# Patient Record
Sex: Female | Born: 1935
Health system: Southern US, Community
[De-identification: ages and names within clinical notes are randomized; demographics above are authoritative.]

## PROBLEM LIST (undated history)

## (undated) DIAGNOSIS — E785 Hyperlipidemia, unspecified: Secondary | ICD-10-CM

## (undated) DIAGNOSIS — I4891 Unspecified atrial fibrillation: Secondary | ICD-10-CM

## (undated) DIAGNOSIS — E039 Hypothyroidism, unspecified: Secondary | ICD-10-CM

## (undated) DIAGNOSIS — F0390 Unspecified dementia without behavioral disturbance: Secondary | ICD-10-CM

## (undated) DIAGNOSIS — F419 Anxiety disorder, unspecified: Secondary | ICD-10-CM

## (undated) DIAGNOSIS — J449 Chronic obstructive pulmonary disease, unspecified: Secondary | ICD-10-CM

## (undated) DIAGNOSIS — I5030 Unspecified diastolic (congestive) heart failure: Secondary | ICD-10-CM

## (undated) DIAGNOSIS — I10 Essential (primary) hypertension: Secondary | ICD-10-CM

## (undated) DIAGNOSIS — F329 Major depressive disorder, single episode, unspecified: Secondary | ICD-10-CM

## (undated) DIAGNOSIS — F32A Depression, unspecified: Secondary | ICD-10-CM

## (undated) DIAGNOSIS — I739 Peripheral vascular disease, unspecified: Secondary | ICD-10-CM

## (undated) DIAGNOSIS — I2510 Atherosclerotic heart disease of native coronary artery without angina pectoris: Secondary | ICD-10-CM

## (undated) HISTORY — DX: Unspecified atrial fibrillation: I48.91

## (undated) HISTORY — DX: Hypothyroidism, unspecified: E03.9

## (undated) HISTORY — DX: Essential (primary) hypertension: I10

## (undated) HISTORY — PX: ABDOMINAL HYSTERECTOMY: SHX81

## (undated) HISTORY — DX: Hyperlipidemia, unspecified: E78.5

## (undated) HISTORY — DX: Peripheral vascular disease, unspecified: I73.9

## (undated) HISTORY — DX: Anxiety disorder, unspecified: F41.9

## (undated) HISTORY — PX: CARDIAC SURGERY: SHX584

## (undated) HISTORY — PX: CARDIOVERSION: SHX1299

---

## 1983-06-07 HISTORY — PX: CORONARY ARTERY BYPASS GRAFT: SHX141

## 1983-06-07 HISTORY — PX: FEMORAL ARTERY STENT: SHX1583

## 2004-08-19 ENCOUNTER — Ambulatory Visit: Payer: Self-pay | Admitting: Vascular Surgery

## 2004-08-31 ENCOUNTER — Ambulatory Visit: Payer: Self-pay | Admitting: Vascular Surgery

## 2004-09-08 ENCOUNTER — Ambulatory Visit: Payer: Self-pay | Admitting: Vascular Surgery

## 2007-07-05 ENCOUNTER — Ambulatory Visit: Payer: Self-pay | Admitting: Gastroenterology

## 2008-04-16 ENCOUNTER — Encounter: Admission: RE | Admit: 2008-04-16 | Discharge: 2008-04-16 | Payer: Self-pay | Admitting: Cardiology

## 2010-06-06 HISTORY — PX: CATARACT EXTRACTION: SUR2

## 2011-06-20 ENCOUNTER — Ambulatory Visit: Payer: Self-pay | Admitting: Gastroenterology

## 2012-11-08 ENCOUNTER — Encounter: Payer: Self-pay | Admitting: Cardiothoracic Surgery

## 2012-11-08 ENCOUNTER — Encounter: Payer: Self-pay | Admitting: Nurse Practitioner

## 2012-11-26 ENCOUNTER — Ambulatory Visit: Payer: Self-pay | Admitting: Vascular Surgery

## 2012-11-26 LAB — CREATININE, SERUM: EGFR (African American): 59 — ABNORMAL LOW

## 2012-12-04 ENCOUNTER — Encounter: Payer: Self-pay | Admitting: Nurse Practitioner

## 2012-12-04 ENCOUNTER — Encounter: Payer: Self-pay | Admitting: Cardiothoracic Surgery

## 2013-09-30 ENCOUNTER — Observation Stay: Payer: Self-pay | Admitting: Internal Medicine

## 2013-09-30 LAB — CBC
HCT: 39.1 % (ref 35.0–47.0)
HGB: 13.3 g/dL (ref 12.0–16.0)
MCH: 31.9 pg (ref 26.0–34.0)
MCHC: 33.9 g/dL (ref 32.0–36.0)
MCV: 94 fL (ref 80–100)
PLATELETS: 230 10*3/uL (ref 150–440)
RBC: 4.16 10*6/uL (ref 3.80–5.20)
RDW: 13.2 % (ref 11.5–14.5)
WBC: 8.6 10*3/uL (ref 3.6–11.0)

## 2013-09-30 LAB — CK-MB: CK-MB: 1.7 ng/mL (ref 0.5–3.6)

## 2013-09-30 LAB — BASIC METABOLIC PANEL
Anion Gap: 8 (ref 7–16)
BUN: 18 mg/dL (ref 7–18)
Calcium, Total: 9.1 mg/dL (ref 8.5–10.1)
Chloride: 95 mmol/L — ABNORMAL LOW (ref 98–107)
Co2: 26 mmol/L (ref 21–32)
Creatinine: 0.9 mg/dL (ref 0.60–1.30)
EGFR (Non-African Amer.): 60 — ABNORMAL LOW
GLUCOSE: 103 mg/dL — AB (ref 65–99)
OSMOLALITY: 261 (ref 275–301)
Potassium: 4.5 mmol/L (ref 3.5–5.1)
Sodium: 129 mmol/L — ABNORMAL LOW (ref 136–145)

## 2013-09-30 LAB — TROPONIN I

## 2013-10-01 LAB — CBC WITH DIFFERENTIAL/PLATELET
Basophil #: 0 10*3/uL (ref 0.0–0.1)
Basophil %: 0.5 %
Eosinophil #: 0.1 10*3/uL (ref 0.0–0.7)
Eosinophil %: 0.9 %
HCT: 35.5 % (ref 35.0–47.0)
HGB: 11.9 g/dL — AB (ref 12.0–16.0)
LYMPHS ABS: 1.3 10*3/uL (ref 1.0–3.6)
Lymphocyte %: 20 %
MCH: 31.3 pg (ref 26.0–34.0)
MCHC: 33.7 g/dL (ref 32.0–36.0)
MCV: 93 fL (ref 80–100)
MONO ABS: 1 x10 3/mm — AB (ref 0.2–0.9)
MONOS PCT: 14.6 %
Neutrophil #: 4.3 10*3/uL (ref 1.4–6.5)
Neutrophil %: 64 %
PLATELETS: 177 10*3/uL (ref 150–440)
RBC: 3.81 10*6/uL (ref 3.80–5.20)
RDW: 12.6 % (ref 11.5–14.5)
WBC: 6.7 10*3/uL (ref 3.6–11.0)

## 2013-10-01 LAB — BASIC METABOLIC PANEL
Anion Gap: 4 — ABNORMAL LOW (ref 7–16)
BUN: 15 mg/dL (ref 7–18)
CALCIUM: 8.8 mg/dL (ref 8.5–10.1)
CHLORIDE: 100 mmol/L (ref 98–107)
CO2: 30 mmol/L (ref 21–32)
Creatinine: 0.8 mg/dL (ref 0.60–1.30)
EGFR (African American): >60
EGFR (Non-African Amer.): >60
Glucose: 111 mg/dL — ABNORMAL HIGH (ref 65–99)
OSMOLALITY: 270 (ref 275–301)
Potassium: 4.4 mmol/L (ref 3.5–5.1)
SODIUM: 134 mmol/L — AB (ref 136–145)

## 2013-10-01 LAB — CK-MB: CK-MB: 1.8 ng/mL (ref 0.5–3.6)

## 2013-10-01 LAB — TROPONIN I

## 2013-10-05 DIAGNOSIS — I10 Essential (primary) hypertension: Secondary | ICD-10-CM

## 2013-10-05 DIAGNOSIS — J449 Chronic obstructive pulmonary disease, unspecified: Secondary | ICD-10-CM

## 2013-10-05 DIAGNOSIS — I739 Peripheral vascular disease, unspecified: Secondary | ICD-10-CM

## 2013-10-05 DIAGNOSIS — I2510 Atherosclerotic heart disease of native coronary artery without angina pectoris: Secondary | ICD-10-CM

## 2013-10-05 HISTORY — DX: Essential (primary) hypertension: I10

## 2013-10-05 HISTORY — DX: Chronic obstructive pulmonary disease, unspecified: J44.9

## 2013-10-05 HISTORY — DX: Peripheral vascular disease, unspecified: I73.9

## 2013-10-05 HISTORY — DX: Atherosclerotic heart disease of native coronary artery without angina pectoris: I25.10

## 2014-07-28 DIAGNOSIS — J42 Unspecified chronic bronchitis: Secondary | ICD-10-CM | POA: Diagnosis not present

## 2014-07-28 DIAGNOSIS — E782 Mixed hyperlipidemia: Secondary | ICD-10-CM | POA: Diagnosis not present

## 2014-07-28 DIAGNOSIS — I25810 Atherosclerosis of coronary artery bypass graft(s) without angina pectoris: Secondary | ICD-10-CM | POA: Diagnosis not present

## 2014-08-11 DIAGNOSIS — E782 Mixed hyperlipidemia: Secondary | ICD-10-CM | POA: Diagnosis not present

## 2014-08-11 DIAGNOSIS — I10 Essential (primary) hypertension: Secondary | ICD-10-CM | POA: Diagnosis not present

## 2014-08-11 DIAGNOSIS — I739 Peripheral vascular disease, unspecified: Secondary | ICD-10-CM | POA: Diagnosis not present

## 2014-08-11 DIAGNOSIS — I25810 Atherosclerosis of coronary artery bypass graft(s) without angina pectoris: Secondary | ICD-10-CM | POA: Diagnosis not present

## 2014-09-26 NOTE — Op Note (Signed)
PATIENT NAME:  Holly Clark, Holly Clark MR#:  [id number] DATE OF BIRTH:  11-29-1929  DATE OF PROCEDURE:  11/26/2012  PREOPERATIVE DIAGNOSES:  1. Peripheral arterial disease with ulceration, right lower extremity.  2. Status post bilateral iliac interventions previously.  3. Hyperlipidemia.  4. Carotid artery stenosis.   POSTOPERATIVE DIAGNOSES:  1. Peripheral arterial disease with ulceration, right lower extremity.  2. Status post bilateral iliac interventions previously.  3. Hyperlipidemia.  4. Carotid artery stenosis.   PROCEDURE: 1. Ultrasound guidance for vascular access, left femoral artery.  2. Catheter placement into right popliteal artery from left femoral approach.  3. Aortogram and selective right lower extremity angiogram.  4. Percutaneous transluminal angioplasty of mid to distal superficial femoral artery down to the popliteal artery just above the knee with 4 mm diameter angioplasty balloon.  5. Self-expanding stent placement x2 for greater than 50% residual stenosis and dissection in the distal superficial femoral artery and above-knee popliteal artery.  6. StarClose closure device, left femoral artery.   SURGEON: Algernon Huxley, MD   ANESTHESIA: Local with moderate conscious sedation.   ESTIMATED BLOOD LOSS: 25 mL.   INDICATION FOR PROCEDURE: This is an 81 year old white female with severe peripheral vascular disease. She has undergone previous intervention. She has a nonhealing ulcer of the right lower extremity and right SFA stenosis. She is brought in for angiogram for further evaluation. Risks and benefits were discussed. Informed consent was obtained.   DESCRIPTION OF THE PROCEDURE: The patient is brought to the vascular interventional radiology suite. Groins were shaved and prepped, and a sterile surgical field was created. The left femoral head was localized with fluoroscopy. Ultrasound was used to access a patent left femoral artery, and a 5 French sheath was placed.  Pigtail catheter was placed in the aorta at the L1-L2 level, and AP aortogram was performed. This showed what appeared to be patent renal arteries. The aorta was mildly ectatic and maybe even slightly aneurysmal in the mid to distal portion. At the common iliac arteries bilaterally, there was stenosis that was in the mild-to-moderate range, approaching 50%, but did not appear flow limiting, and she had triphasic flow on her duplex in the iliacs previously. Her iliac stents that were below this were patent. Hooked the aortic bifurcation with a RIM catheter and advanced with a Terumo Advantage wire to the right femoral head, and selective right lower extremity angiogram was then performed. This showed an occlusion of the distal superficial femoral artery. She reconstituted an above-knee popliteal artery. There was disease both above and below this area for several centimeters. She then had what appeared to be 2-vessel runoff distally. A 6 French Ansel sheath was placed over a Terumo Advantage wire. I was able to cross the lesion without difficulty with the Terumo Advantage wire and a Kumpe catheter and confirm intraluminal flow in the popliteal artery. I then replaced the wire. She had small vessels, and a 4 mm diameter angioplasty balloon was inflated from just above the knee back up into the mid to distal superficial femoral artery, encompassing the area of occlusion and stenosis. Waists were taken which resolved. Completion angiogram following angioplasty showed significant residual stenosis and dissection throughout most of her angioplasty plane. For this reason, self-expanding stents were placed. We had used a 5 mm diameter by 12 cm length stent started just above the knee, and a second 5 mm diameter by 10 cm length stent was taken up to the mid to distal superficial femoral artery. This were postdilated  with a 4 mm balloon with an excellent angiographic completion result and maintained runoff distally. At this  point, I elected to terminate the procedure. The sheath was pulled back to the ipsilateral external iliac arteriogram, was and oblique arteriogram was performed. StarClose closure device was deployed in the usual fashion with excellent hemostatic result. The patient tolerated the procedure well and was taken to the recovery room in stable condition.   ____________________________ Algernon Huxley, MD jsd:OSi D: 11/26/2012 16:00:49 ET T: 11/27/2012 05:46:56 ET JOB#: [id number]  cc: Algernon Huxley, MD, <Dictator> Algernon Huxley MD ELECTRONICALLY SIGNED 11/29/2012 14:44

## 2014-09-27 NOTE — H&P (Signed)
PATIENT NAME:  Holly Clark, Holly Clark MR#:  [id number] DATE OF BIRTH:  07-23-1929  DATE OF ADMISSION:  09/30/2013  REFERRING PHYSICIAN: Dr. Jacqualine Code.   FAMILY PHYSICIAN: Dr. Emily Filbert.   REASON FOR ADMISSION: Chest pain, worrisome for unstable angina.   HISTORY OF PRESENT ILLNESS: The patient is an 81 year old female with history of coronary artery disease, status post CABG, benign hypertension, hyperlipidemia and COPD.  Also has peripheral vascular disease with lower extremity claudication. Presents to the Emergency Room with a 2 to 3 episodes of chest pain described as pressure and fullness associated with shortness of breath. Pain was improved with nitro. Came to the Emergency Room after her third episode. In the Emergency Room, the patient's initial cardiac enzymes were unremarkable and her EKG was nondiagnostic. She is now admitted for further evaluation.   PAST MEDICAL HISTORY: 1.  ASCVD, status post CABG.  2.  Benign hypertension.  3.  Hyperlipidemia.  4.  Chronic obstructive pulmonary disease.  5.  Peripheral vascular disease with lower extremity claudication.  6.  Status post bilateral iliac stents.   MEDICATIONS: 1.  Lexapro 5 mg p.o. daily.  2.  Zocor 20 mg p.o. at bedtime.  3.  Cardizem CD 120 mg p.o. daily.  4.  Levoxyl 100 mcg p.o. daily.  5.  Aspirin 81 mg p.o. daily.  6.  Xanax 0.25 mg p.o. at bedtime p.r.n.  7.  Nitrostat 0.4 mg sublingually p.r.n. chest pain.  8.  Micardis 40 mg p.o. daily.   ALLERGIES: HYCODAN.   SOCIAL HISTORY: The patient is divorced with 3 children. Has a history of tobacco abuse, but no history of alcohol abuse.   FAMILY HISTORY: Positive for cancer, sepsis and Parkinson disease.   REVIEW OF SYSTEMS: As per HPI.   PHYSICAL EXAMINATION: GENERAL: The patient is in no acute distress.  VITAL SIGNS: Currently remarkable for a blood pressure of 195/95 with a heart rate of 77, respiratory rate of 20, temperature of 98.3, sat 95% on room air.  HEENT:  Normocephalic, atraumatic. Pupils equally round and reactive to light and accommodation. Extraocular movements are intact. Sclerae anicteric. Conjunctivae are clear.  Oropharynx is clear.  NECK: Supple without JVD. No adenopathy or thyromegaly is noted.  LUNGS: Clear to auscultation and percussion without wheezes, rales or rhonchi. No dullness. Respiratory effort is normal.  CARDIAC: Regular rate and rhythm with normal S1, S2. No significant rubs, murmurs or gallops. PMI is nondisplaced. Chest wall is nontender.  ABDOMEN: Soft, nontender with normoactive bowel sounds. No organomegaly or masses were appreciated. No hernias or bruits were noted.  EXTREMITIES: Without clubbing, cyanosis, edema. Pulses were 1+ bilaterally.  SKIN: Warm and dry without rash or lesions.  NEUROLOGIC: Cranial nerves II through XII grossly intact. Deep tendon reflexes were symmetric. Motor and sensory exam is nonfocal.  PSYCHIATRIC: Revealed a patient who is alert and oriented to person, place and time. She was cooperative and used good judgment.   LABORATORY DATA: EKG revealed sinus rhythm with a right bundle branch block, but no acute ischemic changes. Chest x-ray revealed COPD, but was otherwise unremarkable. CBC was within normal limits. Her troponin was less than 0.02 with a glucose of 103 and a BUN of 18 and a creatinine of 0.9 with a sodium of 129.   ASSESSMENT: 1.  Chest pain, worrisome for unstable angina.  2.  Hyponatremia.  3.  Benign hypertension.  4.  Chronic obstructive pulmonary disease.  5.  Hyperlipidemia.  6.  Peripheral vascular disease.  PLAN: The patient will be observed on telemetry with Lovenox, aspirin and p.r.n. Zofran and morphine. We will begin oral nitrates at this time. Continue her other cardiac meds. We will follow serial cardiac enzymes and obtain an echocardiogram. We will consult cardiology for unstable angina. We will also consult vascular surgery for her peripheral vascular disease and  lower extremity claudication. Clear liquid diet for now, as the patient may require cardiac catheterization tomorrow. We will begin IV normal saline and follow up her sodium in the morning.   Further treatment and evaluation will depend upon the patient's progress.   Total spent on this patient was 50 minutes.     ____________________________ Leonie Douglas Doy Hutching, MD jds:dmm D: 09/30/2013 19:59:22 ET T: 09/30/2013 20:18:46 ET JOB#: [id number]  cc: Leonie Douglas. Doy Hutching, MD, <Dictator> miller JEFFREY Lennice Sites MD ELECTRONICALLY SIGNED 10/02/2013 8:03

## 2014-09-27 NOTE — Discharge Summary (Signed)
PATIENT NAME:  Holly Clark, Holly Clark MR#:  [id number] DATE OF BIRTH:  1929-08-19  DATE OF ADMISSION:  09/30/2013 DATE OF DISCHARGE:  10/01/2013  DISCHARGE DIAGNOSES:  1.  Atypical chest pain.  2.  Hypertension.  3.  Chronic obstructive pulmonary disease. 4.  Peripheral vascular disease.  5.  Hyponatremia.  DISCHARGE MEDICATIONS: Lexapro 5 mg daily, Zocor 20 mg at bedtime, Cardizem CD 120 mg daily, Synthroid 100 mcg daily, aspirin 81 mg daily, Xanax 0.5 mg at bedtime, Micardis 40 mg daily.   REASON FOR ADMISSION: An 81 year old female presents with chest pain.   HOSPITAL COURSE: The patient was admitted. Enzymes were negative. With atypical chest pain and known coronary artery disease, she underwent stress Myoview testing, which showed no signs of ischemia. She was asymptomatic without chest pain. Her sodium improved to 134 with hydration. She will be discharged on her usual medications to follow up with Dr. Emily Filbert. The etiology of her chest pain is unclear, it certainly could be pulmonary or musculoskeletal. She had no significant hypoxia really or dyspnea before or after hospitalization. Overall prognosis is guarded.  ____________________________ Rusty Aus, MD mfm:aw D: 10/02/2013 07:29:19 ET T: 10/02/2013 07:34:22 ET JOB#: [id number]  cc: Rusty Aus, MD, <Dictator> Alekxander Isola Roselee Culver MD ELECTRONICALLY SIGNED 10/02/2013 7:59

## 2015-05-18 ENCOUNTER — Emergency Department: Payer: Medicare Other

## 2015-05-18 ENCOUNTER — Encounter: Payer: Self-pay | Admitting: Emergency Medicine

## 2015-05-18 ENCOUNTER — Inpatient Hospital Stay
Admission: EM | Admit: 2015-05-18 | Discharge: 2015-05-22 | DRG: 480 | Disposition: A | Discharge status: Skilled Nursing Facility | Payer: Medicare Other | Attending: Internal Medicine | Admitting: Internal Medicine

## 2015-05-18 DIAGNOSIS — J449 Chronic obstructive pulmonary disease, unspecified: Secondary | ICD-10-CM | POA: Diagnosis present

## 2015-05-18 DIAGNOSIS — K5900 Constipation, unspecified: Secondary | ICD-10-CM | POA: Diagnosis present

## 2015-05-18 DIAGNOSIS — N170 Acute kidney failure with tubular necrosis: Secondary | ICD-10-CM | POA: Diagnosis not present

## 2015-05-18 DIAGNOSIS — Z7982 Long term (current) use of aspirin: Secondary | ICD-10-CM | POA: Diagnosis not present

## 2015-05-18 DIAGNOSIS — F17210 Nicotine dependence, cigarettes, uncomplicated: Secondary | ICD-10-CM | POA: Diagnosis present

## 2015-05-18 DIAGNOSIS — F329 Major depressive disorder, single episode, unspecified: Secondary | ICD-10-CM | POA: Diagnosis present

## 2015-05-18 DIAGNOSIS — S72001A Fracture of unspecified part of neck of right femur, initial encounter for closed fracture: Secondary | ICD-10-CM | POA: Diagnosis present

## 2015-05-18 DIAGNOSIS — J440 Chronic obstructive pulmonary disease with acute lower respiratory infection: Secondary | ICD-10-CM | POA: Diagnosis present

## 2015-05-18 DIAGNOSIS — Z79899 Other long term (current) drug therapy: Secondary | ICD-10-CM

## 2015-05-18 DIAGNOSIS — E039 Hypothyroidism, unspecified: Secondary | ICD-10-CM | POA: Diagnosis present

## 2015-05-18 DIAGNOSIS — R0902 Hypoxemia: Secondary | ICD-10-CM | POA: Diagnosis present

## 2015-05-18 DIAGNOSIS — F0390 Unspecified dementia without behavioral disturbance: Secondary | ICD-10-CM | POA: Diagnosis present

## 2015-05-18 DIAGNOSIS — Z23 Encounter for immunization: Secondary | ICD-10-CM

## 2015-05-18 DIAGNOSIS — W19XXXA Unspecified fall, initial encounter: Secondary | ICD-10-CM | POA: Diagnosis present

## 2015-05-18 DIAGNOSIS — Z951 Presence of aortocoronary bypass graft: Secondary | ICD-10-CM

## 2015-05-18 DIAGNOSIS — I959 Hypotension, unspecified: Secondary | ICD-10-CM | POA: Diagnosis not present

## 2015-05-18 DIAGNOSIS — I2510 Atherosclerotic heart disease of native coronary artery without angina pectoris: Secondary | ICD-10-CM | POA: Diagnosis present

## 2015-05-18 DIAGNOSIS — I10 Essential (primary) hypertension: Secondary | ICD-10-CM | POA: Diagnosis present

## 2015-05-18 DIAGNOSIS — Z681 Body mass index (BMI) 19 or less, adult: Secondary | ICD-10-CM

## 2015-05-18 DIAGNOSIS — S0083XA Contusion of other part of head, initial encounter: Secondary | ICD-10-CM | POA: Diagnosis present

## 2015-05-18 DIAGNOSIS — Z419 Encounter for procedure for purposes other than remedying health state, unspecified: Secondary | ICD-10-CM

## 2015-05-18 DIAGNOSIS — E871 Hypo-osmolality and hyponatremia: Secondary | ICD-10-CM | POA: Diagnosis present

## 2015-05-18 DIAGNOSIS — J209 Acute bronchitis, unspecified: Secondary | ICD-10-CM | POA: Diagnosis not present

## 2015-05-18 DIAGNOSIS — E785 Hyperlipidemia, unspecified: Secondary | ICD-10-CM | POA: Diagnosis present

## 2015-05-18 DIAGNOSIS — I739 Peripheral vascular disease, unspecified: Secondary | ICD-10-CM | POA: Diagnosis present

## 2015-05-18 DIAGNOSIS — R0602 Shortness of breath: Secondary | ICD-10-CM

## 2015-05-18 DIAGNOSIS — E860 Dehydration: Secondary | ICD-10-CM | POA: Diagnosis present

## 2015-05-18 HISTORY — DX: Chronic obstructive pulmonary disease, unspecified: J44.9

## 2015-05-18 HISTORY — DX: Major depressive disorder, single episode, unspecified: F32.9

## 2015-05-18 HISTORY — DX: Unspecified dementia, unspecified severity, without behavioral disturbance, psychotic disturbance, mood disturbance, and anxiety: F03.90

## 2015-05-18 HISTORY — DX: Hypothyroidism, unspecified: E03.9

## 2015-05-18 HISTORY — DX: Depression, unspecified: F32.A

## 2015-05-18 HISTORY — DX: Atherosclerotic heart disease of native coronary artery without angina pectoris: I25.10

## 2015-05-18 LAB — URINALYSIS COMPLETE WITH MICROSCOPIC (ARMC ONLY)
BACTERIA UA: NONE SEEN
Bilirubin Urine: NEGATIVE
Glucose, UA: NEGATIVE mg/dL
Leukocytes, UA: NEGATIVE
NITRITE: NEGATIVE
PH: 5 (ref 5.0–8.0)
PROTEIN: NEGATIVE mg/dL
SPECIFIC GRAVITY, URINE: 1.011 (ref 1.005–1.030)
Squamous Epithelial / LPF: NONE SEEN

## 2015-05-18 LAB — CBC WITH DIFFERENTIAL/PLATELET
BASOS ABS: 0 10*3/uL (ref 0–0.1)
BASOS PCT: 0 %
Eosinophils Absolute: 0.1 10*3/uL (ref 0–0.7)
Eosinophils Relative: 1 %
HEMATOCRIT: 38.2 % (ref 35.0–47.0)
Hemoglobin: 12.8 g/dL (ref 12.0–16.0)
LYMPHS PCT: 6 %
Lymphs Abs: 0.5 10*3/uL — ABNORMAL LOW (ref 1.0–3.6)
MCH: 30.8 pg (ref 26.0–34.0)
MCHC: 33.5 g/dL (ref 32.0–36.0)
MCV: 91.8 fL (ref 80.0–100.0)
MONO ABS: 1.4 10*3/uL — AB (ref 0.2–0.9)
Monocytes Relative: 19 %
NEUTROS ABS: 5.1 10*3/uL (ref 1.4–6.5)
NEUTROS PCT: 74 %
Platelets: 167 10*3/uL (ref 150–440)
RBC: 4.16 MIL/uL (ref 3.80–5.20)
RDW: 13.6 % (ref 11.5–14.5)
WBC: 7.1 10*3/uL (ref 3.6–11.0)

## 2015-05-18 LAB — COMPREHENSIVE METABOLIC PANEL
ALBUMIN: 4.1 g/dL (ref 3.5–5.0)
ALK PHOS: 98 U/L (ref 38–126)
ALT: 21 U/L (ref 14–54)
ANION GAP: 11 (ref 5–15)
AST: 28 U/L (ref 15–41)
BUN: 30 mg/dL — ABNORMAL HIGH (ref 6–20)
CALCIUM: 9.2 mg/dL (ref 8.9–10.3)
CO2: 24 mmol/L (ref 22–32)
Chloride: 96 mmol/L — ABNORMAL LOW (ref 101–111)
Creatinine, Ser: 1.5 mg/dL — ABNORMAL HIGH (ref 0.44–1.00)
GFR calc non Af Amer: 31 mL/min — ABNORMAL LOW (ref >60)
GFR, EST AFRICAN AMERICAN: 36 mL/min — AB (ref >60)
GLUCOSE: 122 mg/dL — AB (ref 65–99)
POTASSIUM: 4.7 mmol/L (ref 3.5–5.1)
Sodium: 131 mmol/L — ABNORMAL LOW (ref 135–145)
Total Bilirubin: 0.8 mg/dL (ref 0.3–1.2)
Total Protein: 7.3 g/dL (ref 6.5–8.1)

## 2015-05-18 LAB — SURGICAL PCR SCREEN
MRSA, PCR: NEGATIVE
STAPHYLOCOCCUS AUREUS: NEGATIVE

## 2015-05-18 LAB — PROTIME-INR
INR: 1
Prothrombin Time: 13.4 seconds (ref 11.4–15.0)

## 2015-05-18 MED ORDER — LEVOTHYROXINE SODIUM 50 MCG PO TABS
100.0000 ug | ORAL_TABLET | Freq: Every day | ORAL | Status: DC
  Administered 2015-05-21: 100 ug via ORAL
  Filled 2015-05-18: qty 2

## 2015-05-18 MED ORDER — UMECLIDINIUM-VILANTEROL 62.5-25 MCG/INH IN AEPB: 1.0000 | INHALATION_SPRAY | Freq: Every day | RESPIRATORY_TRACT | Status: DC

## 2015-05-18 MED ORDER — INFLUENZA VAC SPLIT QUAD 0.5 ML IM SUSY
0.5000 mL | PREFILLED_SYRINGE | INTRAMUSCULAR | Status: AC
  Administered 2015-05-20: 0.5 mL via INTRAMUSCULAR
  Filled 2015-05-18: qty 0.5

## 2015-05-18 MED ORDER — MOMETASONE FURO-FORMOTEROL FUM 100-5 MCG/ACT IN AERO
2.0000 | INHALATION_SPRAY | Freq: Two times a day (BID) | RESPIRATORY_TRACT | Status: DC
  Administered 2015-05-18 – 2015-05-22 (×7): 2 via RESPIRATORY_TRACT
  Filled 2015-05-18: qty 8.8

## 2015-05-18 MED ORDER — NITROGLYCERIN 0.4 MG SL SUBL: 0.4000 mg | SUBLINGUAL_TABLET | SUBLINGUAL | Status: DC | PRN

## 2015-05-18 MED ORDER — BUPROPION HCL 75 MG PO TABS
75.0000 mg | ORAL_TABLET | Freq: Every day | ORAL | Status: DC
  Administered 2015-05-20 – 2015-05-22 (×3): 75 mg via ORAL
  Filled 2015-05-18 (×4): qty 1

## 2015-05-18 MED ORDER — DONEPEZIL HCL 5 MG PO TABS
5.0000 mg | ORAL_TABLET | Freq: Every day | ORAL | Status: DC
  Administered 2015-05-18 – 2015-05-21 (×4): 5 mg via ORAL
  Filled 2015-05-18 (×4): qty 1

## 2015-05-18 MED ORDER — MORPHINE SULFATE (PF) 2 MG/ML IV SOLN
2.0000 mg | INTRAVENOUS | Status: DC | PRN
  Administered 2015-05-19: 2 mg via INTRAVENOUS
  Filled 2015-05-18: qty 1

## 2015-05-18 MED ORDER — TIOTROPIUM BROMIDE MONOHYDRATE 18 MCG IN CAPS
18.0000 ug | ORAL_CAPSULE | Freq: Every day | RESPIRATORY_TRACT | Status: DC
  Administered 2015-05-18 – 2015-05-22 (×4): 18 ug via RESPIRATORY_TRACT
  Filled 2015-05-18: qty 5

## 2015-05-18 MED ORDER — ACETAMINOPHEN 325 MG PO TABS
650.0000 mg | ORAL_TABLET | Freq: Four times a day (QID) | ORAL | Status: DC | PRN
  Administered 2015-05-19 – 2015-05-21 (×2): 650 mg via ORAL
  Filled 2015-05-18 (×2): qty 2

## 2015-05-18 MED ORDER — CEFAZOLIN (ANCEF) 1 G IV SOLR
1.0000 g | INTRAVENOUS | Status: DC
  Filled 2015-05-18: qty 1

## 2015-05-18 MED ORDER — HYDROCODONE-ACETAMINOPHEN 5-325 MG PO TABS
1.0000 | ORAL_TABLET | Freq: Four times a day (QID) | ORAL | Status: DC | PRN
  Administered 2015-05-20 – 2015-05-21 (×4): 1 via ORAL
  Administered 2015-05-22 (×2): 2 via ORAL
  Filled 2015-05-18 (×2): qty 1
  Filled 2015-05-18: qty 2
  Filled 2015-05-18 (×2): qty 1
  Filled 2015-05-18: qty 2

## 2015-05-18 MED ORDER — ACETAMINOPHEN 650 MG RE SUPP
650.0000 mg | Freq: Four times a day (QID) | RECTAL | Status: DC | PRN
Start: 2015-05-18 — End: 2015-05-22

## 2015-05-18 MED ORDER — DILTIAZEM HCL ER 120 MG PO CP24
120.0000 mg | ORAL_CAPSULE | Freq: Every day | ORAL | Status: DC
  Administered 2015-05-19 – 2015-05-22 (×3): 120 mg via ORAL
  Filled 2015-05-18 (×8): qty 1

## 2015-05-18 MED ORDER — SIMVASTATIN 20 MG PO TABS
20.0000 mg | ORAL_TABLET | Freq: Every day | ORAL | Status: DC
  Administered 2015-05-18: 20 mg via ORAL
  Filled 2015-05-18: qty 1

## 2015-05-18 MED ORDER — SODIUM CHLORIDE 0.9 % IV SOLN
INTRAVENOUS | Status: DC
Start: 2015-05-18 — End: 2015-05-20
  Administered 2015-05-18: 21:00:00 via INTRAVENOUS

## 2015-05-18 MED ORDER — IRBESARTAN 150 MG PO TABS
150.0000 mg | ORAL_TABLET | Freq: Every day | ORAL | Status: DC
  Administered 2015-05-19 – 2015-05-22 (×3): 150 mg via ORAL
  Filled 2015-05-18 (×4): qty 1

## 2015-05-18 NOTE — Consult Note (Signed)
81 year old white female fell 4 days ago at home landing on her knee. She been having difficulty walking since then but has been getting around her house to a degree. She came in to the emergency room with her family and was found to have an impacted femoral neck fracture and is being treated admitted for this. She has extensive peripheral vascular disease has multiple bypasses in the past. She is able to walk around her home normally till this incident. She does not get outside of her home very much.  Examination shows extensive ecchymosis on her face arms and right knee with small skin tears present as well. Her leg is not shortened or externally rotated. She is able flex extend her toes and sensation intact. I was unable to palpate pulses.  Radiographs show impacted femoral neck fracture with abnormal appearance to the bone probably related to osteoarthritis and osteopenia.  Impression: Right femoral neck fracture impacted and the patient was significant health problems.  Plan: Discussed treatment options with patient and family. I think with her peripheral vascular disease will and laboratory status is it had a preoperative level femoral neck pinning is probably preferable. If it can heal in this position she'll be better off than if she had a more extensive procedure with problems with wound healing as well as more extensive procedure with more blood loss. Also, with the fact that this is 61 days old and has not displaced pinning is likely to be successful despite her poor bone quality. The risks benefits and possible complications were discussed, in particular avascular necrosis also fixation and potential for subsequent surgery to revise to total hip if the surgery fails.

## 2015-05-18 NOTE — ED Notes (Signed)
Pt presents to ER with Fall that happened Thursday due to tripping over a cord and landed on concrete. Pt presents with facial bruising , right hip pain that she is unable to bear weigh on since then, and wounds on lower extremities.

## 2015-05-18 NOTE — ED Provider Notes (Signed)
CSN: [id number]     Arrival date & time 05/18/15  1515 History   First MD Initiated Contact with Patient 05/18/15 1525     Chief Complaint  Patient presents with  . Hip Injury  . Fall     (Consider location/radiation/quality/duration/timing/severity/associated sxs/prior Treatment) The history is provided by the patient.  Holly Clark is a 81 y.o. female hx of CAD s/p CABG, HL, here with fall. Patient was walking the dog and then she tripped over the cord 4 days ago. She landed on the right face as well as right hip. She states that she is unable to hear any weight on it since then. Her family noticed worsening bruising right face and chin. Brought her to PCP, who sent her for evaluation.     History reviewed. No pertinent past medical history. Past Surgical History  Procedure Laterality Date  . Cardiac surgery     History reviewed. No pertinent family history. Social History  Substance Use Topics  . Smoking status: Current Every Day Smoker    Types: Cigarettes  . Smokeless tobacco: None  . Alcohol Use: Yes   OB History    No data available     Review of Systems  Musculoskeletal:       R hip pain  Skin: Positive for color change and wound.  All other systems reviewed and are negative.     Allergies  Review of patient's allergies indicates no known allergies.  Home Medications   Prior to Admission medications   Medication Sig Start Date End Date Taking? Authorizing Provider  aspirin EC 81 MG tablet Take 81 mg by mouth daily.   Yes Historical Provider, MD  buPROPion (WELLBUTRIN) 75 MG tablet Take 75 mg by mouth daily.   Yes Historical Provider, MD  diltiazem (DILACOR XR) 120 MG 24 hr capsule Take 120 mg by mouth daily.   Yes Historical Provider, MD  donepezil (ARICEPT) 5 MG tablet Take 5 mg by mouth at bedtime.   Yes Historical Provider, MD  levothyroxine (SYNTHROID, LEVOTHROID) 100 MCG tablet Take 100 mcg by mouth daily before breakfast.   Yes Historical Provider,  MD  nitroGLYCERIN (NITROSTAT) 0.4 MG SL tablet Place 0.4 mg under the tongue every 5 (five) minutes as needed for chest pain.   Yes Historical Provider, MD  simvastatin (ZOCOR) 20 MG tablet Take 20 mg by mouth at bedtime.   Yes Historical Provider, MD  telmisartan (MICARDIS) 40 MG tablet Take 40 mg by mouth daily.   Yes Historical Provider, MD  Umeclidinium-Vilanterol (ANORO ELLIPTA) 62.5-25 MCG/INH AEPB Inhale 1 puff into the lungs daily.   Yes Historical Provider, MD   There were no vitals taken for this visit. Physical Exam  Constitutional: She is oriented to person, place, and time.  Chronically ill   HENT:  Head: Normocephalic.  Bruising around R eye, R chin   Eyes: Conjunctivae are normal. Pupils are equal, round, and reactive to light.  Neck: Normal range of motion. Neck supple.  Cardiovascular: Normal rate, regular rhythm and normal heart sounds.   Pulmonary/Chest: Effort normal and breath sounds normal. No respiratory distress. She has no wheezes. She has no rales.  Abdominal: Soft. Bowel sounds are normal. She exhibits no distension. There is no tenderness. There is no rebound.  Musculoskeletal:  R hip shortened, internally rotated. 2+ pulses. R knee with skin tears, minimally tender. R hand with abrasion, R 5th finger flexed (chronic), nontender   Neurological: She is alert and oriented to person,  place, and time.  Skin: Skin is warm and dry.  Psychiatric: She has a normal mood and affect. Her behavior is normal. Judgment and thought content normal.  Nursing note and vitals reviewed.   ED Course  Procedures (including critical care time) Labs Review Labs Reviewed  CBC WITH DIFFERENTIAL/PLATELET - Abnormal; Notable for the following:    Lymphs Abs 0.5 (*)    Monocytes Absolute 1.4 (*)    All other components within normal limits  COMPREHENSIVE METABOLIC PANEL - Abnormal; Notable for the following:    Sodium 131 (*)    Chloride 96 (*)    Glucose, Bld 122 (*)    BUN 30  (*)    Creatinine, Ser 1.50 (*)    GFR calc non Af Amer 31 (*)    GFR calc Af Amer 36 (*)    All other components within normal limits  PROTIME-INR  URINALYSIS COMPLETEWITH MICROSCOPIC ([facility] ONLY)  TYPE AND SCREEN    Imaging Review Dg Chest 1 View  05/18/2015  CLINICAL DATA:  Right hip pain since a fall four days ago. Right hip fracture. EXAM: CHEST 1 VIEW COMPARISON:  09/30/2013 FINDINGS: Heart size and pulmonary vascularity are normal and the lungs are clear. Prior CABG. Aortic atherosclerosis. Moderate hiatal hernia. No acute abnormalities. IMPRESSION: No acute abnormalities. Moderate hiatal hernia. Aortic atherosclerosis. Electronically Signed   By: Lorriane Shire M.D.   On: 05/18/2015 16:01   Ct Head Wo Contrast  05/18/2015  CLINICAL DATA:  Status post fall.  Facial bruising. EXAM: CT HEAD WITHOUT CONTRAST CT MAXILLOFACIAL WITHOUT CONTRAST CT CERVICAL SPINE WITHOUT CONTRAST TECHNIQUE: Multidetector CT imaging of the head, cervical spine, and maxillofacial structures were performed using the standard protocol without intravenous contrast. Multiplanar CT image reconstructions of the cervical spine and maxillofacial structures were also generated. COMPARISON:  None. FINDINGS: CT HEAD FINDINGS There is low attenuation within the subcortical and periventricular white matter compatible with chronic microvascular disease. Prominence of the sulci and ventricles identified consistent with brain atrophy. There is no evidence for acute cortical infarct, intracranial hemorrhage or mass. The paranasal sinuses and the mastoid air cells appear clear. The calvarium is intact. CT MAXILLOFACIAL FINDINGS The paranasal sinuses are clear. The nasal bone is intact. The zygomatic arches are intact. Mandible is located and intact. No evidence for orbital fracture. CT CERVICAL SPINE FINDINGS There is straightening of normal cervical lordosis. The vertebral body heights are well preserved. The prevertebral soft tissue  space appears normal. Multi level disc space narrowing and ventral endplate spurring is noted this is most advanced at C4-5 and C5-6. The facet joints appear all well aligned. No fractures or subluxations. Calcified left cervical lymph nodes identified. IMPRESSION: 1. No acute intracranial abnormalities. 2. Small vessel ischemic disease and brain atrophy. 3. No evidence for facial bone fracture. 4. No evidence for cervical spine fracture. 5. Cervical spondylosis. Electronically Signed   By: Kerby Moors M.D.   On: 05/18/2015 16:46   Ct Cervical Spine Wo Contrast  05/18/2015  CLINICAL DATA:  Status post fall.  Facial bruising. EXAM: CT HEAD WITHOUT CONTRAST CT MAXILLOFACIAL WITHOUT CONTRAST CT CERVICAL SPINE WITHOUT CONTRAST TECHNIQUE: Multidetector CT imaging of the head, cervical spine, and maxillofacial structures were performed using the standard protocol without intravenous contrast. Multiplanar CT image reconstructions of the cervical spine and maxillofacial structures were also generated. COMPARISON:  None. FINDINGS: CT HEAD FINDINGS There is low attenuation within the subcortical and periventricular white matter compatible with chronic microvascular disease. Prominence of the  sulci and ventricles identified consistent with brain atrophy. There is no evidence for acute cortical infarct, intracranial hemorrhage or mass. The paranasal sinuses and the mastoid air cells appear clear. The calvarium is intact. CT MAXILLOFACIAL FINDINGS The paranasal sinuses are clear. The nasal bone is intact. The zygomatic arches are intact. Mandible is located and intact. No evidence for orbital fracture. CT CERVICAL SPINE FINDINGS There is straightening of normal cervical lordosis. The vertebral body heights are well preserved. The prevertebral soft tissue space appears normal. Multi level disc space narrowing and ventral endplate spurring is noted this is most advanced at C4-5 and C5-6. The facet joints appear all well  aligned. No fractures or subluxations. Calcified left cervical lymph nodes identified. IMPRESSION: 1. No acute intracranial abnormalities. 2. Small vessel ischemic disease and brain atrophy. 3. No evidence for facial bone fracture. 4. No evidence for cervical spine fracture. 5. Cervical spondylosis. Electronically Signed   By: Kerby Moors M.D.   On: 05/18/2015 16:46   Dg Knee Complete 4 Views Right  05/18/2015  CLINICAL DATA:  Pt presents to ER with Fall that happened Thursday due to tripping over a cord and landed on concrete. Pt presents with facial bruising , right hip and knee pain that she is unable to bear weigh on since then, and wounds on lower extremities. EXAM: RIGHT KNEE - COMPLETE 4+ VIEW COMPARISON:  None. FINDINGS: No fracture or dislocation. There are prominent calcifications along the joint cartilage and menisci. The knee joint is normally spaced and aligned. Bones are diffusely demineralized. No joint effusion. There are vascular calcifications posteriorly as well as a vascular stent along the femoral artery. IMPRESSION: 1. No fracture or dislocation.  No acute finding. Electronically Signed   By: Lajean Manes M.D.   On: 05/18/2015 16:54   Dg Hip Unilat  With Pelvis 2-3 Views Right  05/18/2015  CLINICAL DATA:  Unable to bear weight. Right hip pain. Status post fall. EXAM: DG HIP (WITH OR WITHOUT PELVIS) 2-3V RIGHT COMPARISON:  None. FINDINGS: Impacted subcapital right hip fracture.  No dislocation. Generalized osteopenia.  No left hip fracture or dislocation. Peripheral vascular atherosclerotic disease. IMPRESSION: Impacted subcapital right hip fracture. Electronically Signed   By: Kathreen Devoid   On: 05/18/2015 15:59   Ct Maxillofacial Wo Cm  05/18/2015  CLINICAL DATA:  Status post fall.  Facial bruising. EXAM: CT HEAD WITHOUT CONTRAST CT MAXILLOFACIAL WITHOUT CONTRAST CT CERVICAL SPINE WITHOUT CONTRAST TECHNIQUE: Multidetector CT imaging of the head, cervical spine, and  maxillofacial structures were performed using the standard protocol without intravenous contrast. Multiplanar CT image reconstructions of the cervical spine and maxillofacial structures were also generated. COMPARISON:  None. FINDINGS: CT HEAD FINDINGS There is low attenuation within the subcortical and periventricular white matter compatible with chronic microvascular disease. Prominence of the sulci and ventricles identified consistent with brain atrophy. There is no evidence for acute cortical infarct, intracranial hemorrhage or mass. The paranasal sinuses and the mastoid air cells appear clear. The calvarium is intact. CT MAXILLOFACIAL FINDINGS The paranasal sinuses are clear. The nasal bone is intact. The zygomatic arches are intact. Mandible is located and intact. No evidence for orbital fracture. CT CERVICAL SPINE FINDINGS There is straightening of normal cervical lordosis. The vertebral body heights are well preserved. The prevertebral soft tissue space appears normal. Multi level disc space narrowing and ventral endplate spurring is noted this is most advanced at C4-5 and C5-6. The facet joints appear all well aligned. No fractures or subluxations. Calcified left  cervical lymph nodes identified. IMPRESSION: 1. No acute intracranial abnormalities. 2. Small vessel ischemic disease and brain atrophy. 3. No evidence for facial bone fracture. 4. No evidence for cervical spine fracture. 5. Cervical spondylosis. Electronically Signed   By: Kerby Moors M.D.   On: 05/18/2015 16:46   I have personally reviewed and evaluated these images and lab results as part of my medical decision-making.   EKG Interpretation None       ED ECG REPORT I, Hikaru Delorenzo, the attending physician, personally viewed and interpreted this ECG.   Date: 05/18/2015  EKG Time: 16:47  Rate: 84  Rhythm: normal EKG, normal sinus rhythm  Axis: normal  Intervals:none  ST&T Change: nonspecific    MDM   Final diagnoses:  None    Holly Clark is a 81 y.o. female here with fall. Concerned for hip fracture. Not on blood thinners but also has facial bruising so will get CT head/neck/face. Will get xrays and preop labs.   5:04 PM Xray showed hip fracture. Dr. Rudene Christians to operate on patient. Will get preop labs and admit to medicine.    Wandra Arthurs, MD 05/18/15 [phone]

## 2015-05-18 NOTE — H&P (Signed)
[facility] [place] NAME: Holly Clark    MR#:  [id number]  Mack OF BIRTH:  05-02-1930  DATE OF ADMISSION:  05/18/2015  PRIMARY CARE PHYSICIAN: Emily Filbert  REQUESTING/REFERRING PHYSICIAN: Shirlyn Goltz  CHIEF COMPLAINT:   Chief Complaint  Patient presents with  . Hip Injury  . Fall    HISTORY OF PRESENT ILLNESS:  Holly Clark  is a 81 y.o. female had a fall on Thursday. She has a Doberman pinscher and it was on the line and the dog came running inside and she got tangled by the line and ended up on the ground. Her son was able to pick her off the ground and got her on the couch. She is not been able to walk since the fall. She has severe pain in her right leg when the movements. She had bruising around the right chin and right eye with a fall. No loss of consciousness with the fall. Because of severe pain and unable to walk she came to the ER today.  PAST MEDICAL HISTORY:   Past Medical History  Diagnosis Date  . COPD (chronic obstructive pulmonary disease) ([place])   . Depression   . Hypothyroidism   . Coronary artery disease   . Dementia     PAST SURGICAL HISTORY:   Past Surgical History  Procedure Laterality Date  . Cardiac surgery    . Abdominal hysterectomy    . Coronary artery bypass graft      SOCIAL HISTORY:   Social History  Substance Use Topics  . Smoking status: Current Every Day Smoker    Types: Cigarettes  . Smokeless tobacco: Not on file  . Alcohol Use: Yes    FAMILY HISTORY:   Family History  Problem Relation Age of Onset  . Stroke Mother     DRUG ALLERGIES:  No Known Allergies  REVIEW OF SYSTEMS:  CONSTITUTIONAL: No fever, some fatigue.  EYES: No blurred or double vision.  EARS, NOSE, AND THROAT: No tinnitus or ear pain. No sore throat RESPIRATORY: No cough, shortness of breath, wheezing or hemoptysis.  CARDIOVASCULAR: No chest pain, orthopnea, edema.  GASTROINTESTINAL: No nausea,  vomiting, diarrhea or abdominal pain. No blood in bowel movements. Occasional constipation GENITOURINARY: No dysuria, hematuria.  ENDOCRINE: No polyuria, nocturia,  HEMATOLOGY: No anemia, easy bruising or bleeding SKIN: No rash or lesion. MUSCULOSKELETAL: Pain right hip   NEUROLOGIC: No tingling, numbness, weakness.  PSYCHIATRY: No anxiety or depression.   MEDICATIONS AT HOME:   Prior to Admission medications   Medication Sig Start Date End Date Taking? Authorizing Provider  aspirin EC 81 MG tablet Take 81 mg by mouth daily.   Yes Historical Provider, MD  buPROPion (WELLBUTRIN) 75 MG tablet Take 75 mg by mouth daily.   Yes Historical Provider, MD  diltiazem (DILACOR XR) 120 MG 24 hr capsule Take 120 mg by mouth daily.   Yes Historical Provider, MD  donepezil (ARICEPT) 5 MG tablet Take 5 mg by mouth at bedtime.   Yes Historical Provider, MD  levothyroxine (SYNTHROID, LEVOTHROID) 100 MCG tablet Take 100 mcg by mouth daily before breakfast.   Yes Historical Provider, MD  nitroGLYCERIN (NITROSTAT) 0.4 MG SL tablet Place 0.4 mg under the tongue every 5 (five) minutes as needed for chest pain.   Yes Historical Provider, MD  simvastatin (ZOCOR) 20 MG tablet Take 20 mg by mouth at bedtime.   Yes Historical Provider, MD  telmisartan (MICARDIS) 40 MG tablet Take  40 mg by mouth daily.   Yes Historical Provider, MD  Umeclidinium-Vilanterol (ANORO ELLIPTA) 62.5-25 MCG/INH AEPB Inhale 1 puff into the lungs daily.   Yes Historical Provider, MD      VITAL SIGNS:  There were no vitals taken for this visit.  PHYSICAL EXAMINATION:  GENERAL:  81 y.o.-year-old patient lying in the bed with no acute distress.  EYES: Pupils equal, round, reactive to light and accommodation. No scleral icterus. Extraocular muscles intact.  HEENT: Head atraumatic, normocephalic. Oropharynx and nasopharynx clear.  NECK:  Supple, no jugular venous distention. No thyroid enlargement, no tenderness.  LUNGS: Decreased breath  sounds bilaterally, no wheezing, rales,rhonchi or crepitation. No use of accessory muscles of respiration.  CARDIOVASCULAR: S1, S2 normal. 2/6 systolic murmur, no rubs, or gallops.  ABDOMEN: Soft, nontender, nondistended. Bowel sounds present. No organomegaly or mass.  EXTREMITIES: No pedal edema, cyanosis, or clubbing.  NEUROLOGIC: Cranial nerves II through XII are intact. Muscle strength 5/5 in all extremities. Sensation intact. Gait not checked.  PSYCHIATRIC: The patient is alert and oriented x 3.  SKIN: Bruising around the right eye and right chin. Only very slight bruising around the right hip.   LABORATORY PANEL:   CBC  Recent Labs Lab 05/18/15 1626  WBC 7.1  HGB 12.8  HCT 38.2  PLT 167   ------------------------------------------------------------------------------------------------------------------  Chemistries   Recent Labs Lab 05/18/15 1626  NA 131*  K 4.7  CL 96*  CO2 24  GLUCOSE 122*  BUN 30*  CREATININE 1.50*  CALCIUM 9.2  AST 28  ALT 21  ALKPHOS 98  BILITOT 0.8   ------------------------------------------------------------------------------------------------------------------    RADIOLOGY:  Dg Chest 1 View  05/18/2015  CLINICAL DATA:  Right hip pain since a fall four days ago. Right hip fracture. EXAM: CHEST 1 VIEW COMPARISON:  09/30/2013 FINDINGS: Heart size and pulmonary vascularity are normal and the lungs are clear. Prior CABG. Aortic atherosclerosis. Moderate hiatal hernia. No acute abnormalities. IMPRESSION: No acute abnormalities. Moderate hiatal hernia. Aortic atherosclerosis. Electronically Signed   By: Lorriane Shire M.D.   On: 05/18/2015 16:01   Ct Head Wo Contrast  05/18/2015  CLINICAL DATA:  Status post fall.  Facial bruising. EXAM: CT HEAD WITHOUT CONTRAST CT MAXILLOFACIAL WITHOUT CONTRAST CT CERVICAL SPINE WITHOUT CONTRAST TECHNIQUE: Multidetector CT imaging of the head, cervical spine, and maxillofacial structures were performed using  the standard protocol without intravenous contrast. Multiplanar CT image reconstructions of the cervical spine and maxillofacial structures were also generated. COMPARISON:  None. FINDINGS: CT HEAD FINDINGS There is low attenuation within the subcortical and periventricular white matter compatible with chronic microvascular disease. Prominence of the sulci and ventricles identified consistent with brain atrophy. There is no evidence for acute cortical infarct, intracranial hemorrhage or mass. The paranasal sinuses and the mastoid air cells appear clear. The calvarium is intact. CT MAXILLOFACIAL FINDINGS The paranasal sinuses are clear. The nasal bone is intact. The zygomatic arches are intact. Mandible is located and intact. No evidence for orbital fracture. CT CERVICAL SPINE FINDINGS There is straightening of normal cervical lordosis. The vertebral body heights are well preserved. The prevertebral soft tissue space appears normal. Multi level disc space narrowing and ventral endplate spurring is noted this is most advanced at C4-5 and C5-6. The facet joints appear all well aligned. No fractures or subluxations. Calcified left cervical lymph nodes identified. IMPRESSION: 1. No acute intracranial abnormalities. 2. Small vessel ischemic disease and brain atrophy. 3. No evidence for facial bone fracture. 4. No evidence for cervical  spine fracture. 5. Cervical spondylosis. Electronically Signed   By: Kerby Moors M.D.   On: 05/18/2015 16:46   Ct Cervical Spine Wo Contrast  05/18/2015  CLINICAL DATA:  Status post fall.  Facial bruising. EXAM: CT HEAD WITHOUT CONTRAST CT MAXILLOFACIAL WITHOUT CONTRAST CT CERVICAL SPINE WITHOUT CONTRAST TECHNIQUE: Multidetector CT imaging of the head, cervical spine, and maxillofacial structures were performed using the standard protocol without intravenous contrast. Multiplanar CT image reconstructions of the cervical spine and maxillofacial structures were also generated.  COMPARISON:  None. FINDINGS: CT HEAD FINDINGS There is low attenuation within the subcortical and periventricular white matter compatible with chronic microvascular disease. Prominence of the sulci and ventricles identified consistent with brain atrophy. There is no evidence for acute cortical infarct, intracranial hemorrhage or mass. The paranasal sinuses and the mastoid air cells appear clear. The calvarium is intact. CT MAXILLOFACIAL FINDINGS The paranasal sinuses are clear. The nasal bone is intact. The zygomatic arches are intact. Mandible is located and intact. No evidence for orbital fracture. CT CERVICAL SPINE FINDINGS There is straightening of normal cervical lordosis. The vertebral body heights are well preserved. The prevertebral soft tissue space appears normal. Multi level disc space narrowing and ventral endplate spurring is noted this is most advanced at C4-5 and C5-6. The facet joints appear all well aligned. No fractures or subluxations. Calcified left cervical lymph nodes identified. IMPRESSION: 1. No acute intracranial abnormalities. 2. Small vessel ischemic disease and brain atrophy. 3. No evidence for facial bone fracture. 4. No evidence for cervical spine fracture. 5. Cervical spondylosis. Electronically Signed   By: Kerby Moors M.D.   On: 05/18/2015 16:46   Dg Knee Complete 4 Views Right  05/18/2015  CLINICAL DATA:  Pt presents to ER with Fall that happened Thursday due to tripping over a cord and landed on concrete. Pt presents with facial bruising , right hip and knee pain that she is unable to bear weigh on since then, and wounds on lower extremities. EXAM: RIGHT KNEE - COMPLETE 4+ VIEW COMPARISON:  None. FINDINGS: No fracture or dislocation. There are prominent calcifications along the joint cartilage and menisci. The knee joint is normally spaced and aligned. Bones are diffusely demineralized. No joint effusion. There are vascular calcifications posteriorly as well as a vascular  stent along the femoral artery. IMPRESSION: 1. No fracture or dislocation.  No acute finding. Electronically Signed   By: Lajean Manes M.D.   On: 05/18/2015 16:54   Dg Hip Unilat  With Pelvis 2-3 Views Right  05/18/2015  CLINICAL DATA:  Unable to bear weight. Right hip pain. Status post fall. EXAM: DG HIP (WITH OR WITHOUT PELVIS) 2-3V RIGHT COMPARISON:  None. FINDINGS: Impacted subcapital right hip fracture.  No dislocation. Generalized osteopenia.  No left hip fracture or dislocation. Peripheral vascular atherosclerotic disease. IMPRESSION: Impacted subcapital right hip fracture. Electronically Signed   By: Kathreen Devoid   On: 05/18/2015 15:59   Ct Maxillofacial Wo Cm  05/18/2015  CLINICAL DATA:  Status post fall.  Facial bruising. EXAM: CT HEAD WITHOUT CONTRAST CT MAXILLOFACIAL WITHOUT CONTRAST CT CERVICAL SPINE WITHOUT CONTRAST TECHNIQUE: Multidetector CT imaging of the head, cervical spine, and maxillofacial structures were performed using the standard protocol without intravenous contrast. Multiplanar CT image reconstructions of the cervical spine and maxillofacial structures were also generated. COMPARISON:  None. FINDINGS: CT HEAD FINDINGS There is low attenuation within the subcortical and periventricular white matter compatible with chronic microvascular disease. Prominence of the sulci and ventricles identified consistent with  brain atrophy. There is no evidence for acute cortical infarct, intracranial hemorrhage or mass. The paranasal sinuses and the mastoid air cells appear clear. The calvarium is intact. CT MAXILLOFACIAL FINDINGS The paranasal sinuses are clear. The nasal bone is intact. The zygomatic arches are intact. Mandible is located and intact. No evidence for orbital fracture. CT CERVICAL SPINE FINDINGS There is straightening of normal cervical lordosis. The vertebral body heights are well preserved. The prevertebral soft tissue space appears normal. Multi level disc space narrowing and  ventral endplate spurring is noted this is most advanced at C4-5 and C5-6. The facet joints appear all well aligned. No fractures or subluxations. Calcified left cervical lymph nodes identified. IMPRESSION: 1. No acute intracranial abnormalities. 2. Small vessel ischemic disease and brain atrophy. 3. No evidence for facial bone fracture. 4. No evidence for cervical spine fracture. 5. Cervical spondylosis. Electronically Signed   By: Kerby Moors M.D.   On: 05/18/2015 16:46    EKG:   Sinus rhythm 87 bpm with intraventricular conduction delay.  IMPRESSION AND PLAN:   1. Right hip fracture closed initial encounter. The fracture occurred a few days ago. Patient was seen in consultation by Dr. Rudene Christians will take to the operating room tomorrow. No contraindications to surgery at this time. 2. Dehydration, hyponatremia- give gentle IV fluid hydration and check her creatinine again in the morning. 3. Essential hypertension- continue usual medications. 4. Hypothyroidism unspecified continue levothyroxine 5. Hyperlipidemia unspecified continue statin 6. History of coronary artery disease- no chest pain or shortness of breath with activity. Hold aspirin today. 7. Depression continue Wellbutrin 8. Mild dementia continue Aricept 9. Send urine analysis  All the records are reviewed and case discussed with ED provider. Management plans discussed with the patient, family and they are in agreement.  CODE STATUS: Patient would like to be a full code but would not like to be on ventilator long-term.  TOTAL TIME TAKING CARE OF THIS PATIENT: 50 minutes.    Loletha Grayer M.D on 05/18/2015 at 5:45 PM  Between 7am to 6pm - Pager - [phone]  After 6pm call admission pager [facility]  Office  [phone]  CC: Primary care physician; Emily Filbert

## 2015-05-19 ENCOUNTER — Inpatient Hospital Stay: Payer: Medicare Other

## 2015-05-19 ENCOUNTER — Inpatient Hospital Stay: Payer: Medicare Other | Admitting: Anesthesiology

## 2015-05-19 ENCOUNTER — Encounter: Payer: Self-pay | Admitting: *Deleted

## 2015-05-19 ENCOUNTER — Encounter
Admission: EM | Disposition: A | Discharge status: Skilled Nursing Facility | Payer: Self-pay | Source: Home / Self Care | Attending: Internal Medicine

## 2015-05-19 HISTORY — PX: HIP PINNING,CANNULATED: SHX1758

## 2015-05-19 LAB — BASIC METABOLIC PANEL
Anion gap: 6 (ref 5–15)
BUN: 24 mg/dL — AB (ref 6–20)
CHLORIDE: 104 mmol/L (ref 101–111)
CO2: 24 mmol/L (ref 22–32)
CREATININE: 1.21 mg/dL — AB (ref 0.44–1.00)
Calcium: 8.8 mg/dL — ABNORMAL LOW (ref 8.9–10.3)
GFR calc Af Amer: 47 mL/min — ABNORMAL LOW (ref >60)
GFR calc non Af Amer: 41 mL/min — ABNORMAL LOW (ref >60)
GLUCOSE: 106 mg/dL — AB (ref 65–99)
POTASSIUM: 4.3 mmol/L (ref 3.5–5.1)
SODIUM: 134 mmol/L — AB (ref 135–145)

## 2015-05-19 LAB — CBC
HEMATOCRIT: 36 % (ref 35.0–47.0)
Hemoglobin: 12 g/dL (ref 12.0–16.0)
MCH: 30.4 pg (ref 26.0–34.0)
MCHC: 33.3 g/dL (ref 32.0–36.0)
MCV: 91.2 fL (ref 80.0–100.0)
PLATELETS: 168 10*3/uL (ref 150–440)
RBC: 3.95 MIL/uL (ref 3.80–5.20)
RDW: 13.3 % (ref 11.5–14.5)
WBC: 5.4 10*3/uL (ref 3.6–11.0)

## 2015-05-19 LAB — ABO/RH: ABO/RH(D): O POS

## 2015-05-19 SURGERY — FIXATION, FEMUR, NECK, PERCUTANEOUS, USING SCREW
Anesthesia: Spinal | Laterality: Right | Wound class: Clean

## 2015-05-19 MED ORDER — PROPOFOL 10 MG/ML IV BOLUS
INTRAVENOUS | Status: DC | PRN
  Administered 2015-05-19: 30 mg via INTRAVENOUS
  Administered 2015-05-19: 20 mg via INTRAVENOUS

## 2015-05-19 MED ORDER — ONDANSETRON HCL 4 MG PO TABS: 4.0000 mg | ORAL_TABLET | Freq: Four times a day (QID) | ORAL | Status: DC | PRN

## 2015-05-19 MED ORDER — MENTHOL 3 MG MT LOZG: 1.0000 | LOZENGE | OROMUCOSAL | Status: DC | PRN

## 2015-05-19 MED ORDER — ATORVASTATIN CALCIUM 10 MG PO TABS
10.0000 mg | ORAL_TABLET | Freq: Every day | ORAL | Status: DC
  Administered 2015-05-19 – 2015-05-21 (×3): 10 mg via ORAL
  Filled 2015-05-19 (×3): qty 1

## 2015-05-19 MED ORDER — ALUM & MAG HYDROXIDE-SIMETH 200-200-20 MG/5ML PO SUSP: 30.0000 mL | ORAL | Status: DC | PRN

## 2015-05-19 MED ORDER — METOCLOPRAMIDE HCL 5 MG PO TABS: 5.0000 mg | ORAL_TABLET | Freq: Three times a day (TID) | ORAL | Status: DC | PRN

## 2015-05-19 MED ORDER — MAGNESIUM CITRATE PO SOLN: 1.0000 | Freq: Once | ORAL | Status: DC | PRN

## 2015-05-19 MED ORDER — CEFAZOLIN SODIUM 1-5 GM-% IV SOLN
1.0000 g | Freq: Once | INTRAVENOUS | Status: DC
  Filled 2015-05-19: qty 50

## 2015-05-19 MED ORDER — MAGNESIUM HYDROXIDE 400 MG/5ML PO SUSP
30.0000 mL | Freq: Every day | ORAL | Status: DC | PRN
  Administered 2015-05-20: 30 mL via ORAL
  Filled 2015-05-19: qty 30

## 2015-05-19 MED ORDER — PROPOFOL 500 MG/50ML IV EMUL
INTRAVENOUS | Status: DC | PRN
  Administered 2015-05-19: 60 ug/kg/min via INTRAVENOUS

## 2015-05-19 MED ORDER — ENOXAPARIN SODIUM 30 MG/0.3ML ~~LOC~~ SOLN
30.0000 mg | SUBCUTANEOUS | Status: DC
  Administered 2015-05-20 – 2015-05-22 (×3): 30 mg via SUBCUTANEOUS
  Filled 2015-05-19 (×3): qty 0.3

## 2015-05-19 MED ORDER — FENTANYL CITRATE (PF) 100 MCG/2ML IJ SOLN: 25.0000 ug | INTRAMUSCULAR | Status: DC | PRN

## 2015-05-19 MED ORDER — NEOMYCIN-POLYMYXIN B GU 40-200000 IR SOLN
Status: DC | PRN
  Administered 2015-05-19: 2 mL

## 2015-05-19 MED ORDER — PHENYLEPHRINE HCL 10 MG/ML IJ SOLN
INTRAMUSCULAR | Status: DC | PRN
  Administered 2015-05-19: 100 ug via INTRAVENOUS

## 2015-05-19 MED ORDER — MIDAZOLAM HCL 5 MG/5ML IJ SOLN
INTRAMUSCULAR | Status: DC | PRN
  Administered 2015-05-19 (×2): 1 mg via INTRAVENOUS

## 2015-05-19 MED ORDER — BUPIVACAINE HCL (PF) 0.5 % IJ SOLN
INTRAMUSCULAR | Status: DC | PRN
Start: 2015-05-19 — End: 2015-05-19
  Administered 2015-05-19: 3 mL

## 2015-05-19 MED ORDER — PHENOL 1.4 % MT LIQD: 1.0000 | OROMUCOSAL | Status: DC | PRN

## 2015-05-19 MED ORDER — ONDANSETRON HCL 4 MG/2ML IJ SOLN: 4.0000 mg | Freq: Once | INTRAMUSCULAR | Status: DC | PRN

## 2015-05-19 MED ORDER — DOCUSATE SODIUM 100 MG PO CAPS
100.0000 mg | ORAL_CAPSULE | Freq: Two times a day (BID) | ORAL | Status: DC
  Administered 2015-05-19 – 2015-05-22 (×6): 100 mg via ORAL
  Filled 2015-05-19 (×6): qty 1

## 2015-05-19 MED ORDER — ONDANSETRON HCL 4 MG/2ML IJ SOLN
4.0000 mg | Freq: Four times a day (QID) | INTRAMUSCULAR | Status: DC | PRN
  Administered 2015-05-19 – 2015-05-22 (×2): 4 mg via INTRAVENOUS
  Filled 2015-05-19 (×2): qty 2

## 2015-05-19 MED ORDER — MORPHINE SULFATE (PF) 2 MG/ML IV SOLN
2.0000 mg | INTRAVENOUS | Status: DC | PRN
Start: 2015-05-19 — End: 2015-05-22
  Administered 2015-05-19 (×3): 2 mg via INTRAVENOUS
  Filled 2015-05-19 (×3): qty 1

## 2015-05-19 MED ORDER — METOCLOPRAMIDE HCL 5 MG/ML IJ SOLN: 5.0000 mg | Freq: Three times a day (TID) | INTRAMUSCULAR | Status: DC | PRN

## 2015-05-19 MED ORDER — FUROSEMIDE 10 MG/ML IJ SOLN
40.0000 mg | Freq: Once | INTRAMUSCULAR | Status: AC
  Administered 2015-05-19: 40 mg via INTRAVENOUS
  Filled 2015-05-19: qty 4

## 2015-05-19 MED ORDER — BISACODYL 10 MG RE SUPP
10.0000 mg | Freq: Every day | RECTAL | Status: DC | PRN
  Administered 2015-05-21: 10 mg via RECTAL
  Filled 2015-05-19: qty 1

## 2015-05-19 MED ORDER — SODIUM CHLORIDE 0.9 % IV SOLN
INTRAVENOUS | Status: DC
  Administered 2015-05-19: 16:00:00 via INTRAVENOUS

## 2015-05-19 MED ORDER — CEFAZOLIN SODIUM 1-5 GM-% IV SOLN
1.0000 g | Freq: Four times a day (QID) | INTRAVENOUS | Status: AC
  Administered 2015-05-19 – 2015-05-20 (×3): 1 g via INTRAVENOUS
  Filled 2015-05-19 (×5): qty 50

## 2015-05-19 SURGICAL SUPPLY — 34 items
BIT DRILL CANN LRG QC 5X300 (BIT) ×3 IMPLANT
BNDG COHESIVE 4X5 TAN STRL (GAUZE/BANDAGES/DRESSINGS) ×6 IMPLANT
CANISTER SUCT 1200ML W/VALVE (MISCELLANEOUS) ×3 IMPLANT
CHLORAPREP W/TINT 26ML (MISCELLANEOUS) ×3 IMPLANT
DRESSING ALLEVYN LIFE SACRUM (GAUZE/BANDAGES/DRESSINGS) ×3 IMPLANT
DRSG TEGADERM 6X8 (GAUZE/BANDAGES/DRESSINGS) IMPLANT
GAUZE PETRO XEROFOAM 1X8 (MISCELLANEOUS) IMPLANT
GAUZE SPONGE 4X4 12PLY STRL (GAUZE/BANDAGES/DRESSINGS) ×3 IMPLANT
GLOVE BIOGEL PI IND STRL 9 (GLOVE) ×1 IMPLANT
GLOVE BIOGEL PI INDICATOR 9 (GLOVE) ×2
GLOVE SURG ORTHO 9.0 STRL STRW (GLOVE) ×3 IMPLANT
GOWN SPECIALTY ULTRA XL (MISCELLANEOUS) ×3 IMPLANT
GOWN STRL REUS W/ TWL LRG LVL3 (GOWN DISPOSABLE) ×1 IMPLANT
GOWN STRL REUS W/TWL LRG LVL3 (GOWN DISPOSABLE) ×2
GUIDEWIRE THREADED 2.8 (WIRE) ×9 IMPLANT
KIT RM TURNOVER STRD PROC AR (KITS) ×3 IMPLANT
NEEDLE FILTER BLUNT 18X 1/2SAF (NEEDLE) ×2
NEEDLE FILTER BLUNT 18X1 1/2 (NEEDLE) ×1 IMPLANT
NS IRRIG 500ML POUR BTL (IV SOLUTION) ×3 IMPLANT
PACK HIP COMPR (MISCELLANEOUS) ×3 IMPLANT
PAD GROUND ADULT SPLIT (MISCELLANEOUS) ×3 IMPLANT
SCREW CANN 32 THRD/75 7.3 (Screw) ×3 IMPLANT
SCREW CANN 32 THRD/80 7.3 (Screw) ×3 IMPLANT
SCREW CANN THREADED 7.3X85 (Screw) ×3 IMPLANT
SCREW LOCK (Screw) ×6 IMPLANT
SCREW LOCK FT 75X4.5XSLD ST NS (Screw) ×3 IMPLANT
STAPLER SKIN PROX 35W (STAPLE) ×3 IMPLANT
SUT PROLENE 2 0 FS (SUTURE) IMPLANT
SUT VIC AB 2-0 SH 27 (SUTURE) ×2
SUT VIC AB 2-0 SH 27XBRD (SUTURE) ×1 IMPLANT
SYR 5ML LL (SYRINGE) ×3 IMPLANT
TAPE MICROFOAM 4IN (TAPE) ×3 IMPLANT
TUBING CONNECTING 10 (TUBING) ×4 IMPLANT
TUBING CONNECTING 10' (TUBING) ×2

## 2015-05-19 NOTE — Progress Notes (Signed)
ANTICOAGULATION CONSULT NOTE - Initial Consult  Pharmacy Consult for Lovenox  Indication: VTE prophylaxis  No Known Allergies  Patient Measurements: Height: 5' (152.4 cm) Weight: 100 lb (45.36 kg) IBW/kg (Calculated) : 45.5 Heparin Dosing Weight:   Vital Signs: Temp: 97.8 F (36.6 C) (12/13 1520) Temp Source: Oral (12/13 1520) BP: 154/55 mmHg (12/13 1520) Pulse Rate: 90 (12/13 1520)  Labs:  Recent Labs  05/18/15 1626 05/19/15 0535  HGB 12.8 12.0  HCT 38.2 36.0  PLT 167 168  LABPROT 13.4  --   INR 1.00  --   CREATININE 1.50* 1.21*    Estimated Creatinine Clearance: 25.7 mL/min (by C-G formula based on Cr of 1.21).   Medical History: Past Medical History  Diagnosis Date  . COPD (chronic obstructive pulmonary disease) ([place])   . Depression   . Hypothyroidism   . Coronary artery disease   . Dementia     Medications:  Scheduled:  . atorvastatin  10 mg Oral QHS  . buPROPion  75 mg Oral Daily  .  ceFAZolin (ANCEF) IV  1 g Intravenous Q6H  . diltiazem  120 mg Oral Daily  . docusate sodium  100 mg Oral BID  . donepezil  5 mg Oral QHS  . [START ON 05/20/2015] enoxaparin (LOVENOX) injection  30 mg Subcutaneous Q24H  . Influenza vac split quadrivalent PF  0.5 mL Intramuscular Tomorrow-1000  . irbesartan  150 mg Oral Daily  . levothyroxine  100 mcg Oral QAC breakfast  . mometasone-formoterol  2 puff Inhalation BID  . tiotropium  18 mcg Inhalation Daily    Assessment: CrCl = 25.7 ml/min  Goal of Therapy:  DVT prophylaxis   Plan:  Lovenox 40 mg SQ Q24H originally ordered.  Will adjust dose to lovenox 30 mg SQ Q24H based on CrCl < 30 ml/min.  Holly Clark D 05/19/2015,3:37 PM

## 2015-05-19 NOTE — NC FL2 (Signed)
[place] LEVEL OF CARE SCREENING TOOL     IDENTIFICATION  Patient Name: Holly Clark Birthdate: 05-Jan-1930 Sex: female Admission Date (Current Location): 05/18/2015  [place] and [place] Number:  [facility] )   Facility and Address:  [facility], [address] [place], [place] [address]      Provider Number: [id number]  Attending Physician Name and Address:  Epifanio Lesches, MD  Relative Name and Phone Number:       Current Level of Care: Hospital Recommended Level of Care: [place] Prior Approval Number:    Date Approved/Denied:   PASRR Number:  [id number])  Discharge Plan: SNF    Current Diagnoses: Patient Active Problem List   Diagnosis Date Noted  . Hip fracture, right ([place]) 05/18/2015    Orientation RESPIRATION BLADDER Height & Weight    Self, Time, Situation, Place  Normal Continent 5' (152.4 cm) 104 lbs.  BEHAVIORAL SYMPTOMS/MOOD NEUROLOGICAL BOWEL NUTRITION STATUS   (none )  (none ) Continent Diet (NPO for surgery )  AMBULATORY STATUS COMMUNICATION OF NEEDS Skin   Extensive Assist Verbally Surgical wounds, Skin abrasions (Right Knee Skin Tear)                       Personal Care Assistance Level of Assistance  Bathing, Feeding, Dressing Bathing Assistance: Limited assistance Feeding assistance: Independent Dressing Assistance: Limited assistance     Functional Limitations Info  Sight, Hearing, Speech Sight Info: Adequate Hearing Info: Adequate Speech Info: Adequate    SPECIAL CARE FACTORS FREQUENCY  PT (By licensed PT), OT (By licensed OT)     PT Frequency:  (5) OT Frequency:  (5)            Contractures      Additional Factors Info  Code Status Code Status Info:  (Full Code. )             Current Medications (05/19/2015):  This is the current hospital active medication list Current Facility-Administered Medications  Medication Dose Route Frequency  Provider Last Rate Last Dose  . 0.9 %  sodium chloride infusion   Intravenous Continuous Loletha Grayer, MD 40 mL/hr at 05/18/15 2056    . acetaminophen (TYLENOL) tablet 650 mg  650 mg Oral Q6H PRN Loletha Grayer, MD       Or  . acetaminophen (TYLENOL) suppository 650 mg  650 mg Rectal Q6H PRN Loletha Grayer, MD      . atorvastatin (LIPITOR) tablet 10 mg  10 mg Oral QHS Loletha Grayer, MD      . buPROPion [facility]) tablet 75 mg  75 mg Oral Daily Loletha Grayer, MD   75 mg at 05/19/15 5852  . ceFAZolin (ANCEF) powder 1 g  1 g Other To OR Hessie Knows, MD      . diltiazem (DILACOR XR) 24 hr capsule 120 mg  120 mg Oral Daily Loletha Grayer, MD   120 mg at 05/19/15 0824  . donepezil (ARICEPT) tablet 5 mg  5 mg Oral QHS Loletha Grayer, MD   5 mg at 05/18/15 2058  . HYDROcodone-acetaminophen (NORCO/VICODIN) 5-325 MG per tablet 1-2 tablet  1-2 tablet Oral Q6H PRN Hessie Knows, MD      . Influenza vac split quadrivalent PF (FLUARIX) injection 0.5 mL  0.5 mL Intramuscular Tomorrow-1000 Loletha Grayer, MD      . irbesartan [facility]) tablet 150 mg  150 mg Oral Daily Loletha Grayer, MD   150 mg at 05/19/15 0824  .  levothyroxine (SYNTHROID, LEVOTHROID) tablet 100 mcg  100 mcg Oral QAC breakfast Loletha Grayer, MD   100 mcg at 05/19/15 0748  . mometasone-formoterol (DULERA) 100-5 MCG/ACT inhaler 2 puff  2 puff Inhalation BID Loletha Grayer, MD   2 puff at 05/18/15 2057  . morphine 2 MG/ML injection 2 mg  2 mg Intravenous Q2H PRN Hessie Knows, MD      . nitroGLYCERIN (NITROSTAT) SL tablet 0.4 mg  0.4 mg Sublingual Q5 min PRN Loletha Grayer, MD      . tiotropium [facility]) inhalation capsule 18 mcg  18 mcg Inhalation Daily Loletha Grayer, MD   18 mcg at 05/18/15 2056     Discharge Medications: Please see discharge summary for a list of discharge medications.  Relevant Imaging Results:  Relevant Lab Results:   Additional Information  (SSN: [id number])  Loralyn Freshwater, LCSW

## 2015-05-19 NOTE — Care Management Note (Signed)
Case Management Note  Patient Details  Name: ARLESIA KIEL MRN: [id number] Date of Birth: [birth year]  Subjective/Objective:  Admitted with right hip fracture. Surgery today for pinning. Met with patient and her 2 daughters at bedside. Patient states she lives at home with her son who is with her 24 hours a day. He has epilepsy and is at times forgetful.   Her bedroom is upstairs which are 14 stairs.  She uses no DME and has no equipment at home. [facility]= [facility]. Patient states she lives a block away from [place] and would like to go there if she needs STR. CSW updated. Daughter, Lenoria Farrier contact number is [phone].  Provided daughters with a list of home health agencies and CM contact card.  Process of surgery and PT follow up explained.               Action/Plan: Follow up s/p surgery and PT.   Expected Discharge Date:                  Expected Discharge Plan:  [place]  In-House Referral:     Discharge planning Services  CM Consult  Post Acute Care Choice:    Choice offered to:  Patient, Adult Children  DME Arranged:    DME Agency:     HH Arranged:    [place] Agency:     Status of Service:  In process, will continue to follow  Medicare Important Message Given:    Date Medicare IM Given:    Medicare IM give by:    Date Additional Medicare IM Given:    Additional Medicare Important Message give by:     If discussed at [place] of Stay Meetings, dates discussed:    Additional Comments:  Jolly Mango, RN 05/19/2015, 9:54 AM

## 2015-05-19 NOTE — Op Note (Signed)
05/18/2015 - 05/19/2015  3:23 PM  PATIENT:  Holly Clark  81 y.o. female  PRE-OPERATIVE DIAGNOSIS:  RIGHT HIP FRACTURE  POST-OPERATIVE DIAGNOSIS:  RIGHT HIP FRACTURE  PROCEDURE:  Procedure(s): CANNULATED HIP PINNING (Right)  SURGEON: Laurene Footman, MD  ASSISTANTS: None  ANESTHESIA:   spinal  EBL:  Total I/O In: 5852 [I.V.:1000; Other:475] Out: -   BLOOD ADMINISTERED:none  DRAINS: none   LOCAL MEDICATIONS USED:  NONE  SPECIMEN:  No Specimen  DISPOSITION OF SPECIMEN:  N/A  COUNTS:  YES  TOURNIQUET:  * No tourniquets in log *  IMPLANTS: 7.3 AO cannulated screws  DICTATION: .Dragon Dictation Patient was brought to the operating room and after spinal anesthesia was obtained patient transferred to the fracture table. Left leg in the well-leg holder. Right foot in the traction boot. C-arm was brought in and good visualization in AP lateral projections was obtained. Hip was prepped and draped using a Barrier drape method. After patient identification and timeout procedure were completed approximately 1 inch incision was made laterally. The subcutaneous tissue was spread and a K wire inserted into the femoral head and essentially center center position 2 additional guidewires were then inserted slightly anterior and interference slight posterior and superior position checked on both AP and lateral imaging. Next measurements were made off of these there were drilled and tapped and then cannulated screws 7.3 diameter from the Synthes set inserted. Permanent C-arm views were obtained showing appropriate position of the screws and guidewires were then removed through wounds irrigated and the wound closed with 2-0 Vicryl substantially and skin staples. Xeroform and a honeycomb dressing applied  PLAN OF CARE: Continued as inpatient  PATIENT DISPOSITION:  PACU - hemodynamically stable.

## 2015-05-19 NOTE — Progress Notes (Signed)
PT is NPO for procedure today, pts BP 145/66. Per MD Vianne Bulls ok to give morning dose of Cardizem.

## 2015-05-19 NOTE — Anesthesia Procedure Notes (Signed)
Spinal  Start time: 05/19/2015 1:07 PM End time: 05/19/2015 1:19 PM Staffing Anesthesiologist: Gunnar Fusi Resident/CRNA: Jonna Clark Performed by: resident/CRNA  Preanesthetic Checklist Completed: patient identified, site marked, surgical consent, pre-op evaluation, timeout performed, IV checked, risks and benefits discussed and monitors and equipment checked Spinal Block Patient position: left lateral decubitus Prep: Betadine Patient monitoring: heart rate, continuous pulse ox and blood pressure Approach: midline Location: L3-4 Injection technique: single-shot Needle Needle type: [place]  Needle gauge: 25 G Needle length: 9 cm Assessment Sensory level: T8 Events: paresthesia

## 2015-05-19 NOTE — Progress Notes (Signed)
[facility] [place] NAME: Holly Clark    MR#:  [id number]  DATE OF BIRTH:  14-Oct-1929  SUBJECTIVE: Admitted for right hip fracture. Scheduled to have surgery today. History of fall 4 days ago.   CHIEF COMPLAINT:   Chief Complaint  Patient presents with  . Hip Injury  . Fall    REVIEW OF SYSTEMS:   ROS CONSTITUTIONAL: No fever, fatigue or weakness.  EYES: No blurred or double vision.  EARS, NOSE, AND THROAT: No tinnitus or ear pain.  RESPIRATORY: No cough, shortness of breath, wheezing or hemoptysis.  CARDIOVASCULAR: No chest pain, orthopnea, edema.  GASTROINTESTINAL: No nausea, vomiting, diarrhea or abdominal pain.  GENITOURINARY: No dysuria, hematuria.  ENDOCRINE: No polyuria, nocturia,  HEMATOLOGY: No anemia, easy bruising or bleeding SKIN: No rash or lesion. MUSCULOSKELETAL: Denies any pain at this time.  NEUROLOGIC: No tingling, numbness, weakness.  PSYCHIATRY: No anxiety or depression.   DRUG ALLERGIES:  No Known Allergies  VITALS:  Blood pressure 145/66, pulse 86, temperature 99.5 F (37.5 C), temperature source Oral, resp. rate 16, height 5' (1.524 m), weight 47.219 kg (104 lb 1.6 oz), SpO2 97 %.  PHYSICAL EXAMINATION:  GENERAL:  80 y.o.-year-old patient lying in the bed with no acute distress.  EYES: Pupils equal, round, reactive to light and accommodation. No scleral icterus. Extraocular muscles intact.  HEENT: Head atraumatic, normocephalic. Oropharynx and nasopharynx clear.  NECK:  Supple, no jugular venous distention. No thyroid enlargement, no tenderness.  LUNGS: Normal breath sounds bilaterally, no wheezing, rales,rhonchi or crepitation. No use of accessory muscles of respiration.  CARDIOVASCULAR: S1, S2 normal. No murmurs, rubs, or gallops.  ABDOMEN: Soft, nontender, nondistended. Bowel sounds present. No organomegaly or mass.  EXTREMITIES: No pedal edema, cyanosis, or clubbing.  NEUROLOGIC: Cranial  nerves II through XII are intact. Muscle strength wnl except the right leg. Right leg not checkedm due to fracture. Sensation intact. Gait not checked.  PSYCHIATRIC: The patient is alert and oriented x 3.  SKIN: No obvious rash, lesion, or ulcer.    LABORATORY PANEL:   CBC  Recent Labs Lab 05/19/15 0535  WBC 5.4  HGB 12.0  HCT 36.0  PLT 168   ------------------------------------------------------------------------------------------------------------------  Chemistries   Recent Labs Lab 05/18/15 1626 05/19/15 0535  NA 131* 134*  K 4.7 4.3  CL 96* 104  CO2 24 24  GLUCOSE 122* 106*  BUN 30* 24*  CREATININE 1.50* 1.21*  CALCIUM 9.2 8.8*  AST 28  --   ALT 21  --   ALKPHOS 98  --   BILITOT 0.8  --    ------------------------------------------------------------------------------------------------------------------  Cardiac Enzymes No results for input(s): TROPONINI in the last 168 hours. ------------------------------------------------------------------------------------------------------------------  RADIOLOGY:  Dg Chest 1 View  05/18/2015  CLINICAL DATA:  Right hip pain since a fall four days ago. Right hip fracture. EXAM: CHEST 1 VIEW COMPARISON:  09/30/2013 FINDINGS: Heart size and pulmonary vascularity are normal and the lungs are clear. Prior CABG. Aortic atherosclerosis. Moderate hiatal hernia. No acute abnormalities. IMPRESSION: No acute abnormalities. Moderate hiatal hernia. Aortic atherosclerosis. Electronically Signed   By: Lorriane Shire M.D.   On: 05/18/2015 16:01   Ct Head Wo Contrast  05/18/2015  CLINICAL DATA:  Status post fall.  Facial bruising. EXAM: CT HEAD WITHOUT CONTRAST CT MAXILLOFACIAL WITHOUT CONTRAST CT CERVICAL SPINE WITHOUT CONTRAST TECHNIQUE: Multidetector CT imaging of the head, cervical spine, and maxillofacial structures were performed using the standard protocol without intravenous contrast. Multiplanar  CT image reconstructions of the  cervical spine and maxillofacial structures were also generated. COMPARISON:  None. FINDINGS: CT HEAD FINDINGS There is low attenuation within the subcortical and periventricular white matter compatible with chronic microvascular disease. Prominence of the sulci and ventricles identified consistent with brain atrophy. There is no evidence for acute cortical infarct, intracranial hemorrhage or mass. The paranasal sinuses and the mastoid air cells appear clear. The calvarium is intact. CT MAXILLOFACIAL FINDINGS The paranasal sinuses are clear. The nasal bone is intact. The zygomatic arches are intact. Mandible is located and intact. No evidence for orbital fracture. CT CERVICAL SPINE FINDINGS There is straightening of normal cervical lordosis. The vertebral body heights are well preserved. The prevertebral soft tissue space appears normal. Multi level disc space narrowing and ventral endplate spurring is noted this is most advanced at C4-5 and C5-6. The facet joints appear all well aligned. No fractures or subluxations. Calcified left cervical lymph nodes identified. IMPRESSION: 1. No acute intracranial abnormalities. 2. Small vessel ischemic disease and brain atrophy. 3. No evidence for facial bone fracture. 4. No evidence for cervical spine fracture. 5. Cervical spondylosis. Electronically Signed   By: Kerby Moors M.D.   On: 05/18/2015 16:46   Ct Cervical Spine Wo Contrast  05/18/2015  CLINICAL DATA:  Status post fall.  Facial bruising. EXAM: CT HEAD WITHOUT CONTRAST CT MAXILLOFACIAL WITHOUT CONTRAST CT CERVICAL SPINE WITHOUT CONTRAST TECHNIQUE: Multidetector CT imaging of the head, cervical spine, and maxillofacial structures were performed using the standard protocol without intravenous contrast. Multiplanar CT image reconstructions of the cervical spine and maxillofacial structures were also generated. COMPARISON:  None. FINDINGS: CT HEAD FINDINGS There is low attenuation within the subcortical and  periventricular white matter compatible with chronic microvascular disease. Prominence of the sulci and ventricles identified consistent with brain atrophy. There is no evidence for acute cortical infarct, intracranial hemorrhage or mass. The paranasal sinuses and the mastoid air cells appear clear. The calvarium is intact. CT MAXILLOFACIAL FINDINGS The paranasal sinuses are clear. The nasal bone is intact. The zygomatic arches are intact. Mandible is located and intact. No evidence for orbital fracture. CT CERVICAL SPINE FINDINGS There is straightening of normal cervical lordosis. The vertebral body heights are well preserved. The prevertebral soft tissue space appears normal. Multi level disc space narrowing and ventral endplate spurring is noted this is most advanced at C4-5 and C5-6. The facet joints appear all well aligned. No fractures or subluxations. Calcified left cervical lymph nodes identified. IMPRESSION: 1. No acute intracranial abnormalities. 2. Small vessel ischemic disease and brain atrophy. 3. No evidence for facial bone fracture. 4. No evidence for cervical spine fracture. 5. Cervical spondylosis. Electronically Signed   By: Kerby Moors M.D.   On: 05/18/2015 16:46   Dg Knee Complete 4 Views Right  05/18/2015  CLINICAL DATA:  Pt presents to ER with Fall that happened Thursday due to tripping over a cord and landed on concrete. Pt presents with facial bruising , right hip and knee pain that she is unable to bear weigh on since then, and wounds on lower extremities. EXAM: RIGHT KNEE - COMPLETE 4+ VIEW COMPARISON:  None. FINDINGS: No fracture or dislocation. There are prominent calcifications along the joint cartilage and menisci. The knee joint is normally spaced and aligned. Bones are diffusely demineralized. No joint effusion. There are vascular calcifications posteriorly as well as a vascular stent along the femoral artery. IMPRESSION: 1. No fracture or dislocation.  No acute finding.  Electronically Signed  By: Lajean Manes M.D.   On: 05/18/2015 16:54   Dg Hip Unilat  With Pelvis 2-3 Views Right  05/18/2015  CLINICAL DATA:  Unable to bear weight. Right hip pain. Status post fall. EXAM: DG HIP (WITH OR WITHOUT PELVIS) 2-3V RIGHT COMPARISON:  None. FINDINGS: Impacted subcapital right hip fracture.  No dislocation. Generalized osteopenia.  No left hip fracture or dislocation. Peripheral vascular atherosclerotic disease. IMPRESSION: Impacted subcapital right hip fracture. Electronically Signed   By: Kathreen Devoid   On: 05/18/2015 15:59   Ct Maxillofacial Wo Cm  05/18/2015  CLINICAL DATA:  Status post fall.  Facial bruising. EXAM: CT HEAD WITHOUT CONTRAST CT MAXILLOFACIAL WITHOUT CONTRAST CT CERVICAL SPINE WITHOUT CONTRAST TECHNIQUE: Multidetector CT imaging of the head, cervical spine, and maxillofacial structures were performed using the standard protocol without intravenous contrast. Multiplanar CT image reconstructions of the cervical spine and maxillofacial structures were also generated. COMPARISON:  None. FINDINGS: CT HEAD FINDINGS There is low attenuation within the subcortical and periventricular white matter compatible with chronic microvascular disease. Prominence of the sulci and ventricles identified consistent with brain atrophy. There is no evidence for acute cortical infarct, intracranial hemorrhage or mass. The paranasal sinuses and the mastoid air cells appear clear. The calvarium is intact. CT MAXILLOFACIAL FINDINGS The paranasal sinuses are clear. The nasal bone is intact. The zygomatic arches are intact. Mandible is located and intact. No evidence for orbital fracture. CT CERVICAL SPINE FINDINGS There is straightening of normal cervical lordosis. The vertebral body heights are well preserved. The prevertebral soft tissue space appears normal. Multi level disc space narrowing and ventral endplate spurring is noted this is most advanced at C4-5 and C5-6. The facet joints  appear all well aligned. No fractures or subluxations. Calcified left cervical lymph nodes identified. IMPRESSION: 1. No acute intracranial abnormalities. 2. Small vessel ischemic disease and brain atrophy. 3. No evidence for facial bone fracture. 4. No evidence for cervical spine fracture. 5. Cervical spondylosis. Electronically Signed   By: Kerby Moors M.D.   On: 05/18/2015 16:46    EKG:   Orders placed or performed during the hospital encounter of 05/18/15  . EKG 12-Lead  . EKG 12-Lead    ASSESSMENT AND PLAN:  1. Right hip fracture: Initial encounter. Patient is at moderate risk for surgery secondary to her advanced age, COPD hypertension. But no contraindications for surgery. Continue pain medicine, DVT prophylaxis. Start physical therapy as soon as possible. Continue incentive spirometry after the procedure #2 dehydration with acute renal failure'due to ATN: Started on IV hydration. Renal function improved. #3 hyponatremia improved. #4 hypothyroidism continue Synthroid. #5 hyperlipidemia continue simvastatin. 6. Depression continue Wellbutrin. #7 COPD without wheezing at this time; continue home medications. #8 hypertension controlled; continue Avapro, Cardizem CD.    All the records are reviewed and case discussed with Care Management/Social Workerr. Management plans discussed with the patient, family and they are in agreement.  CODE STATUS: full  TOTAL TIME TAKING CARE OF THIS PATIENT:35 minutes.   POSSIBLE D/C IN 3-4 DAYS, DEPENDING ON CLINICAL CONDITION.   Epifanio Lesches M.D on 05/19/2015 at 10:01 AM  Between 7am to 6pm - Pager - [phone]  After 6pm go to [email or web address] - password [email or web address] [facility]  [facility]  Office  [phone]  CC: Primary care physician; No primary care provider on file.   Note: This dictation was prepared with Dragon dictation along with smaller phrase technology. Any transcriptional errors that result from this process  are unintentional.

## 2015-05-19 NOTE — H&P (Signed)
Pt. Became wheezy and O2 droppped to 91% on RA. Dr. Jannifer Franklin notified and he ordered stat chest x-ray and lisix. IVF stopped.

## 2015-05-19 NOTE — Transfer of Care (Signed)
Immediate Anesthesia Transfer of Care Note  Patient: Holly Clark  Procedure(s) Performed: Procedure(s): CANNULATED HIP PINNING (Right)  Patient Location: PACU  Anesthesia Type:Spinal  Level of Consciousness: patient cooperative and lethargic  Airway & Oxygen Therapy: Patient Spontanous Breathing and Patient connected to nasal cannula oxygen  Post-op Assessment: Report given to RN and Post -op Vital signs reviewed and stable  Post vital signs: Reviewed and stable  Last Vitals:  Filed Vitals:   05/19/15 1233 05/19/15 1410  BP: 112/61 126/57  Pulse: 85   Temp:  37.2 C  Resp: 16     Complications: No apparent anesthesia complications

## 2015-05-19 NOTE — Anesthesia Preprocedure Evaluation (Signed)
Anesthesia Evaluation  Patient identified by MRN, date of birth, ID band Patient awake    Reviewed: Allergy & Precautions, NPO status , Patient's Chart, lab work & pertinent test results  History of Anesthesia Complications Negative for: history of anesthetic complications  Airway Mallampati: III       Dental  (+) Partial Lower, Edentulous Upper, Chipped   Pulmonary neg pulmonary ROS, COPD, Current Smoker,           Cardiovascular hypertension, Pt. on medications + angina with exertion + CAD and + CABG       Neuro/Psych Depression negative neurological ROS     GI/Hepatic negative GI ROS, Neg liver ROS,   Endo/Other  Hypothyroidism   Renal/GU negative Renal ROS     Musculoskeletal   Abdominal   Peds  Hematology negative hematology ROS (+)   Anesthesia Other Findings   Reproductive/Obstetrics                             Anesthesia Physical Anesthesia Plan  ASA: III  Anesthesia Plan: Spinal   Post-op Pain Management:    Induction:   Airway Management Planned: Nasal Cannula  Additional Equipment:   Intra-op Plan:   Post-operative Plan:   Informed Consent: I have reviewed the patients History and Physical, chart, labs and discussed the procedure including the risks, benefits and alternatives for the proposed anesthesia with the patient or authorized representative who has indicated his/her understanding and acceptance.     Plan Discussed with:   Anesthesia Plan Comments:         Anesthesia Quick Evaluation

## 2015-05-19 NOTE — Progress Notes (Signed)
Pts. 10 out of 10 pain after receiving 2 mg of morphine. Dr. Roland Rack ordered 2-4 mg of morphine q1h prn

## 2015-05-19 NOTE — Transfer of Care (Signed)
Immediate Anesthesia Transfer of Care Note  Patient: Holly Clark  Procedure(s) Performed: Procedure(s): CANNULATED HIP PINNING (Right)  Patient Location: PACU  Anesthesia Type:Spinal  Level of Consciousness: awake, alert  and oriented  Airway & Oxygen Therapy: Patient Spontanous Breathing and Patient connected to nasal cannula oxygen  Post-op Assessment: Report given to RN and Post -op Vital signs reviewed and stable  Post vital signs: Reviewed and stable  Last Vitals:  Filed Vitals:   05/19/15 1233 05/19/15 1410  BP: 112/61 126/57  Pulse: 85   Temp:  37.2 C  Resp: 16     Complications: No apparent anesthesia complications

## 2015-05-20 DIAGNOSIS — S72001A Fracture of unspecified part of neck of right femur, initial encounter for closed fracture: Secondary | ICD-10-CM | POA: Diagnosis not present

## 2015-05-20 LAB — CBC
HEMATOCRIT: 36.2 % (ref 35.0–47.0)
Hemoglobin: 12.4 g/dL (ref 12.0–16.0)
MCH: 31.3 pg (ref 26.0–34.0)
MCHC: 34.2 g/dL (ref 32.0–36.0)
MCV: 91.5 fL (ref 80.0–100.0)
PLATELETS: 181 10*3/uL (ref 150–440)
RBC: 3.95 MIL/uL (ref 3.80–5.20)
RDW: 13.3 % (ref 11.5–14.5)
WBC: 7.4 10*3/uL (ref 3.6–11.0)

## 2015-05-20 LAB — BASIC METABOLIC PANEL
ANION GAP: 10 (ref 5–15)
BUN: 21 mg/dL — ABNORMAL HIGH (ref 6–20)
CALCIUM: 8.7 mg/dL — AB (ref 8.9–10.3)
CO2: 25 mmol/L (ref 22–32)
Chloride: 100 mmol/L — ABNORMAL LOW (ref 101–111)
Creatinine, Ser: 1.22 mg/dL — ABNORMAL HIGH (ref 0.44–1.00)
GFR, EST AFRICAN AMERICAN: 46 mL/min — AB (ref >60)
GFR, EST NON AFRICAN AMERICAN: 40 mL/min — AB (ref >60)
Glucose, Bld: 120 mg/dL — ABNORMAL HIGH (ref 65–99)
Potassium: 4.2 mmol/L (ref 3.5–5.1)
SODIUM: 135 mmol/L (ref 135–145)

## 2015-05-20 MED ORDER — SODIUM CHLORIDE 0.9 % IV SOLN
INTRAVENOUS | Status: DC
  Administered 2015-05-20 – 2015-05-21 (×2): via INTRAVENOUS

## 2015-05-20 NOTE — Clinical Social Work Placement (Signed)
   CLINICAL SOCIAL WORK PLACEMENT  NOTE  Date:  05/20/2015  Patient Details  Name: Holly Clark MRN: [id number] Date of Birth: 1929-08-19  Clinical Social Work is seeking post-discharge placement for this patient at the [place] level of care (*CSW will initial, date and re-position this form in  chart as items are completed):  Yes   Patient/family provided with [facility] Work Department's list of facilities offering this level of care within the geographic area requested by the patient (or if unable, by the patient's family).  Yes   Patient/family informed of their freedom to choose among providers that offer the needed level of care, that participate in Medicare, Medicaid or managed care program needed by the patient, have an available bed and are willing to accept the patient.  Yes   Patient/family informed of [facility]'s ownership interest in [facility] and [facility], as well as of the fact that they are under no obligation to receive care at these facilities.  PASRR submitted to EDS on 05/19/15     PASRR number received on 05/19/15     Existing PASRR number confirmed on       FL2 transmitted to all facilities in geographic area requested by pt/family on 05/20/15     FL2 transmitted to all facilities within larger geographic area on       Patient informed that his/her managed care company has contracts with or will negotiate with certain facilities, including the following:        Yes   Patient/family informed of bed offers received.  Patient chooses bed at  [facility] )     Physician recommends and patient chooses bed at      Patient to be transferred to   on  .  Patient to be transferred to facility by       Patient family notified on   of transfer.  Name of family member notified:        PHYSICIAN       Additional Comment:    _______________________________________________ Loralyn Freshwater,  LCSW 05/20/2015, 12:14 PM

## 2015-05-20 NOTE — Care Management Important Message (Signed)
Important Message  Patient Details  Name: Holly Clark MRN: [id number] Date of Birth: 1930/03/31   Medicare Important Message Given:  Yes    [name] 05/20/2015, 1:58 PM

## 2015-05-20 NOTE — Progress Notes (Signed)
Initial Nutrition Assessment   INTERVENTION:   Meals and Snacks: Cater to patient preferences Medical Food Supplement Therapy: will send Magic cup on lunch and dinner trays for added nutrition Coordination of Care: will recommend new weight for clarification   NUTRITION DIAGNOSIS:   Increased nutrient needs related to wound healing as evidenced by estimated needs.  GOAL:   Patient will meet greater than or equal to 90% of their needs  MONITOR:    (Energy Intake, Electrolyte and Renal Profile, Anthropometrics, Digestive System)  REASON FOR ASSESSMENT:   Consult Hip fracture protocol  ASSESSMENT:   Pt admitted s/p fall at home with right hip fracture s/p pinning on 12/13. Pt working with OT this am.  Past Medical History  Diagnosis Date  . COPD (chronic obstructive pulmonary disease) ([place])   . Depression   . Hypothyroidism   . Coronary artery disease   . Dementia     Diet Order:  Diet Heart Room service appropriate?: Yes; Fluid consistency:: Thin    Current Nutrition: Pt ate bites this am but reports there was a lot of interruptions this am. Pt asking for hot tea.  Food/Nutrition-Related History: Pt reports good appetite PTA eating 3 meals per day without supplements.   Scheduled Medications:  . atorvastatin  10 mg Oral QHS  . buPROPion  75 mg Oral Daily  . diltiazem  120 mg Oral Daily  . docusate sodium  100 mg Oral BID  . donepezil  5 mg Oral QHS  . enoxaparin (LOVENOX) injection  30 mg Subcutaneous Q24H  . irbesartan  150 mg Oral Daily  . levothyroxine  100 mcg Oral QAC breakfast  . mometasone-formoterol  2 puff Inhalation BID  . tiotropium  18 mcg Inhalation Daily     Electrolyte/Renal Profile and Glucose Profile:   Recent Labs Lab 05/18/15 1626 05/19/15 0535 05/20/15 0613  NA 131* 134* 135  K 4.7 4.3 4.2  CL 96* 104 100*  CO2 '24 24 25  '$ BUN 30* 24* 21*  CREATININE 1.50* 1.21* 1.22*  CALCIUM 9.2 8.8* 8.7*  GLUCOSE 122* 106* 120*   Protein  Profile:   Recent Labs Lab 05/18/15 1626  ALBUMIN 4.1    Gastrointestinal Profile: Last BM:  05/17/2015   Nutrition-Focused Physical Exam Findings:  Unable to complete Nutrition-Focused physical exam at this time.    Weight Change: Pt reports usual body weight around 95lbs, currrent weight recorded 100lbs, pt reports stable weight PTA   Height:   Ht Readings from Last 1 Encounters:  05/19/15 5' (1.524 m)    Weight:   Wt Readings from Last 1 Encounters:  05/19/15 100 lb (45.36 kg)    BMI:  Body mass index is 19.53 kg/(m^2).  Estimated Nutritional Needs:   Kcal:  BEE: 831kcals, TEE: (IF 1.1-1.3)(AF 1.2) [id number]  Protein:  45-54g protein (1.0-1.2g/kg)  Fluid:  1125-138m of fluid (25-38mkg)  EDUCATION NEEDS:   No education needs identified at this time   MOElsberryRD, LDN Pager [phone]

## 2015-05-20 NOTE — Progress Notes (Signed)
Physical Therapy Treatment Patient Details Name: Holly Clark MRN: [id number] DOB: September 06, 1929 Today's Date: 05/20/2015    History of Present Illness Pt admitted for R hip fracture after falling over her dog. She is now s/p cannulated hip pinning. Pt with history of COPD, depression, and dementia.     PT Comments    Pt is making improved progress towards goals with ability to use rw this session and ambulate back to bed. Still requires + 2 assist for mobility and limited by pain. Good endurance with there-ex. Pt agreeable to take pain meds at this time. Pt lethargic this PM.  Follow Up Recommendations  SNF     Equipment Recommendations       Recommendations for Other Services       Precautions / Restrictions Precautions Precautions: Fall Restrictions Weight Bearing Restrictions: Yes RLE Weight Bearing: Weight bearing as tolerated Other Position/Activity Restrictions: WBAT    Mobility  Bed Mobility Overal bed mobility: Needs Assistance Bed Mobility: Sit to Supine     Supine to sit: Max assist;+2 for physical assistance     General bed mobility comments: bed mobility performed with max assist. +2 assist required  Transfers Overall transfer level: Needs assistance Equipment used: Rolling walker (2 wheeled) Transfers: Sit to/from Stand Sit to Stand: Max assist;+2 physical assistance         General transfer comment: Pt able to perform improved standing with rw at this time. Pt with forward flexed posture with limited WB on R LE.  Ambulation/Gait Ambulation/Gait assistance: Max assist;+2 physical assistance Ambulation Distance (Feet): 3 Feet Assistive device: Rolling walker (2 wheeled) Gait Pattern/deviations: Step-to pattern     General Gait Details: Pt able to take small steps towards the bed with heavy cues for sequencing. Pt very anxious with all mobility and is limited in further mobility secondary to pain.   Stairs            Wheelchair  Mobility    Modified Rankin (Stroke Patients Only)       Balance Overall balance assessment: History of Falls;Needs assistance Sitting-balance support: Bilateral upper extremity supported Sitting balance-Leahy Scale: Fair     Standing balance support: Bilateral upper extremity supported Standing balance-Leahy Scale: Poor                      Cognition Arousal/Alertness: Awake/alert Behavior During Therapy: WFL for tasks assessed/performed Overall Cognitive Status: Within Functional Limits for tasks assessed                      Exercises Other Exercises Other Exercises: supine ther-ex performed on B LE including ankle pumps, quad sets, SAQ, hip abd/add, and SLRs. All ther-ex performed x 10 reps with min assist for correct technique.    General Comments        Pertinent Vitals/Pain Pain Assessment: 0-10 Pain Score: 4  Pain Location: R hip Pain Descriptors / Indicators: Constant;Aching Pain Intervention(s): Patient requesting pain meds-RN notified;RN gave pain meds during session;Limited activity within patient's tolerance    Home Living Family/patient expects to be discharged to:: Private residence Living Arrangements: Children Available Help at Discharge: Family Type of Home: House Home Access: Level entry   Home Layout: Able to live on main level with bedroom/bathroom Home Equipment: None      Prior Function Level of Independence: Independent          PT Goals (current goals can now be found in the care plan section) Acute Rehab  PT Goals Patient Stated Goal: to get stronger PT Goal Formulation: With patient Time For Goal Achievement: 06/03/15 Potential to Achieve Goals: Fair Additional Goals Additional Goal #1: Pt will be able to perform bed mobility/transfers with min assist in order to improve functional independence Progress towards PT goals: Progressing toward goals    Frequency  BID    PT Plan Current plan remains appropriate     Co-evaluation             End of Session Equipment Utilized During Treatment: Gait belt;Oxygen Activity Tolerance: Patient limited by pain Patient left: in bed;with bed alarm set     Time: 1115-1145 PT Time Calculation (min) (ACUTE ONLY): 30 min  Charges:  $Gait Training: 8-22 mins $Therapeutic Exercise: 8-22 mins                    G Codes:      Holly Clark June 16, 2015, 2:03 PM  Greggory Stallion, PT, DPT [phone]

## 2015-05-20 NOTE — Progress Notes (Signed)
Pts foley d/c'd at 0600 with 150cc output

## 2015-05-20 NOTE — Progress Notes (Signed)
Pts foley catheter was removed at 0600 am this am, pt has not yet voided. Pt bladder scanned, 191 ml.  MD Vianne Bulls notified. Per order in and out X1, and start normal saline at 50 ml/hr. Will continue to monitor.

## 2015-05-20 NOTE — Progress Notes (Signed)
Clinical Social Worker (CSW) received call back from patient's daughter [name]. CSW made daughter aware that patient has a bed at [facility] however patient wants to come home. Per daughter patient will need to go to rehab and her and her sister Leslie will talk with patient about going to rehab. CSW met with patient and made her aware that daughters want her to go to rehab. Patient reported that she would think about it. CSW will continue to follow and assist as needed.    Morgan, LCSWA [phone]  

## 2015-05-20 NOTE — Progress Notes (Signed)
Pt has been refusing pain meds all morning.  Nurse explained importance of maintaining pain control to pt and family who is at the bedside. Will continue to monitor.

## 2015-05-20 NOTE — Progress Notes (Signed)
   Subjective: 1 Day Post-Op Procedure(s) (LRB): CANNULATED HIP PINNING (Right) Patient reports pain as moderate.   Patient is well, and has had no acute complaints or problems We will start therapy today.  Plan is to go Rehab after hospital stay.  Objective: Vital signs in last 24 hours: Temp:  [97.6 F (36.4 C)-99 F (37.2 C)] 98.2 F (36.8 C) (12/14 0643) Pulse Rate:  [80-93] 88 (12/14 0643) Resp:  [16-20] 17 (12/14 0643) BP: (91-167)/(54-70) 91/70 mmHg (12/14 0643) SpO2:  [91 %-100 %] 98 % (12/14 0643) FiO2 (%):  [21 %] 21 % (12/13 1534) Weight:  [45.36 kg (100 lb)] 45.36 kg (100 lb) (12/13 1233)  Intake/Output from previous day: 12/13 0701 - 12/14 0700 In: 2255 [P.O.:480; I.V.:1150; IV Piggyback:150] Out: 1725 [Urine:1725] Intake/Output this shift:     Recent Labs  05/18/15 1626 05/19/15 0535 05/20/15 0613  HGB 12.8 12.0 12.4    Recent Labs  05/19/15 0535 05/20/15 0613  WBC 5.4 7.4  RBC 3.95 3.95  HCT 36.0 36.2  PLT 168 181    Recent Labs  05/19/15 0535 05/20/15 0613  NA 134* 135  K 4.3 4.2  CL 104 100*  CO2 24 25  BUN 24* 21*  CREATININE 1.21* 1.22*  GLUCOSE 106* 120*  CALCIUM 8.8* 8.7*    Recent Labs  05/18/15 1626  INR 1.00    EXAM General - Patient is Alert, Appropriate and Oriented Extremity - Neurovascular intact Sensation intact distally Intact pulses distally Dorsiflexion/Plantar flexion intact Dressing - dressing C/D/I and no drainage Motor Function - intact, moving foot and toes well on exam.   Past Medical History  Diagnosis Date  . COPD (chronic obstructive pulmonary disease) ([place])   . Depression   . Hypothyroidism   . Coronary artery disease   . Dementia     Assessment/Plan:   1 Day Post-Op Procedure(s) (LRB): CANNULATED HIP PINNING (Right) Active Problems:   Hip fracture, right (HCC)  Estimated body mass index is 19.53 kg/(m^2) as calculated from the following:   Height as of this encounter: 5' (1.524  m).   Weight as of this encounter: 45.36 kg (100 lb). Advance diet Up with therapy , WBAT RLE Needs BM    DVT Prophylaxis - Lovenox, Foot Pumps and TED hose Weight-Bearing as tolerated to right leg D/C O2 and Pulse OX and try on Room Air  T. Rachelle Hora, PA-C [place] 05/20/2015, 8:05 AM

## 2015-05-20 NOTE — Anesthesia Postprocedure Evaluation (Signed)
Anesthesia Post Note  Patient: Holly Clark  Procedure(s) Performed: Procedure(s) (LRB): CANNULATED HIP PINNING (Right)  Patient location during evaluation: Nursing Unit Anesthesia Type: Spinal Level of consciousness: awake and patient cooperative Pain management: pain level controlled Vital Signs Assessment: post-procedure vital signs reviewed and stable Respiratory status: spontaneous breathing Cardiovascular status: blood pressure returned to baseline Postop Assessment: no headache and no backache Anesthetic complications: no    Last Vitals:  Filed Vitals:   05/20/15 0333 05/20/15 0643  BP: 157/63 91/70  Pulse: 80 88  Temp: 36.8 C 36.8 C  Resp: 18 17    Last Pain:  Filed Vitals:   05/20/15 0736  PainSc: [place], Holly Clark

## 2015-05-20 NOTE — Evaluation (Signed)
Occupational Therapy Evaluation Patient Details Name: Holly Clark MRN: [id number] DOB: Dec 20, 1929 Today's Date: 05/20/2015    History of Present Illness This patient is an 81 year old female who came to [facility] after a fall suffering a right hip fracture. She recieved an ORIF repair.   Clinical Impression    This patient is an 81 year old female who came to [facility] after a fall suffering a left hip fracture. She received an open reduction with internal fixation repair. She hand been independent with ADL and functional mobility.  She now requires assist and would benefit from Occupational Therapy for ADL/functioal mobility training.       Follow Up Recommendations  SNF    Equipment Recommendations       Recommendations for Other Services       Precautions / Restrictions Restrictions Other Position/Activity Restrictions: WBAT      Mobility Bed Mobility                  Transfers                      Balance                                            ADL                                         General ADL Comments: Patient had been independent.  Today practiced lower body dressing using hip kit. Practiced techniques to Donned/doffed socks and pants to knees with hand over hand assist to illustrate.      Vision     Perception     Praxis      Pertinent Vitals/Pain       Hand Dominance     Extremity/Trunk Assessment Upper Extremity Assessment Upper Extremity Assessment: Overall WFL for tasks assessed   Lower Extremity Assessment Lower Extremity Assessment: Defer to PT evaluation       Communication     Cognition Arousal/Alertness: Awake/alert Behavior During Therapy: WFL for tasks assessed/performed Overall Cognitive Status: Within Functional Limits for tasks assessed                     General Comments       Exercises       Shoulder Instructions       Home Living Family/patient expects to be discharged to:: Private residence Living Arrangements: Children (son) Available Help at Discharge: Family Type of Home: House                                  Prior Functioning/Environment Level of Independence: Independent             OT Diagnosis: Acute pain   OT Problem List: Decreased range of motion;Decreased activity tolerance;Impaired balance (sitting and/or standing);Decreased knowledge of use of DME or AE;Pain   OT Treatment/Interventions: Self-care/ADL training    OT Goals(Current goals can be found in the care plan section)    OT Frequency: Min 1X/week   Barriers to D/C:            Co-evaluation  End of Session Equipment Utilized During Treatment:  (hip kit)  Activity Tolerance:   Patient left: in chair;with chair alarm set;with family/visitor present   Time: 1015-1036 OT Time Calculation (min): 21 min Charges:  OT General Charges $OT Visit: 1 Procedure OT Evaluation $Initial OT Evaluation Tier I: 1 Procedure OT Treatments $Self Care/Home Management : 8-22 mins G-Codes:    Myrene Galas, MS/OTR/L  05/20/2015, 10:41 AM

## 2015-05-20 NOTE — Progress Notes (Addendum)
[place] [facility] [place] NAME: Holly Clark    MR#:  [id number]  DATE OF BIRTH:  1929/10/28  SUBJECTIVE: Admitted for right hip fracture. Seen at bedside.s/p surgey.  CHIEF COMPLAINT:   Chief Complaint  Patient presents with  . Hip Injury  . Fall    REVIEW OF SYSTEMS:   ROS CONSTITUTIONAL: No fever, fatigue or weakness.  EYES: No blurred or double vision.  EARS, NOSE, AND THROAT: No tinnitus or ear pain.  RESPIRATORY: No cough, shortness of breath, wheezing or hemoptysis.  CARDIOVASCULAR: No chest pain, orthopnea, edema.  GASTROINTESTINAL: No nausea, vomiting, diarrhea or abdominal pain.  GENITOURINARY: No dysuria, hematuria.  ENDOCRINE: No polyuria, nocturia,  HEMATOLOGY: No anemia, easy bruising or bleeding SKIN: No rash or lesion. MUSCULOSKELETAL: Denies any pain at this time.  NEUROLOGIC: No tingling, numbness, weakness.  PSYCHIATRY: No anxiety or depression.   DRUG ALLERGIES:  No Known Allergies  VITALS:  Blood pressure 101/83, pulse 89, temperature 98.2 F (36.8 C), temperature source Oral, resp. rate 17, height 5' (1.524 m), weight 45.36 kg (100 lb), SpO2 98 %.  PHYSICAL EXAMINATION:  GENERAL:  81 y.o.-year-old patient lying in the bed with no acute distress.  EYES: Pupils equal, round, reactive to light and accommodation. No scleral icterus. Extraocular muscles intact.  HEENT: Head atraumatic, normocephalic. Oropharynx and nasopharynx clear.  NECK:  Supple, no jugular venous distention. No thyroid enlargement, no tenderness.  LUNGS: Normal breath sounds bilaterally, no wheezing, rales,rhonchi or crepitation. No use of accessory muscles of respiration.  CARDIOVASCULAR: S1, S2 normal. No murmurs, rubs, or gallops.  ABDOMEN: Soft, nontender, nondistended. Bowel sounds present. No organomegaly or mass.  EXTREMITIES: No pedal edema, cyanosis, or clubbing.  NEUROLOGIC: Cranial nerves II through XII are intact. Muscle  strength wnl except the right leg. Right leg not checkedm due to fracture. Sensation intact. Gait not checked.  PSYCHIATRIC: The patient is alert and oriented x 3.  SKIN: No obvious rash, lesion, or ulcer.    LABORATORY PANEL:   CBC  Recent Labs Lab 05/20/15 0613  WBC 7.4  HGB 12.4  HCT 36.2  PLT 181   ------------------------------------------------------------------------------------------------------------------  Chemistries   Recent Labs Lab 05/18/15 1626  05/20/15 0613  NA 131*  < > 135  K 4.7  < > 4.2  CL 96*  < > 100*  CO2 24  < > 25  GLUCOSE 122*  < > 120*  BUN 30*  < > 21*  CREATININE 1.50*  < > 1.22*  CALCIUM 9.2  < > 8.7*  AST 28  --   --   ALT 21  --   --   ALKPHOS 98  --   --   BILITOT 0.8  --   --   < > = values in this interval not displayed. ------------------------------------------------------------------------------------------------------------------  Cardiac Enzymes No results for input(s): TROPONINI in the last 168 hours. ------------------------------------------------------------------------------------------------------------------  RADIOLOGY:  Dg Chest 1 View  05/19/2015  CLINICAL DATA:  Wheezing and hypoxia EXAM: CHEST  1 VIEW COMPARISON:  05/18/2015 FINDINGS: Cardiac shadow is stable. Postoperative changes are noted. A hiatal hernia is again seen. Aortic atherosclerotic changes again noted. No significant infiltrate or edema is seen. IMPRESSION: No active disease. Electronically Signed   By: Inez Catalina M.D.   On: 05/19/2015 21:20   Dg Chest 1 View  05/18/2015  CLINICAL DATA:  Right hip pain since a fall four days ago. Right hip fracture. EXAM: CHEST 1 VIEW  COMPARISON:  09/30/2013 FINDINGS: Heart size and pulmonary vascularity are normal and the lungs are clear. Prior CABG. Aortic atherosclerosis. Moderate hiatal hernia. No acute abnormalities. IMPRESSION: No acute abnormalities. Moderate hiatal hernia. Aortic atherosclerosis.  Electronically Signed   By: Lorriane Shire M.D.   On: 05/18/2015 16:01   Ct Head Wo Contrast  05/18/2015  CLINICAL DATA:  Status post fall.  Facial bruising. EXAM: CT HEAD WITHOUT CONTRAST CT MAXILLOFACIAL WITHOUT CONTRAST CT CERVICAL SPINE WITHOUT CONTRAST TECHNIQUE: Multidetector CT imaging of the head, cervical spine, and maxillofacial structures were performed using the standard protocol without intravenous contrast. Multiplanar CT image reconstructions of the cervical spine and maxillofacial structures were also generated. COMPARISON:  None. FINDINGS: CT HEAD FINDINGS There is low attenuation within the subcortical and periventricular white matter compatible with chronic microvascular disease. Prominence of the sulci and ventricles identified consistent with brain atrophy. There is no evidence for acute cortical infarct, intracranial hemorrhage or mass. The paranasal sinuses and the mastoid air cells appear clear. The calvarium is intact. CT MAXILLOFACIAL FINDINGS The paranasal sinuses are clear. The nasal bone is intact. The zygomatic arches are intact. Mandible is located and intact. No evidence for orbital fracture. CT CERVICAL SPINE FINDINGS There is straightening of normal cervical lordosis. The vertebral body heights are well preserved. The prevertebral soft tissue space appears normal. Multi level disc space narrowing and ventral endplate spurring is noted this is most advanced at C4-5 and C5-6. The facet joints appear all well aligned. No fractures or subluxations. Calcified left cervical lymph nodes identified. IMPRESSION: 1. No acute intracranial abnormalities. 2. Small vessel ischemic disease and brain atrophy. 3. No evidence for facial bone fracture. 4. No evidence for cervical spine fracture. 5. Cervical spondylosis. Electronically Signed   By: Kerby Moors M.D.   On: 05/18/2015 16:46   Ct Cervical Spine Wo Contrast  05/18/2015  CLINICAL DATA:  Status post fall.  Facial bruising. EXAM: CT  HEAD WITHOUT CONTRAST CT MAXILLOFACIAL WITHOUT CONTRAST CT CERVICAL SPINE WITHOUT CONTRAST TECHNIQUE: Multidetector CT imaging of the head, cervical spine, and maxillofacial structures were performed using the standard protocol without intravenous contrast. Multiplanar CT image reconstructions of the cervical spine and maxillofacial structures were also generated. COMPARISON:  None. FINDINGS: CT HEAD FINDINGS There is low attenuation within the subcortical and periventricular white matter compatible with chronic microvascular disease. Prominence of the sulci and ventricles identified consistent with brain atrophy. There is no evidence for acute cortical infarct, intracranial hemorrhage or mass. The paranasal sinuses and the mastoid air cells appear clear. The calvarium is intact. CT MAXILLOFACIAL FINDINGS The paranasal sinuses are clear. The nasal bone is intact. The zygomatic arches are intact. Mandible is located and intact. No evidence for orbital fracture. CT CERVICAL SPINE FINDINGS There is straightening of normal cervical lordosis. The vertebral body heights are well preserved. The prevertebral soft tissue space appears normal. Multi level disc space narrowing and ventral endplate spurring is noted this is most advanced at C4-5 and C5-6. The facet joints appear all well aligned. No fractures or subluxations. Calcified left cervical lymph nodes identified. IMPRESSION: 1. No acute intracranial abnormalities. 2. Small vessel ischemic disease and brain atrophy. 3. No evidence for facial bone fracture. 4. No evidence for cervical spine fracture. 5. Cervical spondylosis. Electronically Signed   By: Kerby Moors M.D.   On: 05/18/2015 16:46   Dg Knee Complete 4 Views Right  05/18/2015  CLINICAL DATA:  Pt presents to ER with Fall that happened Thursday due to tripping  over a cord and landed on concrete. Pt presents with facial bruising , right hip and knee pain that she is unable to bear weigh on since then, and  wounds on lower extremities. EXAM: RIGHT KNEE - COMPLETE 4+ VIEW COMPARISON:  None. FINDINGS: No fracture or dislocation. There are prominent calcifications along the joint cartilage and menisci. The knee joint is normally spaced and aligned. Bones are diffusely demineralized. No joint effusion. There are vascular calcifications posteriorly as well as a vascular stent along the femoral artery. IMPRESSION: 1. No fracture or dislocation.  No acute finding. Electronically Signed   By: Lajean Manes M.D.   On: 05/18/2015 16:54   Dg Hip Operative Unilat With Pelvis Right  05/19/2015  CLINICAL DATA:  Fixation of right hip fracture.  Initial encounter. EXAM: OPERATIVE RIGHT HIP (WITH PELVIS IF PERFORMED) 1 VIEW TECHNIQUE: Fluoroscopic spot image(s) were submitted for interpretation post-operatively. COMPARISON:  Plain films of the right hip 05/18/2015. FINDINGS: Single intraoperative fluoroscopic spot views demonstrates 3 fixation screws for fixation of a subcapital right hip fracture. The screws appear well-positioned. No acute abnormality is identified. IMPRESSION: ORIF right subcapital hip fracture without evidence of complication. Electronically Signed   By: Inge Rise M.D.   On: 05/19/2015 14:27   Dg Hip Unilat  With Pelvis 2-3 Views Right  05/18/2015  CLINICAL DATA:  Unable to bear weight. Right hip pain. Status post fall. EXAM: DG HIP (WITH OR WITHOUT PELVIS) 2-3V RIGHT COMPARISON:  None. FINDINGS: Impacted subcapital right hip fracture.  No dislocation. Generalized osteopenia.  No left hip fracture or dislocation. Peripheral vascular atherosclerotic disease. IMPRESSION: Impacted subcapital right hip fracture. Electronically Signed   By: Kathreen Devoid   On: 05/18/2015 15:59   Ct Maxillofacial Wo Cm  05/18/2015  CLINICAL DATA:  Status post fall.  Facial bruising. EXAM: CT HEAD WITHOUT CONTRAST CT MAXILLOFACIAL WITHOUT CONTRAST CT CERVICAL SPINE WITHOUT CONTRAST TECHNIQUE: Multidetector CT imaging of  the head, cervical spine, and maxillofacial structures were performed using the standard protocol without intravenous contrast. Multiplanar CT image reconstructions of the cervical spine and maxillofacial structures were also generated. COMPARISON:  None. FINDINGS: CT HEAD FINDINGS There is low attenuation within the subcortical and periventricular white matter compatible with chronic microvascular disease. Prominence of the sulci and ventricles identified consistent with brain atrophy. There is no evidence for acute cortical infarct, intracranial hemorrhage or mass. The paranasal sinuses and the mastoid air cells appear clear. The calvarium is intact. CT MAXILLOFACIAL FINDINGS The paranasal sinuses are clear. The nasal bone is intact. The zygomatic arches are intact. Mandible is located and intact. No evidence for orbital fracture. CT CERVICAL SPINE FINDINGS There is straightening of normal cervical lordosis. The vertebral body heights are well preserved. The prevertebral soft tissue space appears normal. Multi level disc space narrowing and ventral endplate spurring is noted this is most advanced at C4-5 and C5-6. The facet joints appear all well aligned. No fractures or subluxations. Calcified left cervical lymph nodes identified. IMPRESSION: 1. No acute intracranial abnormalities. 2. Small vessel ischemic disease and brain atrophy. 3. No evidence for facial bone fracture. 4. No evidence for cervical spine fracture. 5. Cervical spondylosis. Electronically Signed   By: Kerby Moors M.D.   On: 05/18/2015 16:46    EKG:   Orders placed or performed during the hospital encounter of 05/18/15  . EKG 12-Lead  . EKG 12-Lead    ASSESSMENT AND PLAN:  1. Right hip fracture: Initial encounter. Patient is at moderate risk for  surgery secondary to her advanced age, COPD hypertension. But no contraindications for surgery.  S/p surgery  #2 dehydration with acute renal failure'due to ATN: improved with  hydration.d/c iv fluids ;,had wheezing last night.chest xray last night ,no pneumonia or chf.continue duoneb 3 hyponatremia improved. #4 hypothyroidism continue Synthroid. #5 hyperlipidemia continue simvastatin. 6. Depression continue Wellbutrin. #7 COPD without wheezing at this time; continue home medications. #8 hypertension ; hypotensive this morning hold the Cardizem, Avapro. \9.right hip fracture;s/p surgery;continue PT/lovenox./rehab  D/w family.    All the records are reviewed and case discussed with Care Management/Social Workerr. Management plans discussed with the patient, family and they are in agreement.  CODE STATUS: full  TOTAL TIME TAKING CARE OF THIS PATIENT:35 minutes.   POSSIBLE D/C IN 3-4 DAYS, DEPENDING ON CLINICAL CONDITION.   Epifanio Lesches M.D on 05/20/2015 at 11:44 AM  Between 7am to 6pm - Pager - [phone]  After 6pm go to [email or web address] - password [email or web address] [facility]  [place]  Office  [phone]  CC: Primary care physician; No primary care provider on file.   Note: This dictation was prepared with Dragon dictation along with smaller phrase technology. Any transcriptional errors that result from this process are unintentional.

## 2015-05-20 NOTE — Evaluation (Signed)
Physical Therapy Evaluation Patient Details Name: Holly Clark MRN: [id number] DOB: 08/17/1929 Today's Date: 05/20/2015   History of Present Illness  Pt admitted for R hip fracture after falling over her dog. She is now s/p cannulated hip pinning. Pt with history of COPD, depression, and dementia.   Clinical Impression  Pt is a pleasant 81 year old female who was admitted for R hip fracture and is now s/p hip ORIF. Pt performs bed mobility with mod assist and transfers with max assist + 2. Unable to use RW at this time. Pt demonstrates deficits with strength/balance/mobility. Would benefit from skilled PT to address above deficits and promote optimal return to PLOF. Pt currently limited by pain, however does not want any pain meds at this time.      Follow Up Recommendations SNF    Equipment Recommendations       Recommendations for Other Services       Precautions / Restrictions Precautions Precautions: Fall Restrictions Weight Bearing Restrictions: Yes RLE Weight Bearing: Weight bearing as tolerated Other Position/Activity Restrictions: WBAT      Mobility  Bed Mobility Overal bed mobility: Needs Assistance Bed Mobility: Supine to Sit     Supine to sit: Mod assist     General bed mobility comments: bed mobility performed with mod assist. Assist required for assist for scooting towards EOB.  Transfers Overall transfer level: Needs assistance Equipment used: Rolling walker (2 wheeled) Transfers: Sit to/from Stand Sit to Stand: Max assist;+2 physical assistance         General transfer comment: 3 attempts for standing with rw and pt unable to weight bear secondary to pain despite education on WB status. Pt unable to fully stand upright. Pt then able perform stand pivot transfer to recliner with max assist + 2.  Ambulation/Gait             General Gait Details: unable at this time  Stairs            Wheelchair Mobility    Modified Rankin (Stroke  Patients Only)       Balance Overall balance assessment: History of Falls;Needs assistance Sitting-balance support: Bilateral upper extremity supported Sitting balance-Leahy Scale: Fair     Standing balance support: Bilateral upper extremity supported Standing balance-Leahy Scale: Poor                               Pertinent Vitals/Pain Pain Assessment: 0-10 Pain Score: 4  Pain Location:  R hip Pain Descriptors / Indicators: Aching Pain Intervention(s): Limited activity within patient's tolerance;Monitored during session    Home Living Family/patient expects to be discharged to:: Private residence Living Arrangements: Children Available Help at Discharge: Family Type of Home: House Home Access: Level entry     Home Layout: Able to live on main level with bedroom/bathroom Home Equipment: None      Prior Function Level of Independence: Independent               Hand Dominance        Extremity/Trunk Assessment   Upper Extremity Assessment: Overall WFL for tasks assessed           Lower Extremity Assessment: RLE deficits/detail RLE Deficits / Details: R LE grossly 2+/5       Communication   Communication: No difficulties  Cognition Arousal/Alertness: Awake/alert Behavior During Therapy: WFL for tasks assessed/performed Overall Cognitive Status: Within Functional Limits for tasks assessed  General Comments      Exercises Other Exercises Other Exercises: Pt performed supine ther-ex including R LE ankle pumps, quad sets, SLRs, hip abd/add, and SAQ. Pt able to perform 10 reps of ther-ex and needs cues for correct technique. Min assist required.      Assessment/Plan    PT Assessment Patient needs continued PT services  PT Diagnosis Difficulty walking;Generalized weakness;Acute pain   PT Problem List Decreased strength;Decreased balance;Decreased knowledge of use of DME;Pain  PT Treatment Interventions  Gait training;Therapeutic activities;Therapeutic exercise   PT Goals (Current goals can be found in the Care Plan section) Acute Rehab PT Goals Patient Stated Goal: to get stronger PT Goal Formulation: With patient Time For Goal Achievement: 06/03/15 Potential to Achieve Goals: Fair    Frequency BID   Barriers to discharge        Co-evaluation               End of Session Equipment Utilized During Treatment: Gait belt;Oxygen Activity Tolerance: Patient limited by pain Patient left: in chair;with chair alarm set Nurse Communication: Mobility status         Time: 1219-7588 PT Time Calculation (min) (ACUTE ONLY): 1405 min   Charges:   PT Evaluation $Initial PT Evaluation Tier I: 1 Procedure PT Treatments $Therapeutic Exercise: 8-22 mins   PT G Codes:        Sherree Shankman 06/01/15, 1:52 PM  Greggory Stallion, PT, DPT [phone]

## 2015-05-20 NOTE — Clinical Social Work Note (Signed)
Clinical Social Work Assessment  Patient Details  Name: Holly Clark MRN: [id number] Date of Birth: [birth year]  Date of referral:  05/20/15               Reason for consult:  Facility Placement                Permission sought to share information with:  Chartered certified accountant granted to share information::  Yes, Verbal Permission Granted  Name::      [place]::   [place]   Relationship::     Contact Information:     Housing/Transportation Living arrangements for the past 2 months:  [place] of Information:  Patient Patient Interpreter Needed:  None Criminal Activity/Legal Involvement Pertinent to Current Situation/Hospitalization:  No - Comment as needed Significant Relationships:  Adult Children Lives with:  Adult Children Do you feel safe going back to the place where you live?  Yes Need for family participation in patient care:  Yes (Comment)  Care giving concerns: Patient lives in [place] with her son Holly Clark.    Social Worker assessment / plan: Patient had surgery yesterday (05/19/15) with Dr. Rudene Christians. PT is recommending SNF. Clinical Social Worker (CSW) met with patient to discuss D/C plan. Patient was alert and oriented and sitting up in the chair. CSW introduced self and explained role of CSW department. Patient reported that she lives in [place] with her son Holly Clark. Patient also reported that she has 2 daughters. Patient gave CSW permission to contact her family. CSW explained that PT is recommending short term rehab. Patient asked if she can go home. CSW explained the difference between home health and SNF. Patient reported that if she goes to rehab she would like [facility]. Patient reported that she would like to think about going to SNF but did give CSW permission to send bed search.   CSW presented offers. Patient chose [facility]. Patient reported that she would still like to  consider going home. CSW left 2 voicemails for patient's daughter's Holly Clark and Holly Clark. CSW will continue to follow and assist as needed.   Employment status:  Retired Forensic scientist:  Medicare PT Recommendations:  [place] / Referral to community resources:  [place]  Patient/Family's Response to care: Patient is considering Materials engineer or going home with home health.   Patient/Family's Understanding of and Emotional Response to Diagnosis, Current Treatment, and Prognosis: Patient was pleasant throughout assessment.   Emotional Assessment Appearance:  Appears stated age Attitude/Demeanor/Rapport:    Affect (typically observed):  Accepting, Adaptable, Pleasant Orientation:  Oriented to Self, Oriented to Place, Oriented to  Time, Oriented to Situation Alcohol / Substance use:  Not Applicable Psych involvement (Current and /or in the community):  No (Comment)  Discharge Needs  Concerns to be addressed:  Discharge Planning Concerns Readmission within the last 30 days:  No Current discharge risk:  Dependent with Mobility Barriers to Discharge:  Continued Medical Work up   Loralyn Freshwater, LCSW 05/20/2015, 12:15 PM

## 2015-05-21 ENCOUNTER — Encounter
Admission: RE | Admit: 2015-05-21 | Discharge: 2015-05-21 | Disposition: A | Discharge status: Home / Self Care | Payer: Medicare Other | Source: Ambulatory Visit | Attending: Internal Medicine | Admitting: Internal Medicine

## 2015-05-21 ENCOUNTER — Encounter: Payer: Self-pay | Admitting: Orthopedic Surgery

## 2015-05-21 DIAGNOSIS — D649 Anemia, unspecified: Secondary | ICD-10-CM | POA: Insufficient documentation

## 2015-05-21 DIAGNOSIS — R042 Hemoptysis: Secondary | ICD-10-CM | POA: Insufficient documentation

## 2015-05-21 MED ORDER — IPRATROPIUM-ALBUTEROL 0.5-2.5 (3) MG/3ML IN SOLN
3.0000 mL | Freq: Four times a day (QID) | RESPIRATORY_TRACT | Status: DC
  Administered 2015-05-21 – 2015-05-22 (×6): 3 mL via RESPIRATORY_TRACT
  Filled 2015-05-21 (×6): qty 3

## 2015-05-21 MED ORDER — AZITHROMYCIN 250 MG PO TABS
500.0000 mg | ORAL_TABLET | Freq: Every day | ORAL | Status: DC
  Administered 2015-05-21 – 2015-05-22 (×2): 500 mg via ORAL
  Filled 2015-05-21 (×2): qty 2

## 2015-05-21 MED ORDER — DEXTROSE 5 % IV SOLN
1.0000 g | INTRAVENOUS | Status: DC
  Administered 2015-05-21 – 2015-05-22 (×2): 1 g via INTRAVENOUS
  Filled 2015-05-21 (×3): qty 10

## 2015-05-21 NOTE — Progress Notes (Addendum)
Per chart patient will likely be ready for D/C tomorrow. Clinical Social Worker (CSW) met with patient to discuss D/C plan. Patient was sitting in the chair and her son was at bedside. Patient reported that she still prefers to go home. CSW provided emotional support and reported that PT, MD, RN and her daughters want her to go to [facility]. Patient reported that she would think about it. Son also encourged patient to go to rehab. CSW contacted patient's daughter [name] and made her aware of above. Per [name] she is coming to hospital today to discuss D/C plan with patient. [name] asked if she can "make patient go to [facility] since she is HPOA." CSW explained that patient is alert and oriented and can make her own decisions. CSW explained that the hospital cannot force patient to go to rehab if she is alert and oriented. CSW also left patient's other daughter [name] a Advertising account executive. CSW will continue to follow and assist as need.   CSW received call back from patient's daughter [name]. CSW made [facility] aware of above. Per daughter she will send her husband to speak with patient about going to rehab.   [name], [place] [phone]

## 2015-05-21 NOTE — Progress Notes (Signed)
[place] [facility] [place] NAME: Holly Clark    MR#:  [id number]  DATE OF BIRTH:  1929/09/28  SUBJECTIVE: Has hypoxia with room air sats 85%. Patient denies shortness of breath, cough. Does have some wheezing. On 2 L is 95%.   CHIEF COMPLAINT:   Chief Complaint  Patient presents with  . Hip Injury  . Fall    REVIEW OF SYSTEMS:   ROS CONSTITUTIONAL: No fever, fatigue or weakness.  EYES: No blurred or double vision.  EARS, NOSE, AND THROAT: No tinnitus or ear pain.  RESPIRATORY: No cough, shortness of breath, wheezing or hemoptysis.  CARDIOVASCULAR: No chest pain, orthopnea, edema.  GASTROINTESTINAL: No nausea, vomiting, diarrhea or abdominal pain.  GENITOURINARY: No dysuria, hematuria.  ENDOCRINE: No polyuria, nocturia,  HEMATOLOGY: No anemia, easy bruising or bleeding SKIN: No rash or lesion. MUSCULOSKELETAL: Denies any pain at this time.  NEUROLOGIC: No tingling, numbness, weakness.  PSYCHIATRY: No anxiety or depression.   DRUG ALLERGIES:  No Known Allergies  VITALS:  Blood pressure 119/68, pulse 112, temperature 98.9 F (37.2 C), temperature source Oral, resp. rate 18, height 5' (1.524 m), weight 51.03 kg (112 lb 8 oz), SpO2 96 %.  PHYSICAL EXAMINATION:  GENERAL:  81 y.o.-year-old patient lying in the bed with no acute distress.  EYES: Pupils equal, round, reactive to light and accommodation. No scleral icterus. Extraocular muscles intact.  HEENT: Head atraumatic, normocephalic. Oropharynx and nasopharynx clear.  NECK:  Supple, no jugular venous distention. No thyroid enlargement, no tenderness.  LUNGS:FAINT  Bilateral wheezing in lungs. rales,rhonchi or crepitation. No use of accessory muscles of respiration.  CARDIOVASCULAR: S1, S2 normal. No murmurs, rubs, or gallops.  ABDOMEN: Soft, nontender, nondistended. Bowel sounds present. No organomegaly or mass.  EXTREMITIES: No pedal edema, cyanosis, or clubbing.  NEUROLOGIC:  Cranial nerves II through XII are intact. Muscle strength wnl except the right leg. Right leg not checkedm due to fracture. Sensation intact. Gait not checked.  PSYCHIATRIC: The patient is alert and oriented x 3.  SKIN: No obvious rash, lesion, or ulcer.    LABORATORY PANEL:   CBC  Recent Labs Lab 05/20/15 0613  WBC 7.4  HGB 12.4  HCT 36.2  PLT 181   ------------------------------------------------------------------------------------------------------------------  Chemistries   Recent Labs Lab 05/18/15 1626  05/20/15 0613  NA 131*  < > 135  K 4.7  < > 4.2  CL 96*  < > 100*  CO2 24  < > 25  GLUCOSE 122*  < > 120*  BUN 30*  < > 21*  CREATININE 1.50*  < > 1.22*  CALCIUM 9.2  < > 8.7*  AST 28  --   --   ALT 21  --   --   ALKPHOS 98  --   --   BILITOT 0.8  --   --   < > = values in this interval not displayed. ------------------------------------------------------------------------------------------------------------------  Cardiac Enzymes No results for input(s): TROPONINI in the last 168 hours. ------------------------------------------------------------------------------------------------------------------  RADIOLOGY:  Dg Chest 1 View  05/19/2015  CLINICAL DATA:  Wheezing and hypoxia EXAM: CHEST  1 VIEW COMPARISON:  05/18/2015 FINDINGS: Cardiac shadow is stable. Postoperative changes are noted. A hiatal hernia is again seen. Aortic atherosclerotic changes again noted. No significant infiltrate or edema is seen. IMPRESSION: No active disease. Electronically Signed   By: Inez Catalina M.D.   On: 05/19/2015 21:20   Dg Hip Operative Unilat With Pelvis Right  05/19/2015  CLINICAL  DATA:  Fixation of right hip fracture.  Initial encounter. EXAM: OPERATIVE RIGHT HIP (WITH PELVIS IF PERFORMED) 1 VIEW TECHNIQUE: Fluoroscopic spot image(s) were submitted for interpretation post-operatively. COMPARISON:  Plain films of the right hip 05/18/2015. FINDINGS: Single intraoperative  fluoroscopic spot views demonstrates 3 fixation screws for fixation of a subcapital right hip fracture. The screws appear well-positioned. No acute abnormality is identified. IMPRESSION: ORIF right subcapital hip fracture without evidence of complication. Electronically Signed   By: Inge Rise M.D.   On: 05/19/2015 14:27    EKG:   Orders placed or performed during the hospital encounter of 05/18/15  . EKG 12-Lead  . EKG 12-Lead    ASSESSMENT AND PLAN:  1. Right hip fracture: Initial encounter. Status post surgery. Continue physical therapy, Lovenox. Likely discharge to [facility] rehabilitation tomorrow. #2 dehydration with acute renal failure'due to ATN: improved with hydration.d/c iv fluids ;,had wheezing last night.chest xray last night ,no pneumonia or chf.continue duoneb, having some cough and green phlegm times nurses are concerned about possible infection even the x-ray is negative. Sodium going to start the antibiotics, continue oxygen and continue nebulizers. 3 hyponatremia improved. #4 hypothyroidism continue Synthroid. #5 hyperlipidemia continue simvastatin. 6. Depression continue Wellbutrin. #7 COPD without wheezing at this time; continue home medications. #8 hypertension ; and tachycardia: Continue home medications, continue pain control. \9.right hip fracture;s/p surgery;continue PT/lovenox./rehab  D/w SON    All the records are reviewed and case discussed with Care Management/Social Workerr. Management plans discussed with the patient, family and they are in agreement.  CODE STATUS: full  TOTAL TIME TAKING CARE OF THIS PATIENT:35 minutes.   POSSIBLE D/C IN 3-4 DAYS, DEPENDING ON CLINICAL CONDITION.   Epifanio Lesches M.D on 05/21/2015 at 11:09 AM  Between 7am to 6pm - Pager - [phone]  After 6pm go to [email or web address] - password [email or web address] [facility]  [place] [facility]  Office  [phone]  CC: Primary care physician; No primary care provider on  file.   Note: This dictation was prepared with Dragon dictation along with smaller phrase technology. Any transcriptional errors that result from this process are unintentional.

## 2015-05-21 NOTE — Progress Notes (Signed)
Physical Therapy Treatment Patient Details Name: Holly Clark MRN: [id number] DOB: 08-Aug-1929 Today's Date: 05/21/2015    History of Present Illness Pt admitted for R hip fracture after falling over her dog. She is now s/p cannulated hip pinning. Pt with history of COPD, depression, and dementia.     PT Comments    Pt able to progress to ambulation 6 feet x2 with RW and 2 assist this afternoon.  Pt still demonstrates posterior lean and antalgic gait pattern but pt's functional mobility is improving with repetition.  Will continue to progress ambulation distance per pt tolerance.   Follow Up Recommendations  SNF     Equipment Recommendations       Recommendations for Other Services       Precautions / Restrictions Precautions Precautions: Fall Restrictions Weight Bearing Restrictions: Yes RLE Weight Bearing: Weight bearing as tolerated    Mobility  Bed Mobility Overal bed mobility: Needs Assistance Bed Mobility: Supine to Sit;Sit to Supine     Supine to sit: Mod assist;HOB elevated Sit to supine: Min assist;+2 for physical assistance   General bed mobility comments: assist for trunk and R LE  Transfers Overall transfer level: Needs assistance Equipment used: Rolling walker (2 wheeled) Transfers: Sit to/from Stand Sit to Stand: Min assist;Mod assist;+2 physical assistance         General transfer comment: pt requiring vc's for hand and feet placement for standing; vc's requiring for transfer technique, stepping pattern, and use of RW  Ambulation/Gait Ambulation/Gait assistance: Min assist;Mod assist;+2 physical assistance Ambulation Distance (Feet):  (6 feet x2) Assistive device: Rolling walker (2 wheeled)   Gait velocity: decreased   General Gait Details: antalgic; mod vc's for upright posture (pt with posterior lean), assist and vc's for navigating RW, mod vc's for stepping pattern required   Stairs            Wheelchair Mobility    Modified  Rankin (Stroke Patients Only)       Balance                                    Cognition Arousal/Alertness: Awake/alert Behavior During Therapy: WFL for tasks assessed/performed Overall Cognitive Status: Within Functional Limits for tasks assessed       Memory: Decreased short-term memory              Exercises      General Comments   Nursing cleared pt for participation in physical therapy.  Pt agreeable to PT session.      Pertinent Vitals/Pain Pain Assessment: 0-10 Pain Score: 4  Pain Location: R hip Pain Descriptors / Indicators: Aching;Constant Pain Intervention(s): Limited activity within patient's tolerance;Monitored during session;Repositioned  Vitals stable and WFL throughout treatment session on 2 L/min O2 via nasal cannula.    Home Living                      Prior Function            PT Goals (current goals can now be found in the care plan section) Acute Rehab PT Goals Patient Stated Goal: to get stronger PT Goal Formulation: With patient Time For Goal Achievement: 06/03/15 Potential to Achieve Goals: Good Additional Goals Additional Goal #1: Pt will be able to perform bed mobility/transfers with min assist in order to improve functional independence Progress towards PT goals: Progressing toward goals    Frequency  BID    PT Plan Current plan remains appropriate    Co-evaluation             End of Session Equipment Utilized During Treatment: Gait belt;Oxygen Activity Tolerance: Patient limited by pain;Patient limited by fatigue Patient left: in bed;with call bell/phone within reach;with family/visitor present;with nursing/sitter in room;with SCD's reapplied (B heels elevated; bed alarm would not turn on (nursing notified and came to assess))     Time: 1555-1620 PT Time Calculation (min) (ACUTE ONLY): 25 min  Charges:  $Gait Training: 8-22 mins $Therapeutic Activity: 8-22 mins                    G CodesLeitha Bleak May 25, 2015, 4:38 PM Leitha Bleak, [place]

## 2015-05-21 NOTE — Progress Notes (Signed)
   Subjective: 2 Days Post-Op Procedure(s) (LRB): CANNULATED HIP PINNING (Right) Patient reports pain as mild.   Patient is well, and has had no acute complaints or problems We will continue therapy today.  Plan is to go Rehab after hospital stay.  Objective: Vital signs in last 24 hours: Temp:  [97.8 F (36.6 C)-99 F (37.2 C)] 98.9 F (37.2 C) (12/15 0748) Pulse Rate:  [84-97] 97 (12/15 0748) Resp:  [17-19] 18 (12/15 0748) BP: (101-163)/(57-83) 119/68 mmHg (12/15 0748) SpO2:  [91 %-97 %] 91 % (12/15 0748) Weight:  [51.03 kg (112 lb 8 oz)] 51.03 kg (112 lb 8 oz) (12/14 1403)  Intake/Output from previous day: 12/14 0701 - 12/15 0700 In: 608.3 [I.V.:608.3] Out: 450 [Urine:450] Intake/Output this shift:     Recent Labs  05/18/15 1626 05/19/15 0535 05/20/15 0613  HGB 12.8 12.0 12.4    Recent Labs  05/19/15 0535 05/20/15 0613  WBC 5.4 7.4  RBC 3.95 3.95  HCT 36.0 36.2  PLT 168 181    Recent Labs  05/19/15 0535 05/20/15 0613  NA 134* 135  K 4.3 4.2  CL 104 100*  CO2 24 25  BUN 24* 21*  CREATININE 1.21* 1.22*  GLUCOSE 106* 120*  CALCIUM 8.8* 8.7*    Recent Labs  05/18/15 1626  INR 1.00    EXAM General - Patient is Alert, Appropriate and Oriented Extremity - Neurovascular intact Sensation intact distally Intact pulses distally Dorsiflexion/Plantar flexion intact Dressing - dressing C/D/I and no drainage Motor Function - intact, moving foot and toes well on exam.   Past Medical History  Diagnosis Date  . COPD (chronic obstructive pulmonary disease) ([place])   . Depression   . Hypothyroidism   . Coronary artery disease   . Dementia     Assessment/Plan:   2 Days Post-Op Procedure(s) (LRB): CANNULATED HIP PINNING (Right) Active Problems:   Hip fracture, right ([facility])  Estimated body mass index is 21.97 kg/(m^2) as calculated from the following:   Height as of this encounter: 5' (1.524 m).   Weight as of this encounter: 51.03 kg (112 lb 8  oz). Advance diet Up with therapy , WBAT RLE Needs BM Plan on discharge to [facility] Friday.    DVT Prophylaxis - Lovenox, Foot Pumps and TED hose Weight-Bearing as tolerated to right leg D/C O2 and Pulse OX and try on Room Air  T. Rachelle Hora, PA-C [place] 05/21/2015, 8:16 AM

## 2015-05-21 NOTE — Progress Notes (Signed)
Pt with sob. o2 2liters added. Lungs with scattered rhonci. Productive cough thick green sputum. Dr Vianne Bulls notified. Rocephin 1 gm iv qd added along with duonebs q6hr. Pt oob to chair to void

## 2015-05-21 NOTE — Progress Notes (Signed)
Physical Therapy Treatment Patient Details Name: Holly Clark MRN: [id number] DOB: 04-18-1930 Today's Date: 05/21/2015    History of Present Illness Pt admitted for R hip fracture after falling over her dog. She is now s/p cannulated hip pinning. Pt with history of COPD, depression, and dementia.     PT Comments    Pt's O2 90% on room air upon entering room and noted to be 85% on room air with activity so pt placed on 2 L/min O2 via nasal cannula by nursing and O2 increased to 94%.  Pt requires vc's for gait technique (stepping pattern) and use of walker (pt requires assist to move walker as well).  Will attempt to progress ambulation distance per pt tolerance.   Follow Up Recommendations  SNF     Equipment Recommendations       Recommendations for Other Services       Precautions / Restrictions Precautions Precautions: Fall Restrictions Weight Bearing Restrictions: Yes RLE Weight Bearing: Weight bearing as tolerated    Mobility  Bed Mobility Overal bed mobility: Needs Assistance Bed Mobility: Supine to Sit     Supine to sit: Max assist;HOB elevated     General bed mobility comments: assist for trunk and R LE  Transfers Overall transfer level: Needs assistance Equipment used: Rolling walker (2 wheeled) Transfers: Sit to/from [facility] Sit to Stand: Mod assist;+2 physical assistance (from bed and from commode) Stand pivot transfers: Mod assist;+2 physical assistance;Min assist       General transfer comment: pt requiring vc's for hand and feet placement for standing; vc's requiring for transfer technique, stepping pattern, and use of RW  Ambulation/Gait Ambulation/Gait assistance: Mod assist;Min assist;+2 physical assistance Ambulation Distance (Feet): 3 Feet (bed to commode; commode to recliner) Assistive device: Rolling walker (2 wheeled) Gait Pattern/deviations: Step-to pattern Gait velocity: decreased   General Gait Details:  antalgic; max vc's for upright posture (pt with posterior lean), assist and vc's for navigating RW, max vc's for stepping pattern required   Stairs            Wheelchair Mobility    Modified Rankin (Stroke Patients Only)       Balance Overall balance assessment: Needs assistance Sitting-balance support: Bilateral upper extremity supported;Feet supported Sitting balance-Leahy Scale: Fair     Standing balance support: Bilateral upper extremity supported (on RW) Standing balance-Leahy Scale: Poor                      Cognition Arousal/Alertness: Awake/alert Behavior During Therapy: WFL for tasks assessed/performed Overall Cognitive Status:  (Oriented to person, place, time and knows she has a fx but forgot multiple times during session she had a surgery)       Memory: Decreased short-term memory              Exercises   Performed semi-supine B LE therapeutic exercise x 10 reps:  Ankle pumps (AROM B LE's); quad sets x3 second holds (AROM B LE's); glute squeezes x3 second holds (AROM B); SAQ's (AAROM R; AROM L); heelslides (AAROM R; AROM L), hip abd/adduction (AAROM R; AROM L).  Pt required vc's and tactile cues for correct technique with exercises.     General Comments   Nursing cleared pt for participation in physical therapy and present assisting during session.  Pt agreeable to PT session.       Pertinent Vitals/Pain Pain Assessment: 0-10 Pain Score: 4  Pain Location: R hip Pain Descriptors / Indicators: Constant;Aching Pain Intervention(s):  Limited activity within patient's tolerance;Monitored during session;Premedicated before session;Repositioned      Home Living                      Prior Function            PT Goals (current goals can now be found in the care plan section) Acute Rehab PT Goals Patient Stated Goal: to get stronger PT Goal Formulation: With patient Time For Goal Achievement: 06/03/15 Potential to Achieve Goals:  Fair Additional Goals Additional Goal #1: Pt will be able to perform bed mobility/transfers with min assist in order to improve functional independence Progress towards PT goals: Progressing toward goals    Frequency  BID    PT Plan Current plan remains appropriate    Co-evaluation             End of Session Equipment Utilized During Treatment: Gait belt;Oxygen Activity Tolerance: Patient limited by pain Patient left: in chair;with call bell/phone within reach;with chair alarm set;with SCD's reapplied (B heels elevated via towel rolls)     Time: 6269-4854 PT Time Calculation (min) (ACUTE ONLY): 28 min  Charges:  $Therapeutic Exercise: 8-22 mins $Therapeutic Activity: 8-22 mins                    G CodesLeitha Bleak May 27, 2015, 10:57 AM Leitha Bleak, [place]

## 2015-05-22 MED ORDER — MAGNESIUM HYDROXIDE 400 MG/5ML PO SUSP: 30.0000 mL | Freq: Every day | ORAL | Status: DC | PRN

## 2015-05-22 MED ORDER — ENOXAPARIN SODIUM 30 MG/0.3ML ~~LOC~~ SOLN: 30.0000 mg | SUBCUTANEOUS | Status: DC

## 2015-05-22 MED ORDER — ALUM & MAG HYDROXIDE-SIMETH 200-200-20 MG/5ML PO SUSP: 30.0000 mL | ORAL | Status: DC | PRN

## 2015-05-22 MED ORDER — HYDROCODONE-ACETAMINOPHEN 5-325 MG PO TABS: 1.0000 | ORAL_TABLET | Freq: Four times a day (QID) | ORAL | Status: DC | PRN

## 2015-05-22 MED ORDER — BISACODYL 10 MG RE SUPP: 10.0000 mg | Freq: Every day | RECTAL | Status: DC | PRN

## 2015-05-22 MED ORDER — AZITHROMYCIN 500 MG PO TABS: 500.0000 mg | ORAL_TABLET | Freq: Every day | ORAL | Status: DC

## 2015-05-22 MED ORDER — AMOXICILLIN-POT CLAVULANATE 875-125 MG PO TABS: 1.0000 | ORAL_TABLET | Freq: Two times a day (BID) | ORAL | Status: DC

## 2015-05-22 MED ORDER — DOCUSATE SODIUM 100 MG PO CAPS: 100.0000 mg | ORAL_CAPSULE | Freq: Two times a day (BID) | ORAL | Status: AC

## 2015-05-22 MED ORDER — ALBUTEROL SULFATE HFA 108 (90 BASE) MCG/ACT IN AERS: 2.0000 | INHALATION_SPRAY | Freq: Four times a day (QID) | RESPIRATORY_TRACT | Status: DC | PRN

## 2015-05-22 NOTE — Progress Notes (Signed)
PT Cancellation Note  Patient Details Name: Holly Clark MRN: [id number] DOB: 1929-12-12   Cancelled Treatment:    Reason Eval/Treat Not Completed: Patient declined, no reason specified (Nursing reports pt with recent emesis; pt requesting to hold therapy until later.)  Will re-attempt PT treatment later this morning.   Raquel Sarna Jlee Harkless 05/22/2015, 9:55 AM Leitha Bleak, [place]

## 2015-05-22 NOTE — Discharge Instructions (Signed)

## 2015-05-22 NOTE — Progress Notes (Signed)
Patient being discharged today to [facility]. Belongings packed by family & room rechecked by nurse. IV's removed & EMS called.

## 2015-05-22 NOTE — Progress Notes (Signed)
Patient is medically stable for D/C to [facility] today. Per Kim admissions coordinator at [facility] patient is going to room 211. RN will call report at [phone] and arrange EMS for transport. Clinical Education officer, museum (CSW) sent D/C Summary, D/C packet and FL2 to [employer] via [employer]. Patient is aware of above and in agreement with plan. Patient's daughter [name] is at bedside and aware of above. Please reconsult if future social work needs arise. CSW signing off.   [name], [place] [phone]

## 2015-05-22 NOTE — Care Management Important Message (Signed)
Important Message  Patient Details  Name: Holly Clark MRN: [id number] Date of Birth: 1930-02-24   Medicare Important Message Given:  Yes    Bianka Liberati A, RN 05/22/2015, 8:33 AM

## 2015-05-22 NOTE — NC FL2 (Signed)
[place] LEVEL OF CARE SCREENING TOOL     IDENTIFICATION  Patient Name: Holly Clark Birthdate: 12-23-1929 Sex: female Admission Date (Current Location): 05/18/2015  [place] and [place] Number:  [facility] )   Facility and Address:  [facility], [address], [place],  [address]      Provider Number: [id number]  Attending Physician Name and Address:  Epifanio Lesches, MD  Relative Name and Phone Number:       Current Level of Care: Hospital Recommended Level of Care: [place] Prior Approval Number:    Date Approved/Denied:   PASRR Number:  [id number])  Discharge Plan: SNF    Current Diagnoses: Patient Active Problem List   Diagnosis Date Noted  . Hip fracture, right ([place]) 05/18/2015    Orientation RESPIRATION BLADDER Height & Weight    Self, Time, Situation, Place  Normal Continent 5' (152.4 cm) 104 lbs.  BEHAVIORAL SYMPTOMS/MOOD NEUROLOGICAL BOWEL NUTRITION STATUS   (none )  (none ) Continent Diet (NPO for surgery )  AMBULATORY STATUS COMMUNICATION OF NEEDS Skin   Extensive Assist Verbally Surgical wounds, Skin abrasions (Right Knee Skin Tear)                       Personal Care Assistance Level of Assistance  Bathing, Feeding, Dressing Bathing Assistance: Limited assistance Feeding assistance: Independent Dressing Assistance: Limited assistance     Functional Limitations Info  Sight, Hearing, Speech Sight Info: Adequate Hearing Info: Adequate Speech Info: Adequate    SPECIAL CARE FACTORS FREQUENCY  PT (By licensed PT), OT (By licensed OT)     PT Frequency:  (5) OT Frequency:  (5)            Contractures      Additional Factors Info  Code Status Code Status Info:  (Full Code. )             Current Medications (05/22/2015):  This is the current hospital active medication list Current Facility-Administered Medications  Medication Dose Route Frequency  Provider Last Rate Last Dose  . acetaminophen (TYLENOL) tablet 650 mg  650 mg Oral Q6H PRN Loletha Grayer, MD   650 mg at 05/21/15 0935   Or  . acetaminophen (TYLENOL) suppository 650 mg  650 mg Rectal Q6H PRN Loletha Grayer, MD      . alum & mag hydroxide-simeth (MAALOX/MYLANTA) 200-200-20 MG/5ML suspension 30 mL  30 mL Oral Q4H PRN Hessie Knows, MD      . atorvastatin (LIPITOR) tablet 10 mg  10 mg Oral QHS Loletha Grayer, MD   10 mg at 05/21/15 2106  . azithromycin [facility]) tablet 500 mg  500 mg Oral Daily Epifanio Lesches, MD   500 mg at 05/22/15 0841  . bisacodyl (DULCOLAX) suppository 10 mg  10 mg Rectal Daily PRN Hessie Knows, MD   10 mg at 05/21/15 1404  . buPROPion [facility]) tablet 75 mg  75 mg Oral Daily Loletha Grayer, MD   75 mg at 05/22/15 0840  . cefTRIAXone (ROCEPHIN) 1 g in dextrose 5 % 50 mL IVPB  1 g Intravenous Q24H Epifanio Lesches, MD   1 g at 05/22/15 0839  . diltiazem (DILACOR XR) 24 hr capsule 120 mg  120 mg Oral Daily Loletha Grayer, MD   120 mg at 05/22/15 0839  . docusate sodium (COLACE) capsule 100 mg  100 mg Oral BID Hessie Knows, MD   100 mg at 05/22/15 0840  . donepezil (ARICEPT) tablet  5 mg  5 mg Oral QHS Loletha Grayer, MD   5 mg at 05/21/15 2106  . enoxaparin (LOVENOX) injection 30 mg  30 mg Subcutaneous Q24H Hessie Knows, MD   30 mg at 05/22/15 0839  . HYDROcodone-acetaminophen (NORCO/VICODIN) 5-325 MG per tablet 1-2 tablet  1-2 tablet Oral Q6H PRN Hessie Knows, MD   2 tablet at 05/22/15 0855  . ipratropium-albuterol (DUONEB) 0.5-2.5 (3) MG/3ML nebulizer solution 3 mL  3 mL Nebulization Q6H Epifanio Lesches, MD   3 mL at 05/22/15 0818  . irbesartan (AVAPRO) tablet 150 mg  150 mg Oral Daily Loletha Grayer, MD   150 mg at 05/22/15 0839  . levothyroxine (SYNTHROID, LEVOTHROID) tablet 100 mcg  100 mcg Oral QAC breakfast Loletha Grayer, MD   100 mcg at 05/21/15 0935  . magnesium citrate solution 1 Bottle  1 Bottle Oral Once PRN Hessie Knows, MD       . magnesium hydroxide (MILK OF MAGNESIA) suspension 30 mL  30 mL Oral Daily PRN Hessie Knows, MD   30 mL at 05/20/15 2107  . menthol-cetylpyridinium (CEPACOL) lozenge 3 mg  1 lozenge Oral PRN Hessie Knows, MD       Or  . phenol [facility]) mouth spray 1 spray  1 spray Mouth/Throat PRN Hessie Knows, MD      . metoCLOPramide (REGLAN) tablet 5-10 mg  5-10 mg Oral Q8H PRN Hessie Knows, MD       Or  . metoCLOPramide (REGLAN) injection 5-10 mg  5-10 mg Intravenous Q8H PRN Hessie Knows, MD      . mometasone-formoterol [facility]) 100-5 MCG/ACT inhaler 2 puff  2 puff Inhalation BID Loletha Grayer, MD   2 puff at 05/22/15 [phone]  . morphine 2 MG/ML injection 2-4 mg  2-4 mg Intravenous Q1H PRN Corky Mull, MD   2 mg at 05/19/15 2329  . nitroGLYCERIN (NITROSTAT) SL tablet 0.4 mg  0.4 mg Sublingual Q5 min PRN Loletha Grayer, MD      . ondansetron [facility]) tablet 4 mg  4 mg Oral Q6H PRN Hessie Knows, MD       Or  . ondansetron [facility]) injection 4 mg  4 mg Intravenous Q6H PRN Hessie Knows, MD   4 mg at 05/22/15 1032  . tiotropium (SPIRIVA) inhalation capsule 18 mcg  18 mcg Inhalation Daily Loletha Grayer, MD   18 mcg at 05/22/15 1761     Discharge Medications: Please see discharge summary for a list of discharge medications.  Relevant Imaging Results:  Relevant Lab Results:   Additional Information  (SSN: [id number])  Loralyn Freshwater, LCSW

## 2015-05-22 NOTE — Clinical Social Work Placement (Signed)
   CLINICAL SOCIAL WORK PLACEMENT  NOTE  Date:  05/22/2015  Patient Details  Name: Holly Clark MRN: [id number] Date of Birth: 1930-03-07  Clinical Social Work is seeking post-discharge placement for this patient at the [place] level of care (*CSW will initial, date and re-position this form in  chart as items are completed):  Yes   Patient/family provided with [place] Work Department's list of facilities offering this level of care within the geographic area requested by the patient (or if unable, by the patient's family).  Yes   Patient/family informed of their freedom to choose among providers that offer the needed level of care, that participate in Medicare, Medicaid or managed care program needed by the patient, have an available bed and are willing to accept the patient.  Yes   Patient/family informed of [place]'s ownership interest in [facility] and [facility], as well as of the fact that they are under no obligation to receive care at these facilities.  PASRR submitted to [employer] on 05/19/15     PASRR number received on 05/19/15     Existing PASRR number confirmed on       FL2 transmitted to all facilities in geographic area requested by pt/family on 05/20/15     FL2 transmitted to all facilities within larger geographic area on       Patient informed that his/her managed care company has contracts with or will negotiate with certain facilities, including the following:        Yes   Patient/family informed of bed offers received.  Patient chooses bed at  [facility] )     Physician recommends and patient chooses bed at      Patient to be transferred to  [facility] ) on 05/22/15.  Patient to be transferred to facility by  [facility] )     Patient family notified on 05/22/15 of transfer.  Name of family member notified:   (Patient's daughter [name] was at bedside and aware of D/C today. )      PHYSICIAN       Additional Comment:    _______________________________________________ Loralyn Freshwater, LCSW 05/22/2015, 12:53 PM

## 2015-05-22 NOTE — Progress Notes (Signed)
Physical Therapy Treatment Patient Details Name: Holly Clark MRN: [id number] DOB: 01/21/1930 Today's Date: 05/22/2015    History of Present Illness Pt admitted for R hip fracture after falling over her dog. She is now s/p cannulated hip pinning. Pt with history of COPD, depression, and dementia.     PT Comments    Pt still feeling nauseas and did not want to do any therapy initially but with encouragement was agreeable to in bed ex's.  Pt tolerated bed level ex's fairly well.  Plan to discharge to [facility] today.  Follow Up Recommendations  SNF     Equipment Recommendations       Recommendations for Other Services       Precautions / Restrictions Precautions Precautions: Fall Restrictions Weight Bearing Restrictions: Yes RLE Weight Bearing: Weight bearing as tolerated    Mobility  Bed Mobility               General bed mobility comments: Pt declined OOB d/t still feeling nauseas and with emesis earlier this morning  Transfers                    Ambulation/Gait                 Stairs            Wheelchair Mobility    Modified Rankin (Stroke Patients Only)       Balance                                    Cognition Arousal/Alertness: Awake/alert Behavior During Therapy: WFL for tasks assessed/performed Overall Cognitive Status: Within Functional Limits for tasks assessed       Memory: Decreased short-term memory              Exercises   Performed semi-supine B LE therapeutic exercise x 2x10 reps:  Ankle pumps (AROM B LE's); quad sets x3 second holds (AROM B LE's); glute squeezes x3 second holds (AROM B); SAQ's (AROM R; AROM L); heelslides (AAROM R; AROM L), hip abd/adduction (AAROM R; AROM L), and SLR (AAROM R; AROM L).  Pt required vc's and tactile cues for correct technique with exercises.    General Comments   Nursing cleared pt for participation in physical therapy.  Pt agreeable to limited PT session.       Pertinent Vitals/Pain Pain Assessment: 0-10 Pain Score: 4  Pain Location: R hip Pain Descriptors / Indicators: Aching;Sore;Tender Pain Intervention(s): Limited activity within patient's tolerance;Monitored during session;Premedicated before session;Repositioned  Vitals stable and WFL throughout treatment session.    Home Living                      Prior Function            PT Goals (current goals can now be found in the care plan section) Acute Rehab PT Goals Patient Stated Goal: to get stronger PT Goal Formulation: With patient Time For Goal Achievement: 06/03/15 Potential to Achieve Goals: Good Additional Goals Additional Goal #1: Pt will be able to perform bed mobility/transfers with min assist in order to improve functional independence Progress towards PT goals: Progressing toward goals    Frequency  BID    PT Plan Current plan remains appropriate    Co-evaluation             End of Session   Activity Tolerance: Patient  limited by pain;Patient limited by fatigue Patient left: in bed;with call bell/phone within reach;with bed alarm set;with family/visitor present;with SCD's reapplied (B heels elevated)     Time: 1100-1115 PT Time Calculation (min) (ACUTE ONLY): 15 min  Charges:  $Therapeutic Exercise: 8-22 mins                    G CodesLeitha Bleak Jun 06, 2015, 1:35 PM Leitha Bleak, [place]

## 2015-05-22 NOTE — Discharge Summary (Signed)
Holly Clark, is a 81 y.o. female  DOB 1929-11-02  MRN [id number].  Admission date:  05/18/2015  Admitting Physician  Loletha Grayer, MD  Discharge Date:  05/22/2015   Primary MD  No primary care provider on file.  Recommendations for primary care physician for things to follow:     Admission Diagnosis  Hip fracture requiring operative repair, right, closed, initial encounter [facility]) [S72.001A]   Discharge Diagnosis  Hip fracture requiring operative repair, right, closed, initial encounter ([place]) [S72.001A]    Active Problems:   Hip fracture, right [facility])      Past Medical History  Diagnosis Date  . COPD (chronic obstructive pulmonary disease) ([place])   . Depression   . Hypothyroidism   . Coronary artery disease   . Dementia     Past Surgical History  Procedure Laterality Date  . Cardiac surgery    . Abdominal hysterectomy    . Coronary artery bypass graft    . Femoral artery stent  1985    patient unsure of which side  . Cataract extraction Bilateral 2012  . Hip pinning,cannulated Right 05/19/2015    Procedure: CANNULATED HIP PINNING;  Surgeon: Hessie Knows, MD;  Location: [facility] ORS;  Service: Orthopedics;  Laterality: Right;       History of present illness and  Hospital Course:     Kindly see H&P for history of present illness and admission details, please review complete Labs, Consult reports and Test reports for all details in brief  HPI  from the history and physical done on the day of admission Holly Clark is a 81 y.o. female had a fall  And admitted for  Right hip fracture.admitted for the same.     Hospital Course  1.Right hip fracture;s/p surgery;seen by Dr.Menz., Postoperatively patient is doing well.   d Continue Lovenox for 14 days.f/u ortho in 2 weeks in 2 weeks for suture  removal 2.HTN;stable;continue cardizem,telmisartancontinue pain meds 3.acute bronchitis;; developed hypoxia and wheezing so started on nebulizers, chest x-ray negative for pneumonia. continue inhaler,augmentin,azithromycin. At discharge/ 4.hypothyroid 5.HLP 6.dementia 7.constipaton;use stool softners Discharge Condition:stable.   Follow UP  Follow-up Information    Follow up with [facility] .   Specialty:  [place] information:   [address] [place] [phone]      Follow up with MENZ,MICHAEL, MD In 2 weeks.   Specialty:  Orthopedic Surgery   Why:  For staple removal and skin check   Contact information:   [place] [address] [phone]         Discharge Instructions  and  Discharge Medications       Medication List    TAKE these medications        albuterol 108 (90 BASE) MCG/ACT inhaler  Commonly known as:  PROVENTIL HFA;VENTOLIN HFA  Inhale 2 puffs into the lungs every 6 (six) hours as needed for wheezing or shortness of breath.     alum & mag hydroxide-simeth 200-200-20 MG/5ML suspension  Commonly known as:  MAALOX/MYLANTA  Take 30 mLs by mouth every 4 (four) hours as needed for indigestion.     amoxicillin-clavulanate 875-125 MG tablet  Commonly known as:  AUGMENTIN  Take 1 tablet by mouth 2 (two) times daily.     ANORO ELLIPTA 62.5-25 MCG/INH Aepb  Generic drug:  Umeclidinium-Vilanterol  Inhale 1 puff into the lungs daily.     aspirin EC 81  MG tablet  Take 81 mg by mouth daily.     azithromycin 500 MG tablet  Commonly known as:  ZITHROMAX  Take 1 tablet (500 mg total) by mouth daily.     bisacodyl 10 MG suppository  Commonly known as:  DULCOLAX  Place 1 suppository (10 mg total) rectally daily as needed for moderate constipation.     buPROPion 75 MG tablet  Commonly known as:  WELLBUTRIN  Take 75 mg by mouth daily.      diltiazem 120 MG 24 hr capsule  Commonly known as:  DILACOR XR  Take 120 mg by mouth daily.     docusate sodium 100 MG capsule  Commonly known as:  COLACE  Take 1 capsule (100 mg total) by mouth 2 (two) times daily.     donepezil 5 MG tablet  Commonly known as:  ARICEPT  Take 5 mg by mouth at bedtime.     enoxaparin 30 MG/0.3ML injection  Commonly known as:  LOVENOX  Inject 0.3 mLs (30 mg total) into the skin daily.     HYDROcodone-acetaminophen 5-325 MG tablet  Commonly known as:  NORCO/VICODIN  Take 1-2 tablets by mouth every 6 (six) hours as needed for moderate pain.     levothyroxine 100 MCG tablet  Commonly known as:  SYNTHROID, LEVOTHROID  Take 100 mcg by mouth daily before breakfast.     magnesium hydroxide 400 MG/5ML suspension  Commonly known as:  MILK OF MAGNESIA  Take 30 mLs by mouth daily as needed for mild constipation.     nitroGLYCERIN 0.4 MG SL tablet  Commonly known as:  NITROSTAT  Place 0.4 mg under the tongue every 5 (five) minutes as needed for chest pain.     simvastatin 20 MG tablet  Commonly known as:  ZOCOR  Take 20 mg by mouth at bedtime.     telmisartan 40 MG tablet  Commonly known as:  MICARDIS  Take 40 mg by mouth daily.          Diet and Activity recommendation: See Discharge Instructions above   Consults obtained - ortho   Major procedures and Radiology Reports - PLEASE review detailed and final reports for all details, in brief -     Dg Chest 1 View  05/19/2015  CLINICAL DATA:  Wheezing and hypoxia EXAM: CHEST  1 VIEW COMPARISON:  05/18/2015 FINDINGS: Cardiac shadow is stable. Postoperative changes are noted. A hiatal hernia is again seen. Aortic atherosclerotic changes again noted. No significant infiltrate or edema is seen. IMPRESSION: No active disease. Electronically Signed   By: Inez Catalina M.D.   On: 05/19/2015 21:20   Dg Chest 1 View  05/18/2015  CLINICAL DATA:  Right hip pain since a fall four days ago. Right  hip fracture. EXAM: CHEST 1 VIEW COMPARISON:  09/30/2013 FINDINGS: Heart size and pulmonary vascularity are normal and the lungs are clear. Prior CABG. Aortic atherosclerosis. Moderate hiatal hernia. No acute abnormalities. IMPRESSION: No acute abnormalities. Moderate hiatal hernia. Aortic atherosclerosis. Electronically Signed   By: Lorriane Shire M.D.   On: 05/18/2015 16:01   Ct Head Wo Contrast  05/18/2015  CLINICAL DATA:  Status post fall.  Facial bruising. EXAM: CT HEAD WITHOUT CONTRAST CT MAXILLOFACIAL WITHOUT CONTRAST CT CERVICAL SPINE WITHOUT CONTRAST TECHNIQUE: Multidetector CT imaging of the head, cervical spine, and maxillofacial structures were performed using the standard protocol without intravenous contrast. Multiplanar CT image reconstructions of the cervical spine and maxillofacial structures were also generated. COMPARISON:  None. FINDINGS:  CT HEAD FINDINGS There is low attenuation within the subcortical and periventricular white matter compatible with chronic microvascular disease. Prominence of the sulci and ventricles identified consistent with brain atrophy. There is no evidence for acute cortical infarct, intracranial hemorrhage or mass. The paranasal sinuses and the mastoid air cells appear clear. The calvarium is intact. CT MAXILLOFACIAL FINDINGS The paranasal sinuses are clear. The nasal bone is intact. The zygomatic arches are intact. Mandible is located and intact. No evidence for orbital fracture. CT CERVICAL SPINE FINDINGS There is straightening of normal cervical lordosis. The vertebral body heights are well preserved. The prevertebral soft tissue space appears normal. Multi level disc space narrowing and ventral endplate spurring is noted this is most advanced at C4-5 and C5-6. The facet joints appear all well aligned. No fractures or subluxations. Calcified left cervical lymph nodes identified. IMPRESSION: 1. No acute intracranial abnormalities. 2. Small vessel ischemic disease  and brain atrophy. 3. No evidence for facial bone fracture. 4. No evidence for cervical spine fracture. 5. Cervical spondylosis. Electronically Signed   By: Kerby Moors M.D.   On: 05/18/2015 16:46   Ct Cervical Spine Wo Contrast  05/18/2015  CLINICAL DATA:  Status post fall.  Facial bruising. EXAM: CT HEAD WITHOUT CONTRAST CT MAXILLOFACIAL WITHOUT CONTRAST CT CERVICAL SPINE WITHOUT CONTRAST TECHNIQUE: Multidetector CT imaging of the head, cervical spine, and maxillofacial structures were performed using the standard protocol without intravenous contrast. Multiplanar CT image reconstructions of the cervical spine and maxillofacial structures were also generated. COMPARISON:  None. FINDINGS: CT HEAD FINDINGS There is low attenuation within the subcortical and periventricular white matter compatible with chronic microvascular disease. Prominence of the sulci and ventricles identified consistent with brain atrophy. There is no evidence for acute cortical infarct, intracranial hemorrhage or mass. The paranasal sinuses and the mastoid air cells appear clear. The calvarium is intact. CT MAXILLOFACIAL FINDINGS The paranasal sinuses are clear. The nasal bone is intact. The zygomatic arches are intact. Mandible is located and intact. No evidence for orbital fracture. CT CERVICAL SPINE FINDINGS There is straightening of normal cervical lordosis. The vertebral body heights are well preserved. The prevertebral soft tissue space appears normal. Multi level disc space narrowing and ventral endplate spurring is noted this is most advanced at C4-5 and C5-6. The facet joints appear all well aligned. No fractures or subluxations. Calcified left cervical lymph nodes identified. IMPRESSION: 1. No acute intracranial abnormalities. 2. Small vessel ischemic disease and brain atrophy. 3. No evidence for facial bone fracture. 4. No evidence for cervical spine fracture. 5. Cervical spondylosis. Electronically Signed   By: Kerby Moors M.D.   On: 05/18/2015 16:46   Dg Knee Complete 4 Views Right  05/18/2015  CLINICAL DATA:  Pt presents to ER with Fall that happened Thursday due to tripping over a cord and landed on concrete. Pt presents with facial bruising , right hip and knee pain that she is unable to bear weigh on since then, and wounds on lower extremities. EXAM: RIGHT KNEE - COMPLETE 4+ VIEW COMPARISON:  None. FINDINGS: No fracture or dislocation. There are prominent calcifications along the joint cartilage and menisci. The knee joint is normally spaced and aligned. Bones are diffusely demineralized. No joint effusion. There are vascular calcifications posteriorly as well as a vascular stent along the femoral artery. IMPRESSION: 1. No fracture or dislocation.  No acute finding. Electronically Signed   By: Lajean Manes M.D.   On: 05/18/2015 16:54   Dg Hip Operative Unilat With Pelvis  Right  05/19/2015  CLINICAL DATA:  Fixation of right hip fracture.  Initial encounter. EXAM: OPERATIVE RIGHT HIP (WITH PELVIS IF PERFORMED) 1 VIEW TECHNIQUE: Fluoroscopic spot image(s) were submitted for interpretation post-operatively. COMPARISON:  Plain films of the right hip 05/18/2015. FINDINGS: Single intraoperative fluoroscopic spot views demonstrates 3 fixation screws for fixation of a subcapital right hip fracture. The screws appear well-positioned. No acute abnormality is identified. IMPRESSION: ORIF right subcapital hip fracture without evidence of complication. Electronically Signed   By: Inge Rise M.D.   On: 05/19/2015 14:27   Dg Hip Unilat  With Pelvis 2-3 Views Right  05/18/2015  CLINICAL DATA:  Unable to bear weight. Right hip pain. Status post fall. EXAM: DG HIP (WITH OR WITHOUT PELVIS) 2-3V RIGHT COMPARISON:  None. FINDINGS: Impacted subcapital right hip fracture.  No dislocation. Generalized osteopenia.  No left hip fracture or dislocation. Peripheral vascular atherosclerotic disease. IMPRESSION: Impacted subcapital  right hip fracture. Electronically Signed   By: Kathreen Devoid   On: 05/18/2015 15:59   Ct Maxillofacial Wo Cm  05/18/2015  CLINICAL DATA:  Status post fall.  Facial bruising. EXAM: CT HEAD WITHOUT CONTRAST CT MAXILLOFACIAL WITHOUT CONTRAST CT CERVICAL SPINE WITHOUT CONTRAST TECHNIQUE: Multidetector CT imaging of the head, cervical spine, and maxillofacial structures were performed using the standard protocol without intravenous contrast. Multiplanar CT image reconstructions of the cervical spine and maxillofacial structures were also generated. COMPARISON:  None. FINDINGS: CT HEAD FINDINGS There is low attenuation within the subcortical and periventricular white matter compatible with chronic microvascular disease. Prominence of the sulci and ventricles identified consistent with brain atrophy. There is no evidence for acute cortical infarct, intracranial hemorrhage or mass. The paranasal sinuses and the mastoid air cells appear clear. The calvarium is intact. CT MAXILLOFACIAL FINDINGS The paranasal sinuses are clear. The nasal bone is intact. The zygomatic arches are intact. Mandible is located and intact. No evidence for orbital fracture. CT CERVICAL SPINE FINDINGS There is straightening of normal cervical lordosis. The vertebral body heights are well preserved. The prevertebral soft tissue space appears normal. Multi level disc space narrowing and ventral endplate spurring is noted this is most advanced at C4-5 and C5-6. The facet joints appear all well aligned. No fractures or subluxations. Calcified left cervical lymph nodes identified. IMPRESSION: 1. No acute intracranial abnormalities. 2. Small vessel ischemic disease and brain atrophy. 3. No evidence for facial bone fracture. 4. No evidence for cervical spine fracture. 5. Cervical spondylosis. Electronically Signed   By: Kerby Moors M.D.   On: 05/18/2015 16:46    Micro Results    Recent Results (from the past 240 hour(s))  Surgical pcr screen      Status: None   Collection Time: 05/18/15  6:45 PM  Result Value Ref Range Status   MRSA, PCR NEGATIVE NEGATIVE Final   Staphylococcus aureus NEGATIVE NEGATIVE Final    Comment:        The Xpert SA Assay (FDA approved for NASAL specimens in patients over 49 years of age), is one component of a comprehensive surveillance program.  Test performance has been validated by [facility] for patients greater than or equal to 47 year old. It is not intended to diagnose infection nor to guide or monitor treatment.        Today   Subjective:   Holly Clark today has no headache,no chest abdominal pain,no new weakness tingling or numbness,stable to go to rehab .  Objective:   Blood pressure 155/57, pulse 87, temperature 98.2  F (36.8 C), temperature source Oral, resp. rate 18, height 5' (1.524 m), weight 51.03 kg (112 lb 8 oz), SpO2 100 %.   Intake/Output Summary (Last 24 hours) at 05/22/15 0938 Last data filed at 05/21/15 1828  Gross per 24 hour  Intake 143.33 ml  Output      0 ml  Net 143.33 ml    Exam Awake Alert, Oriented x 3, No new F.N deficits, Normal affect [place].AT,PERRAL Supple Neck,No JVD, No cervical lymphadenopathy appriciated.  Symmetrical Chest wall movement, Good air movement bilaterally, CTAB RRR,No Gallops,Rubs or new Murmurs, No Parasternal Heave +ve B.Sounds, Abd Soft, Non tender, No organomegaly appriciated, No rebound -guarding or rigidity. No Cyanosis, Clubbing or edema, No new Rash or bruise  Data Review   CBC w Diff: Lab Results  Component Value Date   WBC 7.4 05/20/2015   WBC 6.7 10/01/2013   HGB 12.4 05/20/2015   HGB 11.9* 10/01/2013   HCT 36.2 05/20/2015   HCT 35.5 10/01/2013   PLT 181 05/20/2015   PLT 177 10/01/2013   LYMPHOPCT 6 05/18/2015   LYMPHOPCT 20.0 10/01/2013   MONOPCT 19 05/18/2015   MONOPCT 14.6 10/01/2013   EOSPCT 1 05/18/2015   EOSPCT 0.9 10/01/2013   BASOPCT 0 05/18/2015   BASOPCT 0.5 10/01/2013    CMP: Lab Results   Component Value Date   NA 135 05/20/2015   NA 134* 10/01/2013   K 4.2 05/20/2015   K 4.4 10/01/2013   CL 100* 05/20/2015   CL 100 10/01/2013   CO2 25 05/20/2015   CO2 30 10/01/2013   BUN 21* 05/20/2015   BUN 15 10/01/2013   CREATININE 1.22* 05/20/2015   CREATININE 0.80 10/01/2013   PROT 7.3 05/18/2015   ALBUMIN 4.1 05/18/2015   BILITOT 0.8 05/18/2015   ALKPHOS 98 05/18/2015   AST 28 05/18/2015   ALT 21 05/18/2015  .   Total Time in preparing paper work, data evaluation and todays exam - 49 minutes  Emoree Sasaki M.D on 05/22/2015 at 9:38 AM    Note: This dictation was prepared with Dragon dictation along with smaller phrase technology. Any transcriptional errors that result from this process are unintentional.

## 2015-05-22 NOTE — Progress Notes (Signed)
   Subjective: 3 Days Post-Op Procedure(s) (LRB): CANNULATED HIP PINNING (Right) Patient reports pain as mild.   Patient is well, and has had no acute complaints or problems We will continue therapy today.  Plan is to go Rehab after hospital stay.  Objective: Vital signs in last 24 hours: Temp:  [97.5 F (36.4 C)-98.9 F (37.2 C)] 98.2 F (36.8 C) (12/16 0819) Pulse Rate:  [78-112] 87 (12/16 0819) Resp:  [18] 18 (12/16 0819) BP: (94-155)/(45-59) 155/57 mmHg (12/16 0819) SpO2:  [85 %-100 %] 100 % (12/16 0819) FiO2 (%):  [21 %-28 %] 21 % (12/15 1514)  Intake/Output from previous day: 12/15 0701 - 12/16 0700 In: 475 [P.O.:120; I.V.:355] Out: 200 [Urine:200] Intake/Output this shift:     Recent Labs  05/20/15 0613  HGB 12.4    Recent Labs  05/20/15 0613  WBC 7.4  RBC 3.95  HCT 36.2  PLT 181    Recent Labs  05/20/15 0613  NA 135  K 4.2  CL 100*  CO2 25  BUN 21*  CREATININE 1.22*  GLUCOSE 120*  CALCIUM 8.7*   No results for input(s): LABPT, INR in the last 72 hours.  EXAM General - Patient is Alert, Appropriate and Oriented Extremity - Neurovascular intact Sensation intact distally Intact pulses distally Dorsiflexion/Plantar flexion intact Dressing - dressing C/D/I and no drainage Motor Function - intact, moving foot and toes well on exam.   Past Medical History  Diagnosis Date  . COPD (chronic obstructive pulmonary disease) ([place])   . Depression   . Hypothyroidism   . Coronary artery disease   . Dementia     Assessment/Plan:   3 Days Post-Op Procedure(s) (LRB): CANNULATED HIP PINNING (Right) Active Problems:   Hip fracture, right (H[facility])  Estimated body mass index is 21.97 kg/(m^2) as calculated from the following:   Height as of this encounter: 5' (1.524 m).   Weight as of this encounter: 51.03 kg (112 lb 8 oz). Advance diet Up with therapy , WBAT RLE Plan on discharge today to SNF  Follow up with [facility] in 2 weeks for staple  removal    DVT Prophylaxis - Lovenox, Foot Pumps and TED hose Weight-Bearing as tolerated to right leg D/C O2 and Pulse OX and try on Room Air  T. Rachelle Hora, PA-C [place] 05/22/2015, 8:24 AM

## 2015-05-22 NOTE — Progress Notes (Signed)
Patient has n/v after rx & pain meds. Administered iv zofran and patient states improvement.

## 2015-05-25 LAB — CBC WITH DIFFERENTIAL/PLATELET
BASOS PCT: 1 %
Basophils Absolute: 0.1 10*3/uL (ref 0–0.1)
EOS PCT: 4 %
Eosinophils Absolute: 0.3 10*3/uL (ref 0–0.7)
HEMATOCRIT: 35.3 % (ref 35.0–47.0)
HEMOGLOBIN: 11.7 g/dL — AB (ref 12.0–16.0)
LYMPHS PCT: 20 %
Lymphs Abs: 1.3 10*3/uL (ref 1.0–3.6)
MCH: 30.1 pg (ref 26.0–34.0)
MCHC: 33.1 g/dL (ref 32.0–36.0)
MCV: 90.8 fL (ref 80.0–100.0)
MONOS PCT: 17 %
Monocytes Absolute: 1.1 10*3/uL — ABNORMAL HIGH (ref 0.2–0.9)
NEUTROS ABS: 3.6 10*3/uL (ref 1.4–6.5)
NEUTROS PCT: 58 %
Platelets: 307 10*3/uL (ref 150–440)
RBC: 3.89 MIL/uL (ref 3.80–5.20)
RDW: 13.3 % (ref 11.5–14.5)
WBC: 6.4 10*3/uL (ref 3.6–11.0)

## 2015-05-28 LAB — COMPREHENSIVE METABOLIC PANEL
ALBUMIN: 3.4 g/dL — AB (ref 3.5–5.0)
ALT: 53 U/L (ref 14–54)
AST: 47 U/L — AB (ref 15–41)
Alkaline Phosphatase: 92 U/L (ref 38–126)
Anion gap: 8 (ref 5–15)
BUN: 19 mg/dL (ref 6–20)
CHLORIDE: 101 mmol/L (ref 101–111)
CO2: 24 mmol/L (ref 22–32)
CREATININE: 1.02 mg/dL — AB (ref 0.44–1.00)
Calcium: 8.8 mg/dL — ABNORMAL LOW (ref 8.9–10.3)
GFR calc Af Amer: 57 mL/min — ABNORMAL LOW (ref >60)
GFR, EST NON AFRICAN AMERICAN: 49 mL/min — AB (ref >60)
GLUCOSE: 142 mg/dL — AB (ref 65–99)
Potassium: 4.1 mmol/L (ref 3.5–5.1)
SODIUM: 133 mmol/L — AB (ref 135–145)
Total Bilirubin: 0.4 mg/dL (ref 0.3–1.2)
Total Protein: 6.5 g/dL (ref 6.5–8.1)

## 2015-05-28 LAB — CBC WITH DIFFERENTIAL/PLATELET
BASOS ABS: 0.1 10*3/uL (ref 0–0.1)
Basophils Relative: 1 %
Eosinophils Absolute: 0.3 10*3/uL (ref 0–0.7)
Eosinophils Relative: 4 %
HCT: 34.4 % — ABNORMAL LOW (ref 35.0–47.0)
Hemoglobin: 11.6 g/dL — ABNORMAL LOW (ref 12.0–16.0)
LYMPHS ABS: 1.1 10*3/uL (ref 1.0–3.6)
Lymphocytes Relative: 14 %
MCH: 30.9 pg (ref 26.0–34.0)
MCHC: 33.8 g/dL (ref 32.0–36.0)
MCV: 91.6 fL (ref 80.0–100.0)
MONOS PCT: 13 %
Monocytes Absolute: 1.1 10*3/uL — ABNORMAL HIGH (ref 0.2–0.9)
Neutro Abs: 5.5 10*3/uL (ref 1.4–6.5)
Neutrophils Relative %: 68 %
PLATELETS: 362 10*3/uL (ref 150–440)
RBC: 3.75 MIL/uL — AB (ref 3.80–5.20)
RDW: 13.3 % (ref 11.5–14.5)
WBC: 8.1 10*3/uL (ref 3.6–11.0)

## 2015-06-09 DIAGNOSIS — M11261 Other chondrocalcinosis, right knee: Secondary | ICD-10-CM | POA: Diagnosis not present

## 2015-06-09 DIAGNOSIS — I2510 Atherosclerotic heart disease of native coronary artery without angina pectoris: Secondary | ICD-10-CM | POA: Diagnosis not present

## 2015-06-09 DIAGNOSIS — Z9889 Other specified postprocedural states: Secondary | ICD-10-CM | POA: Diagnosis not present

## 2015-06-09 DIAGNOSIS — F329 Major depressive disorder, single episode, unspecified: Secondary | ICD-10-CM | POA: Diagnosis not present

## 2015-06-09 DIAGNOSIS — I10 Essential (primary) hypertension: Secondary | ICD-10-CM | POA: Diagnosis not present

## 2015-06-09 DIAGNOSIS — S72001D Fracture of unspecified part of neck of right femur, subsequent encounter for closed fracture with routine healing: Secondary | ICD-10-CM | POA: Diagnosis not present

## 2015-06-09 DIAGNOSIS — Z8781 Personal history of (healed) traumatic fracture: Secondary | ICD-10-CM | POA: Diagnosis not present

## 2015-06-09 DIAGNOSIS — E039 Hypothyroidism, unspecified: Secondary | ICD-10-CM | POA: Diagnosis not present

## 2015-06-09 DIAGNOSIS — I739 Peripheral vascular disease, unspecified: Secondary | ICD-10-CM | POA: Diagnosis not present

## 2015-06-09 DIAGNOSIS — J449 Chronic obstructive pulmonary disease, unspecified: Secondary | ICD-10-CM | POA: Diagnosis not present

## 2015-06-09 DIAGNOSIS — M810 Age-related osteoporosis without current pathological fracture: Secondary | ICD-10-CM | POA: Diagnosis not present

## 2015-06-09 DIAGNOSIS — F0390 Unspecified dementia without behavioral disturbance: Secondary | ICD-10-CM | POA: Diagnosis not present

## 2015-06-10 DIAGNOSIS — S72001D Fracture of unspecified part of neck of right femur, subsequent encounter for closed fracture with routine healing: Secondary | ICD-10-CM | POA: Diagnosis not present

## 2015-06-10 DIAGNOSIS — I10 Essential (primary) hypertension: Secondary | ICD-10-CM | POA: Diagnosis not present

## 2015-06-10 DIAGNOSIS — I739 Peripheral vascular disease, unspecified: Secondary | ICD-10-CM | POA: Diagnosis not present

## 2015-06-10 DIAGNOSIS — I2510 Atherosclerotic heart disease of native coronary artery without angina pectoris: Secondary | ICD-10-CM | POA: Diagnosis not present

## 2015-06-10 DIAGNOSIS — F329 Major depressive disorder, single episode, unspecified: Secondary | ICD-10-CM | POA: Diagnosis not present

## 2015-06-10 DIAGNOSIS — F0390 Unspecified dementia without behavioral disturbance: Secondary | ICD-10-CM | POA: Diagnosis not present

## 2015-06-10 DIAGNOSIS — M810 Age-related osteoporosis without current pathological fracture: Secondary | ICD-10-CM | POA: Diagnosis not present

## 2015-06-10 DIAGNOSIS — J449 Chronic obstructive pulmonary disease, unspecified: Secondary | ICD-10-CM | POA: Diagnosis not present

## 2015-06-10 DIAGNOSIS — E039 Hypothyroidism, unspecified: Secondary | ICD-10-CM | POA: Diagnosis not present

## 2015-06-12 DIAGNOSIS — F0390 Unspecified dementia without behavioral disturbance: Secondary | ICD-10-CM | POA: Diagnosis not present

## 2015-06-12 DIAGNOSIS — J449 Chronic obstructive pulmonary disease, unspecified: Secondary | ICD-10-CM | POA: Diagnosis not present

## 2015-06-12 DIAGNOSIS — F329 Major depressive disorder, single episode, unspecified: Secondary | ICD-10-CM | POA: Diagnosis not present

## 2015-06-12 DIAGNOSIS — E039 Hypothyroidism, unspecified: Secondary | ICD-10-CM | POA: Diagnosis not present

## 2015-06-12 DIAGNOSIS — I739 Peripheral vascular disease, unspecified: Secondary | ICD-10-CM | POA: Diagnosis not present

## 2015-06-12 DIAGNOSIS — I2510 Atherosclerotic heart disease of native coronary artery without angina pectoris: Secondary | ICD-10-CM | POA: Diagnosis not present

## 2015-06-12 DIAGNOSIS — S72001D Fracture of unspecified part of neck of right femur, subsequent encounter for closed fracture with routine healing: Secondary | ICD-10-CM | POA: Diagnosis not present

## 2015-06-12 DIAGNOSIS — M810 Age-related osteoporosis without current pathological fracture: Secondary | ICD-10-CM | POA: Diagnosis not present

## 2015-06-12 DIAGNOSIS — I10 Essential (primary) hypertension: Secondary | ICD-10-CM | POA: Diagnosis not present

## 2015-06-15 DIAGNOSIS — I10 Essential (primary) hypertension: Secondary | ICD-10-CM | POA: Diagnosis not present

## 2015-06-15 DIAGNOSIS — S72001D Fracture of unspecified part of neck of right femur, subsequent encounter for closed fracture with routine healing: Secondary | ICD-10-CM | POA: Diagnosis not present

## 2015-06-15 DIAGNOSIS — I2510 Atherosclerotic heart disease of native coronary artery without angina pectoris: Secondary | ICD-10-CM | POA: Diagnosis not present

## 2015-06-15 DIAGNOSIS — F0390 Unspecified dementia without behavioral disturbance: Secondary | ICD-10-CM | POA: Diagnosis not present

## 2015-06-15 DIAGNOSIS — F329 Major depressive disorder, single episode, unspecified: Secondary | ICD-10-CM | POA: Diagnosis not present

## 2015-06-15 DIAGNOSIS — J449 Chronic obstructive pulmonary disease, unspecified: Secondary | ICD-10-CM | POA: Diagnosis not present

## 2015-06-15 DIAGNOSIS — E039 Hypothyroidism, unspecified: Secondary | ICD-10-CM | POA: Diagnosis not present

## 2015-06-15 DIAGNOSIS — I739 Peripheral vascular disease, unspecified: Secondary | ICD-10-CM | POA: Diagnosis not present

## 2015-06-15 DIAGNOSIS — M810 Age-related osteoporosis without current pathological fracture: Secondary | ICD-10-CM | POA: Diagnosis not present

## 2015-06-16 DIAGNOSIS — F329 Major depressive disorder, single episode, unspecified: Secondary | ICD-10-CM | POA: Diagnosis not present

## 2015-06-16 DIAGNOSIS — I10 Essential (primary) hypertension: Secondary | ICD-10-CM | POA: Diagnosis not present

## 2015-06-16 DIAGNOSIS — F0390 Unspecified dementia without behavioral disturbance: Secondary | ICD-10-CM | POA: Diagnosis not present

## 2015-06-16 DIAGNOSIS — M810 Age-related osteoporosis without current pathological fracture: Secondary | ICD-10-CM | POA: Diagnosis not present

## 2015-06-16 DIAGNOSIS — I739 Peripheral vascular disease, unspecified: Secondary | ICD-10-CM | POA: Diagnosis not present

## 2015-06-16 DIAGNOSIS — S72001D Fracture of unspecified part of neck of right femur, subsequent encounter for closed fracture with routine healing: Secondary | ICD-10-CM | POA: Diagnosis not present

## 2015-06-16 DIAGNOSIS — J449 Chronic obstructive pulmonary disease, unspecified: Secondary | ICD-10-CM | POA: Diagnosis not present

## 2015-06-16 DIAGNOSIS — E039 Hypothyroidism, unspecified: Secondary | ICD-10-CM | POA: Diagnosis not present

## 2015-06-16 DIAGNOSIS — I2510 Atherosclerotic heart disease of native coronary artery without angina pectoris: Secondary | ICD-10-CM | POA: Diagnosis not present

## 2015-06-17 DIAGNOSIS — E039 Hypothyroidism, unspecified: Secondary | ICD-10-CM | POA: Diagnosis not present

## 2015-06-17 DIAGNOSIS — F329 Major depressive disorder, single episode, unspecified: Secondary | ICD-10-CM | POA: Diagnosis not present

## 2015-06-17 DIAGNOSIS — I2510 Atherosclerotic heart disease of native coronary artery without angina pectoris: Secondary | ICD-10-CM | POA: Diagnosis not present

## 2015-06-17 DIAGNOSIS — I10 Essential (primary) hypertension: Secondary | ICD-10-CM | POA: Diagnosis not present

## 2015-06-17 DIAGNOSIS — M810 Age-related osteoporosis without current pathological fracture: Secondary | ICD-10-CM | POA: Diagnosis not present

## 2015-06-17 DIAGNOSIS — S72001D Fracture of unspecified part of neck of right femur, subsequent encounter for closed fracture with routine healing: Secondary | ICD-10-CM | POA: Diagnosis not present

## 2015-06-17 DIAGNOSIS — I739 Peripheral vascular disease, unspecified: Secondary | ICD-10-CM | POA: Diagnosis not present

## 2015-06-17 DIAGNOSIS — J449 Chronic obstructive pulmonary disease, unspecified: Secondary | ICD-10-CM | POA: Diagnosis not present

## 2015-06-17 DIAGNOSIS — F0390 Unspecified dementia without behavioral disturbance: Secondary | ICD-10-CM | POA: Diagnosis not present

## 2015-06-19 DIAGNOSIS — E039 Hypothyroidism, unspecified: Secondary | ICD-10-CM | POA: Diagnosis not present

## 2015-06-19 DIAGNOSIS — F329 Major depressive disorder, single episode, unspecified: Secondary | ICD-10-CM | POA: Diagnosis not present

## 2015-06-19 DIAGNOSIS — J449 Chronic obstructive pulmonary disease, unspecified: Secondary | ICD-10-CM | POA: Diagnosis not present

## 2015-06-19 DIAGNOSIS — F0390 Unspecified dementia without behavioral disturbance: Secondary | ICD-10-CM | POA: Diagnosis not present

## 2015-06-19 DIAGNOSIS — I10 Essential (primary) hypertension: Secondary | ICD-10-CM | POA: Diagnosis not present

## 2015-06-19 DIAGNOSIS — I2510 Atherosclerotic heart disease of native coronary artery without angina pectoris: Secondary | ICD-10-CM | POA: Diagnosis not present

## 2015-06-19 DIAGNOSIS — S72001D Fracture of unspecified part of neck of right femur, subsequent encounter for closed fracture with routine healing: Secondary | ICD-10-CM | POA: Diagnosis not present

## 2015-06-19 DIAGNOSIS — M810 Age-related osteoporosis without current pathological fracture: Secondary | ICD-10-CM | POA: Diagnosis not present

## 2015-06-19 DIAGNOSIS — I739 Peripheral vascular disease, unspecified: Secondary | ICD-10-CM | POA: Diagnosis not present

## 2015-06-20 LAB — TYPE AND SCREEN
ABO/RH(D): O POS
ANTIBODY SCREEN: POSITIVE
Antibody Identification: NOT DETECTED

## 2015-06-22 DIAGNOSIS — J449 Chronic obstructive pulmonary disease, unspecified: Secondary | ICD-10-CM | POA: Diagnosis not present

## 2015-06-22 DIAGNOSIS — E039 Hypothyroidism, unspecified: Secondary | ICD-10-CM | POA: Diagnosis not present

## 2015-06-22 DIAGNOSIS — F329 Major depressive disorder, single episode, unspecified: Secondary | ICD-10-CM | POA: Diagnosis not present

## 2015-06-22 DIAGNOSIS — F0390 Unspecified dementia without behavioral disturbance: Secondary | ICD-10-CM | POA: Diagnosis not present

## 2015-06-22 DIAGNOSIS — M810 Age-related osteoporosis without current pathological fracture: Secondary | ICD-10-CM | POA: Diagnosis not present

## 2015-06-22 DIAGNOSIS — I2510 Atherosclerotic heart disease of native coronary artery without angina pectoris: Secondary | ICD-10-CM | POA: Diagnosis not present

## 2015-06-22 DIAGNOSIS — I10 Essential (primary) hypertension: Secondary | ICD-10-CM | POA: Diagnosis not present

## 2015-06-22 DIAGNOSIS — I739 Peripheral vascular disease, unspecified: Secondary | ICD-10-CM | POA: Diagnosis not present

## 2015-06-22 DIAGNOSIS — S72001D Fracture of unspecified part of neck of right femur, subsequent encounter for closed fracture with routine healing: Secondary | ICD-10-CM | POA: Diagnosis not present

## 2015-06-25 DIAGNOSIS — I739 Peripheral vascular disease, unspecified: Secondary | ICD-10-CM | POA: Diagnosis not present

## 2015-06-25 DIAGNOSIS — I10 Essential (primary) hypertension: Secondary | ICD-10-CM | POA: Diagnosis not present

## 2015-06-25 DIAGNOSIS — F0390 Unspecified dementia without behavioral disturbance: Secondary | ICD-10-CM | POA: Diagnosis not present

## 2015-06-25 DIAGNOSIS — J449 Chronic obstructive pulmonary disease, unspecified: Secondary | ICD-10-CM | POA: Diagnosis not present

## 2015-06-25 DIAGNOSIS — S72001D Fracture of unspecified part of neck of right femur, subsequent encounter for closed fracture with routine healing: Secondary | ICD-10-CM | POA: Diagnosis not present

## 2015-06-25 DIAGNOSIS — E039 Hypothyroidism, unspecified: Secondary | ICD-10-CM | POA: Diagnosis not present

## 2015-06-25 DIAGNOSIS — I2510 Atherosclerotic heart disease of native coronary artery without angina pectoris: Secondary | ICD-10-CM | POA: Diagnosis not present

## 2015-06-25 DIAGNOSIS — F329 Major depressive disorder, single episode, unspecified: Secondary | ICD-10-CM | POA: Diagnosis not present

## 2015-06-25 DIAGNOSIS — M810 Age-related osteoporosis without current pathological fracture: Secondary | ICD-10-CM | POA: Diagnosis not present

## 2015-06-30 DIAGNOSIS — S72001D Fracture of unspecified part of neck of right femur, subsequent encounter for closed fracture with routine healing: Secondary | ICD-10-CM | POA: Diagnosis not present

## 2015-06-30 DIAGNOSIS — I739 Peripheral vascular disease, unspecified: Secondary | ICD-10-CM | POA: Diagnosis not present

## 2015-06-30 DIAGNOSIS — E039 Hypothyroidism, unspecified: Secondary | ICD-10-CM | POA: Diagnosis not present

## 2015-06-30 DIAGNOSIS — F0390 Unspecified dementia without behavioral disturbance: Secondary | ICD-10-CM | POA: Diagnosis not present

## 2015-06-30 DIAGNOSIS — F329 Major depressive disorder, single episode, unspecified: Secondary | ICD-10-CM | POA: Diagnosis not present

## 2015-06-30 DIAGNOSIS — M810 Age-related osteoporosis without current pathological fracture: Secondary | ICD-10-CM | POA: Diagnosis not present

## 2015-06-30 DIAGNOSIS — I2510 Atherosclerotic heart disease of native coronary artery without angina pectoris: Secondary | ICD-10-CM | POA: Diagnosis not present

## 2015-06-30 DIAGNOSIS — J449 Chronic obstructive pulmonary disease, unspecified: Secondary | ICD-10-CM | POA: Diagnosis not present

## 2015-06-30 DIAGNOSIS — I10 Essential (primary) hypertension: Secondary | ICD-10-CM | POA: Diagnosis not present

## 2015-07-07 DIAGNOSIS — J449 Chronic obstructive pulmonary disease, unspecified: Secondary | ICD-10-CM | POA: Diagnosis not present

## 2015-07-07 DIAGNOSIS — F0390 Unspecified dementia without behavioral disturbance: Secondary | ICD-10-CM | POA: Diagnosis not present

## 2015-07-07 DIAGNOSIS — M810 Age-related osteoporosis without current pathological fracture: Secondary | ICD-10-CM | POA: Diagnosis not present

## 2015-07-07 DIAGNOSIS — I10 Essential (primary) hypertension: Secondary | ICD-10-CM | POA: Diagnosis not present

## 2015-07-07 DIAGNOSIS — E039 Hypothyroidism, unspecified: Secondary | ICD-10-CM | POA: Diagnosis not present

## 2015-07-07 DIAGNOSIS — I739 Peripheral vascular disease, unspecified: Secondary | ICD-10-CM | POA: Diagnosis not present

## 2015-07-07 DIAGNOSIS — F329 Major depressive disorder, single episode, unspecified: Secondary | ICD-10-CM | POA: Diagnosis not present

## 2015-07-07 DIAGNOSIS — I2510 Atherosclerotic heart disease of native coronary artery without angina pectoris: Secondary | ICD-10-CM | POA: Diagnosis not present

## 2015-07-07 DIAGNOSIS — S72001D Fracture of unspecified part of neck of right femur, subsequent encounter for closed fracture with routine healing: Secondary | ICD-10-CM | POA: Diagnosis not present

## 2015-07-13 DIAGNOSIS — Z9889 Other specified postprocedural states: Secondary | ICD-10-CM | POA: Diagnosis not present

## 2015-07-13 DIAGNOSIS — Z8781 Personal history of (healed) traumatic fracture: Secondary | ICD-10-CM | POA: Diagnosis not present

## 2015-07-13 DIAGNOSIS — M25561 Pain in right knee: Secondary | ICD-10-CM | POA: Diagnosis not present

## 2017-01-13 DIAGNOSIS — J431 Panlobular emphysema: Secondary | ICD-10-CM | POA: Diagnosis not present

## 2017-01-13 DIAGNOSIS — E538 Deficiency of other specified B group vitamins: Secondary | ICD-10-CM | POA: Diagnosis not present

## 2017-01-13 DIAGNOSIS — E782 Mixed hyperlipidemia: Secondary | ICD-10-CM | POA: Diagnosis not present

## 2017-01-13 DIAGNOSIS — I739 Peripheral vascular disease, unspecified: Secondary | ICD-10-CM | POA: Diagnosis not present

## 2017-01-13 DIAGNOSIS — Z0000 Encounter for general adult medical examination without abnormal findings: Secondary | ICD-10-CM | POA: Diagnosis not present

## 2017-01-13 DIAGNOSIS — E039 Hypothyroidism, unspecified: Secondary | ICD-10-CM | POA: Diagnosis not present

## 2017-01-16 DIAGNOSIS — I739 Peripheral vascular disease, unspecified: Secondary | ICD-10-CM | POA: Diagnosis not present

## 2017-01-16 DIAGNOSIS — E782 Mixed hyperlipidemia: Secondary | ICD-10-CM | POA: Diagnosis not present

## 2017-01-16 DIAGNOSIS — E039 Hypothyroidism, unspecified: Secondary | ICD-10-CM | POA: Diagnosis not present

## 2017-01-16 DIAGNOSIS — Z0000 Encounter for general adult medical examination without abnormal findings: Secondary | ICD-10-CM | POA: Diagnosis not present

## 2017-01-16 DIAGNOSIS — J431 Panlobular emphysema: Secondary | ICD-10-CM | POA: Diagnosis not present

## 2017-01-16 DIAGNOSIS — E538 Deficiency of other specified B group vitamins: Secondary | ICD-10-CM | POA: Diagnosis not present

## 2017-02-24 DIAGNOSIS — F419 Anxiety disorder, unspecified: Secondary | ICD-10-CM | POA: Diagnosis not present

## 2017-02-24 DIAGNOSIS — J449 Chronic obstructive pulmonary disease, unspecified: Secondary | ICD-10-CM | POA: Diagnosis not present

## 2017-02-24 DIAGNOSIS — R2681 Unsteadiness on feet: Secondary | ICD-10-CM | POA: Diagnosis not present

## 2017-02-24 DIAGNOSIS — I739 Peripheral vascular disease, unspecified: Secondary | ICD-10-CM | POA: Diagnosis not present

## 2017-02-24 DIAGNOSIS — Z9181 History of falling: Secondary | ICD-10-CM | POA: Diagnosis not present

## 2017-02-24 DIAGNOSIS — I10 Essential (primary) hypertension: Secondary | ICD-10-CM | POA: Diagnosis not present

## 2017-02-24 DIAGNOSIS — E46 Unspecified protein-calorie malnutrition: Secondary | ICD-10-CM | POA: Diagnosis not present

## 2017-02-24 DIAGNOSIS — F0390 Unspecified dementia without behavioral disturbance: Secondary | ICD-10-CM | POA: Diagnosis not present

## 2017-02-24 DIAGNOSIS — I2510 Atherosclerotic heart disease of native coronary artery without angina pectoris: Secondary | ICD-10-CM | POA: Diagnosis not present

## 2017-02-25 DIAGNOSIS — I2510 Atherosclerotic heart disease of native coronary artery without angina pectoris: Secondary | ICD-10-CM | POA: Diagnosis not present

## 2017-02-25 DIAGNOSIS — F0390 Unspecified dementia without behavioral disturbance: Secondary | ICD-10-CM | POA: Diagnosis not present

## 2017-02-25 DIAGNOSIS — R2681 Unsteadiness on feet: Secondary | ICD-10-CM | POA: Diagnosis not present

## 2017-02-25 DIAGNOSIS — I10 Essential (primary) hypertension: Secondary | ICD-10-CM | POA: Diagnosis not present

## 2017-02-25 DIAGNOSIS — J449 Chronic obstructive pulmonary disease, unspecified: Secondary | ICD-10-CM | POA: Diagnosis not present

## 2017-02-25 DIAGNOSIS — Z9181 History of falling: Secondary | ICD-10-CM | POA: Diagnosis not present

## 2017-02-25 DIAGNOSIS — F419 Anxiety disorder, unspecified: Secondary | ICD-10-CM | POA: Diagnosis not present

## 2017-02-25 DIAGNOSIS — E46 Unspecified protein-calorie malnutrition: Secondary | ICD-10-CM | POA: Diagnosis not present

## 2017-02-25 DIAGNOSIS — I739 Peripheral vascular disease, unspecified: Secondary | ICD-10-CM | POA: Diagnosis not present

## 2017-02-27 DIAGNOSIS — I2510 Atherosclerotic heart disease of native coronary artery without angina pectoris: Secondary | ICD-10-CM | POA: Diagnosis not present

## 2017-02-27 DIAGNOSIS — I10 Essential (primary) hypertension: Secondary | ICD-10-CM | POA: Diagnosis not present

## 2017-02-27 DIAGNOSIS — J449 Chronic obstructive pulmonary disease, unspecified: Secondary | ICD-10-CM | POA: Diagnosis not present

## 2017-02-27 DIAGNOSIS — F0390 Unspecified dementia without behavioral disturbance: Secondary | ICD-10-CM | POA: Diagnosis not present

## 2017-02-27 DIAGNOSIS — Z9181 History of falling: Secondary | ICD-10-CM | POA: Diagnosis not present

## 2017-02-27 DIAGNOSIS — I739 Peripheral vascular disease, unspecified: Secondary | ICD-10-CM | POA: Diagnosis not present

## 2017-02-27 DIAGNOSIS — R2681 Unsteadiness on feet: Secondary | ICD-10-CM | POA: Diagnosis not present

## 2017-02-27 DIAGNOSIS — E46 Unspecified protein-calorie malnutrition: Secondary | ICD-10-CM | POA: Diagnosis not present

## 2017-02-27 DIAGNOSIS — F419 Anxiety disorder, unspecified: Secondary | ICD-10-CM | POA: Diagnosis not present

## 2017-03-01 DIAGNOSIS — F0390 Unspecified dementia without behavioral disturbance: Secondary | ICD-10-CM | POA: Diagnosis not present

## 2017-03-01 DIAGNOSIS — R2681 Unsteadiness on feet: Secondary | ICD-10-CM | POA: Diagnosis not present

## 2017-03-01 DIAGNOSIS — J449 Chronic obstructive pulmonary disease, unspecified: Secondary | ICD-10-CM | POA: Diagnosis not present

## 2017-03-01 DIAGNOSIS — I10 Essential (primary) hypertension: Secondary | ICD-10-CM | POA: Diagnosis not present

## 2017-03-01 DIAGNOSIS — F419 Anxiety disorder, unspecified: Secondary | ICD-10-CM | POA: Diagnosis not present

## 2017-03-01 DIAGNOSIS — E46 Unspecified protein-calorie malnutrition: Secondary | ICD-10-CM | POA: Diagnosis not present

## 2017-03-01 DIAGNOSIS — Z9181 History of falling: Secondary | ICD-10-CM | POA: Diagnosis not present

## 2017-03-01 DIAGNOSIS — I2510 Atherosclerotic heart disease of native coronary artery without angina pectoris: Secondary | ICD-10-CM | POA: Diagnosis not present

## 2017-03-01 DIAGNOSIS — I739 Peripheral vascular disease, unspecified: Secondary | ICD-10-CM | POA: Diagnosis not present

## 2017-03-02 DIAGNOSIS — I10 Essential (primary) hypertension: Secondary | ICD-10-CM | POA: Diagnosis not present

## 2017-03-02 DIAGNOSIS — I739 Peripheral vascular disease, unspecified: Secondary | ICD-10-CM | POA: Diagnosis not present

## 2017-03-02 DIAGNOSIS — F0390 Unspecified dementia without behavioral disturbance: Secondary | ICD-10-CM | POA: Diagnosis not present

## 2017-03-02 DIAGNOSIS — E46 Unspecified protein-calorie malnutrition: Secondary | ICD-10-CM | POA: Diagnosis not present

## 2017-03-02 DIAGNOSIS — I2510 Atherosclerotic heart disease of native coronary artery without angina pectoris: Secondary | ICD-10-CM | POA: Diagnosis not present

## 2017-03-02 DIAGNOSIS — J449 Chronic obstructive pulmonary disease, unspecified: Secondary | ICD-10-CM | POA: Diagnosis not present

## 2017-03-02 DIAGNOSIS — Z9181 History of falling: Secondary | ICD-10-CM | POA: Diagnosis not present

## 2017-03-02 DIAGNOSIS — R2681 Unsteadiness on feet: Secondary | ICD-10-CM | POA: Diagnosis not present

## 2017-03-02 DIAGNOSIS — F419 Anxiety disorder, unspecified: Secondary | ICD-10-CM | POA: Diagnosis not present

## 2017-03-03 DIAGNOSIS — R2681 Unsteadiness on feet: Secondary | ICD-10-CM | POA: Diagnosis not present

## 2017-03-03 DIAGNOSIS — I739 Peripheral vascular disease, unspecified: Secondary | ICD-10-CM | POA: Diagnosis not present

## 2017-03-03 DIAGNOSIS — I2510 Atherosclerotic heart disease of native coronary artery without angina pectoris: Secondary | ICD-10-CM | POA: Diagnosis not present

## 2017-03-03 DIAGNOSIS — F0390 Unspecified dementia without behavioral disturbance: Secondary | ICD-10-CM | POA: Diagnosis not present

## 2017-03-03 DIAGNOSIS — I10 Essential (primary) hypertension: Secondary | ICD-10-CM | POA: Diagnosis not present

## 2017-03-03 DIAGNOSIS — Z9181 History of falling: Secondary | ICD-10-CM | POA: Diagnosis not present

## 2017-03-03 DIAGNOSIS — J449 Chronic obstructive pulmonary disease, unspecified: Secondary | ICD-10-CM | POA: Diagnosis not present

## 2017-03-03 DIAGNOSIS — E46 Unspecified protein-calorie malnutrition: Secondary | ICD-10-CM | POA: Diagnosis not present

## 2017-03-03 DIAGNOSIS — F419 Anxiety disorder, unspecified: Secondary | ICD-10-CM | POA: Diagnosis not present

## 2017-03-06 DIAGNOSIS — F419 Anxiety disorder, unspecified: Secondary | ICD-10-CM | POA: Diagnosis not present

## 2017-03-06 DIAGNOSIS — J449 Chronic obstructive pulmonary disease, unspecified: Secondary | ICD-10-CM | POA: Diagnosis not present

## 2017-03-06 DIAGNOSIS — Z9181 History of falling: Secondary | ICD-10-CM | POA: Diagnosis not present

## 2017-03-06 DIAGNOSIS — I2510 Atherosclerotic heart disease of native coronary artery without angina pectoris: Secondary | ICD-10-CM | POA: Diagnosis not present

## 2017-03-06 DIAGNOSIS — I739 Peripheral vascular disease, unspecified: Secondary | ICD-10-CM | POA: Diagnosis not present

## 2017-03-06 DIAGNOSIS — I10 Essential (primary) hypertension: Secondary | ICD-10-CM | POA: Diagnosis not present

## 2017-03-06 DIAGNOSIS — E46 Unspecified protein-calorie malnutrition: Secondary | ICD-10-CM | POA: Diagnosis not present

## 2017-03-06 DIAGNOSIS — R2681 Unsteadiness on feet: Secondary | ICD-10-CM | POA: Diagnosis not present

## 2017-03-06 DIAGNOSIS — F0390 Unspecified dementia without behavioral disturbance: Secondary | ICD-10-CM | POA: Diagnosis not present

## 2017-03-08 DIAGNOSIS — E46 Unspecified protein-calorie malnutrition: Secondary | ICD-10-CM | POA: Diagnosis not present

## 2017-03-08 DIAGNOSIS — F419 Anxiety disorder, unspecified: Secondary | ICD-10-CM | POA: Diagnosis not present

## 2017-03-08 DIAGNOSIS — R2681 Unsteadiness on feet: Secondary | ICD-10-CM | POA: Diagnosis not present

## 2017-03-08 DIAGNOSIS — F0390 Unspecified dementia without behavioral disturbance: Secondary | ICD-10-CM | POA: Diagnosis not present

## 2017-03-08 DIAGNOSIS — I2510 Atherosclerotic heart disease of native coronary artery without angina pectoris: Secondary | ICD-10-CM | POA: Diagnosis not present

## 2017-03-08 DIAGNOSIS — Z9181 History of falling: Secondary | ICD-10-CM | POA: Diagnosis not present

## 2017-03-08 DIAGNOSIS — J449 Chronic obstructive pulmonary disease, unspecified: Secondary | ICD-10-CM | POA: Diagnosis not present

## 2017-03-08 DIAGNOSIS — I10 Essential (primary) hypertension: Secondary | ICD-10-CM | POA: Diagnosis not present

## 2017-03-08 DIAGNOSIS — I739 Peripheral vascular disease, unspecified: Secondary | ICD-10-CM | POA: Diagnosis not present

## 2017-03-10 DIAGNOSIS — Z9181 History of falling: Secondary | ICD-10-CM | POA: Diagnosis not present

## 2017-03-10 DIAGNOSIS — I739 Peripheral vascular disease, unspecified: Secondary | ICD-10-CM | POA: Diagnosis not present

## 2017-03-10 DIAGNOSIS — R2681 Unsteadiness on feet: Secondary | ICD-10-CM | POA: Diagnosis not present

## 2017-03-10 DIAGNOSIS — I2510 Atherosclerotic heart disease of native coronary artery without angina pectoris: Secondary | ICD-10-CM | POA: Diagnosis not present

## 2017-03-10 DIAGNOSIS — F0390 Unspecified dementia without behavioral disturbance: Secondary | ICD-10-CM | POA: Diagnosis not present

## 2017-03-10 DIAGNOSIS — I10 Essential (primary) hypertension: Secondary | ICD-10-CM | POA: Diagnosis not present

## 2017-03-10 DIAGNOSIS — J449 Chronic obstructive pulmonary disease, unspecified: Secondary | ICD-10-CM | POA: Diagnosis not present

## 2017-03-10 DIAGNOSIS — E46 Unspecified protein-calorie malnutrition: Secondary | ICD-10-CM | POA: Diagnosis not present

## 2017-03-10 DIAGNOSIS — F419 Anxiety disorder, unspecified: Secondary | ICD-10-CM | POA: Diagnosis not present

## 2017-03-14 DIAGNOSIS — R2681 Unsteadiness on feet: Secondary | ICD-10-CM | POA: Diagnosis not present

## 2017-03-14 DIAGNOSIS — F419 Anxiety disorder, unspecified: Secondary | ICD-10-CM | POA: Diagnosis not present

## 2017-03-14 DIAGNOSIS — J449 Chronic obstructive pulmonary disease, unspecified: Secondary | ICD-10-CM | POA: Diagnosis not present

## 2017-03-14 DIAGNOSIS — F0390 Unspecified dementia without behavioral disturbance: Secondary | ICD-10-CM | POA: Diagnosis not present

## 2017-03-14 DIAGNOSIS — I10 Essential (primary) hypertension: Secondary | ICD-10-CM | POA: Diagnosis not present

## 2017-03-14 DIAGNOSIS — E46 Unspecified protein-calorie malnutrition: Secondary | ICD-10-CM | POA: Diagnosis not present

## 2017-03-14 DIAGNOSIS — I739 Peripheral vascular disease, unspecified: Secondary | ICD-10-CM | POA: Diagnosis not present

## 2017-03-14 DIAGNOSIS — I2510 Atherosclerotic heart disease of native coronary artery without angina pectoris: Secondary | ICD-10-CM | POA: Diagnosis not present

## 2017-03-14 DIAGNOSIS — Z9181 History of falling: Secondary | ICD-10-CM | POA: Diagnosis not present

## 2017-03-16 DIAGNOSIS — I10 Essential (primary) hypertension: Secondary | ICD-10-CM | POA: Diagnosis not present

## 2017-03-16 DIAGNOSIS — F0390 Unspecified dementia without behavioral disturbance: Secondary | ICD-10-CM | POA: Diagnosis not present

## 2017-03-16 DIAGNOSIS — E46 Unspecified protein-calorie malnutrition: Secondary | ICD-10-CM | POA: Diagnosis not present

## 2017-03-16 DIAGNOSIS — Z9181 History of falling: Secondary | ICD-10-CM | POA: Diagnosis not present

## 2017-03-16 DIAGNOSIS — I2510 Atherosclerotic heart disease of native coronary artery without angina pectoris: Secondary | ICD-10-CM | POA: Diagnosis not present

## 2017-03-16 DIAGNOSIS — I739 Peripheral vascular disease, unspecified: Secondary | ICD-10-CM | POA: Diagnosis not present

## 2017-03-16 DIAGNOSIS — J449 Chronic obstructive pulmonary disease, unspecified: Secondary | ICD-10-CM | POA: Diagnosis not present

## 2017-03-16 DIAGNOSIS — R2681 Unsteadiness on feet: Secondary | ICD-10-CM | POA: Diagnosis not present

## 2017-03-16 DIAGNOSIS — F419 Anxiety disorder, unspecified: Secondary | ICD-10-CM | POA: Diagnosis not present

## 2017-03-17 DIAGNOSIS — I2510 Atherosclerotic heart disease of native coronary artery without angina pectoris: Secondary | ICD-10-CM | POA: Diagnosis not present

## 2017-03-17 DIAGNOSIS — R2681 Unsteadiness on feet: Secondary | ICD-10-CM | POA: Diagnosis not present

## 2017-03-17 DIAGNOSIS — E46 Unspecified protein-calorie malnutrition: Secondary | ICD-10-CM | POA: Diagnosis not present

## 2017-03-17 DIAGNOSIS — F0390 Unspecified dementia without behavioral disturbance: Secondary | ICD-10-CM | POA: Diagnosis not present

## 2017-03-17 DIAGNOSIS — Z9181 History of falling: Secondary | ICD-10-CM | POA: Diagnosis not present

## 2017-03-17 DIAGNOSIS — I10 Essential (primary) hypertension: Secondary | ICD-10-CM | POA: Diagnosis not present

## 2017-03-17 DIAGNOSIS — J449 Chronic obstructive pulmonary disease, unspecified: Secondary | ICD-10-CM | POA: Diagnosis not present

## 2017-03-17 DIAGNOSIS — I739 Peripheral vascular disease, unspecified: Secondary | ICD-10-CM | POA: Diagnosis not present

## 2017-03-17 DIAGNOSIS — F419 Anxiety disorder, unspecified: Secondary | ICD-10-CM | POA: Diagnosis not present

## 2017-03-21 DIAGNOSIS — R2681 Unsteadiness on feet: Secondary | ICD-10-CM | POA: Diagnosis not present

## 2017-03-21 DIAGNOSIS — I2510 Atherosclerotic heart disease of native coronary artery without angina pectoris: Secondary | ICD-10-CM | POA: Diagnosis not present

## 2017-03-21 DIAGNOSIS — F419 Anxiety disorder, unspecified: Secondary | ICD-10-CM | POA: Diagnosis not present

## 2017-03-21 DIAGNOSIS — I10 Essential (primary) hypertension: Secondary | ICD-10-CM | POA: Diagnosis not present

## 2017-03-21 DIAGNOSIS — Z9181 History of falling: Secondary | ICD-10-CM | POA: Diagnosis not present

## 2017-03-21 DIAGNOSIS — I739 Peripheral vascular disease, unspecified: Secondary | ICD-10-CM | POA: Diagnosis not present

## 2017-03-21 DIAGNOSIS — J449 Chronic obstructive pulmonary disease, unspecified: Secondary | ICD-10-CM | POA: Diagnosis not present

## 2017-03-21 DIAGNOSIS — F0390 Unspecified dementia without behavioral disturbance: Secondary | ICD-10-CM | POA: Diagnosis not present

## 2017-03-21 DIAGNOSIS — E46 Unspecified protein-calorie malnutrition: Secondary | ICD-10-CM | POA: Diagnosis not present

## 2017-03-24 DIAGNOSIS — J449 Chronic obstructive pulmonary disease, unspecified: Secondary | ICD-10-CM | POA: Diagnosis not present

## 2017-03-24 DIAGNOSIS — E538 Deficiency of other specified B group vitamins: Secondary | ICD-10-CM | POA: Diagnosis not present

## 2017-03-24 DIAGNOSIS — I2510 Atherosclerotic heart disease of native coronary artery without angina pectoris: Secondary | ICD-10-CM | POA: Diagnosis not present

## 2017-03-24 DIAGNOSIS — I10 Essential (primary) hypertension: Secondary | ICD-10-CM | POA: Diagnosis not present

## 2017-03-24 DIAGNOSIS — I739 Peripheral vascular disease, unspecified: Secondary | ICD-10-CM | POA: Diagnosis not present

## 2017-03-24 DIAGNOSIS — E860 Dehydration: Secondary | ICD-10-CM | POA: Diagnosis not present

## 2017-03-24 DIAGNOSIS — F0390 Unspecified dementia without behavioral disturbance: Secondary | ICD-10-CM | POA: Diagnosis not present

## 2017-03-24 DIAGNOSIS — Z9181 History of falling: Secondary | ICD-10-CM | POA: Diagnosis not present

## 2017-03-24 DIAGNOSIS — E46 Unspecified protein-calorie malnutrition: Secondary | ICD-10-CM | POA: Diagnosis not present

## 2017-03-24 DIAGNOSIS — R2681 Unsteadiness on feet: Secondary | ICD-10-CM | POA: Diagnosis not present

## 2017-03-24 DIAGNOSIS — F419 Anxiety disorder, unspecified: Secondary | ICD-10-CM | POA: Diagnosis not present

## 2017-03-25 DIAGNOSIS — Z9181 History of falling: Secondary | ICD-10-CM | POA: Diagnosis not present

## 2017-03-25 DIAGNOSIS — R2681 Unsteadiness on feet: Secondary | ICD-10-CM | POA: Diagnosis not present

## 2017-03-25 DIAGNOSIS — F0390 Unspecified dementia without behavioral disturbance: Secondary | ICD-10-CM | POA: Diagnosis not present

## 2017-03-25 DIAGNOSIS — I2510 Atherosclerotic heart disease of native coronary artery without angina pectoris: Secondary | ICD-10-CM | POA: Diagnosis not present

## 2017-03-25 DIAGNOSIS — E46 Unspecified protein-calorie malnutrition: Secondary | ICD-10-CM | POA: Diagnosis not present

## 2017-03-25 DIAGNOSIS — F419 Anxiety disorder, unspecified: Secondary | ICD-10-CM | POA: Diagnosis not present

## 2017-03-25 DIAGNOSIS — J449 Chronic obstructive pulmonary disease, unspecified: Secondary | ICD-10-CM | POA: Diagnosis not present

## 2017-03-25 DIAGNOSIS — I10 Essential (primary) hypertension: Secondary | ICD-10-CM | POA: Diagnosis not present

## 2017-03-25 DIAGNOSIS — I739 Peripheral vascular disease, unspecified: Secondary | ICD-10-CM | POA: Diagnosis not present

## 2017-03-27 DIAGNOSIS — I739 Peripheral vascular disease, unspecified: Secondary | ICD-10-CM | POA: Diagnosis not present

## 2017-03-27 DIAGNOSIS — F419 Anxiety disorder, unspecified: Secondary | ICD-10-CM | POA: Diagnosis not present

## 2017-03-27 DIAGNOSIS — J449 Chronic obstructive pulmonary disease, unspecified: Secondary | ICD-10-CM | POA: Diagnosis not present

## 2017-03-27 DIAGNOSIS — F0390 Unspecified dementia without behavioral disturbance: Secondary | ICD-10-CM | POA: Diagnosis not present

## 2017-03-27 DIAGNOSIS — E46 Unspecified protein-calorie malnutrition: Secondary | ICD-10-CM | POA: Diagnosis not present

## 2017-03-27 DIAGNOSIS — I2510 Atherosclerotic heart disease of native coronary artery without angina pectoris: Secondary | ICD-10-CM | POA: Diagnosis not present

## 2017-03-27 DIAGNOSIS — R2681 Unsteadiness on feet: Secondary | ICD-10-CM | POA: Diagnosis not present

## 2017-03-27 DIAGNOSIS — Z9181 History of falling: Secondary | ICD-10-CM | POA: Diagnosis not present

## 2017-03-27 DIAGNOSIS — I10 Essential (primary) hypertension: Secondary | ICD-10-CM | POA: Diagnosis not present

## 2017-03-29 DIAGNOSIS — E46 Unspecified protein-calorie malnutrition: Secondary | ICD-10-CM | POA: Diagnosis not present

## 2017-03-29 DIAGNOSIS — I2510 Atherosclerotic heart disease of native coronary artery without angina pectoris: Secondary | ICD-10-CM | POA: Diagnosis not present

## 2017-03-29 DIAGNOSIS — I10 Essential (primary) hypertension: Secondary | ICD-10-CM | POA: Diagnosis not present

## 2017-03-29 DIAGNOSIS — F419 Anxiety disorder, unspecified: Secondary | ICD-10-CM | POA: Diagnosis not present

## 2017-03-29 DIAGNOSIS — F0390 Unspecified dementia without behavioral disturbance: Secondary | ICD-10-CM | POA: Diagnosis not present

## 2017-03-29 DIAGNOSIS — J449 Chronic obstructive pulmonary disease, unspecified: Secondary | ICD-10-CM | POA: Diagnosis not present

## 2017-03-29 DIAGNOSIS — I739 Peripheral vascular disease, unspecified: Secondary | ICD-10-CM | POA: Diagnosis not present

## 2017-03-29 DIAGNOSIS — Z9181 History of falling: Secondary | ICD-10-CM | POA: Diagnosis not present

## 2017-03-29 DIAGNOSIS — R2681 Unsteadiness on feet: Secondary | ICD-10-CM | POA: Diagnosis not present

## 2017-03-31 DIAGNOSIS — F0390 Unspecified dementia without behavioral disturbance: Secondary | ICD-10-CM | POA: Diagnosis not present

## 2017-03-31 DIAGNOSIS — J449 Chronic obstructive pulmonary disease, unspecified: Secondary | ICD-10-CM | POA: Diagnosis not present

## 2017-03-31 DIAGNOSIS — Z9181 History of falling: Secondary | ICD-10-CM | POA: Diagnosis not present

## 2017-03-31 DIAGNOSIS — F419 Anxiety disorder, unspecified: Secondary | ICD-10-CM | POA: Diagnosis not present

## 2017-03-31 DIAGNOSIS — I10 Essential (primary) hypertension: Secondary | ICD-10-CM | POA: Diagnosis not present

## 2017-03-31 DIAGNOSIS — E46 Unspecified protein-calorie malnutrition: Secondary | ICD-10-CM | POA: Diagnosis not present

## 2017-03-31 DIAGNOSIS — I2510 Atherosclerotic heart disease of native coronary artery without angina pectoris: Secondary | ICD-10-CM | POA: Diagnosis not present

## 2017-03-31 DIAGNOSIS — I739 Peripheral vascular disease, unspecified: Secondary | ICD-10-CM | POA: Diagnosis not present

## 2017-03-31 DIAGNOSIS — R2681 Unsteadiness on feet: Secondary | ICD-10-CM | POA: Diagnosis not present

## 2017-05-03 DIAGNOSIS — E871 Hypo-osmolality and hyponatremia: Secondary | ICD-10-CM | POA: Diagnosis not present

## 2017-05-03 DIAGNOSIS — J431 Panlobular emphysema: Secondary | ICD-10-CM | POA: Diagnosis not present

## 2017-05-03 DIAGNOSIS — N183 Chronic kidney disease, stage 3 (moderate): Secondary | ICD-10-CM | POA: Diagnosis not present

## 2017-05-03 DIAGNOSIS — H8110 Benign paroxysmal vertigo, unspecified ear: Secondary | ICD-10-CM | POA: Diagnosis not present

## 2017-05-08 DIAGNOSIS — I10 Essential (primary) hypertension: Secondary | ICD-10-CM | POA: Diagnosis not present

## 2017-05-08 DIAGNOSIS — F0390 Unspecified dementia without behavioral disturbance: Secondary | ICD-10-CM | POA: Diagnosis not present

## 2017-05-08 DIAGNOSIS — J449 Chronic obstructive pulmonary disease, unspecified: Secondary | ICD-10-CM | POA: Diagnosis not present

## 2017-05-08 DIAGNOSIS — R531 Weakness: Secondary | ICD-10-CM | POA: Diagnosis not present

## 2017-05-08 DIAGNOSIS — R296 Repeated falls: Secondary | ICD-10-CM | POA: Diagnosis not present

## 2017-05-08 DIAGNOSIS — S50902D Unspecified superficial injury of left elbow, subsequent encounter: Secondary | ICD-10-CM | POA: Diagnosis not present

## 2017-05-08 DIAGNOSIS — Z7982 Long term (current) use of aspirin: Secondary | ICD-10-CM | POA: Diagnosis not present

## 2017-05-08 DIAGNOSIS — I2510 Atherosclerotic heart disease of native coronary artery without angina pectoris: Secondary | ICD-10-CM | POA: Diagnosis not present

## 2017-05-09 DIAGNOSIS — I10 Essential (primary) hypertension: Secondary | ICD-10-CM | POA: Diagnosis not present

## 2017-05-09 DIAGNOSIS — J449 Chronic obstructive pulmonary disease, unspecified: Secondary | ICD-10-CM | POA: Diagnosis not present

## 2017-05-09 DIAGNOSIS — F0390 Unspecified dementia without behavioral disturbance: Secondary | ICD-10-CM | POA: Diagnosis not present

## 2017-05-09 DIAGNOSIS — S50902D Unspecified superficial injury of left elbow, subsequent encounter: Secondary | ICD-10-CM | POA: Diagnosis not present

## 2017-05-09 DIAGNOSIS — R531 Weakness: Secondary | ICD-10-CM | POA: Diagnosis not present

## 2017-05-09 DIAGNOSIS — I2510 Atherosclerotic heart disease of native coronary artery without angina pectoris: Secondary | ICD-10-CM | POA: Diagnosis not present

## 2017-05-09 DIAGNOSIS — R296 Repeated falls: Secondary | ICD-10-CM | POA: Diagnosis not present

## 2017-05-09 DIAGNOSIS — Z7982 Long term (current) use of aspirin: Secondary | ICD-10-CM | POA: Diagnosis not present

## 2017-05-12 DIAGNOSIS — R296 Repeated falls: Secondary | ICD-10-CM | POA: Diagnosis not present

## 2017-05-12 DIAGNOSIS — I2510 Atherosclerotic heart disease of native coronary artery without angina pectoris: Secondary | ICD-10-CM | POA: Diagnosis not present

## 2017-05-12 DIAGNOSIS — S50902D Unspecified superficial injury of left elbow, subsequent encounter: Secondary | ICD-10-CM | POA: Diagnosis not present

## 2017-05-12 DIAGNOSIS — J449 Chronic obstructive pulmonary disease, unspecified: Secondary | ICD-10-CM | POA: Diagnosis not present

## 2017-05-12 DIAGNOSIS — R531 Weakness: Secondary | ICD-10-CM | POA: Diagnosis not present

## 2017-05-12 DIAGNOSIS — I10 Essential (primary) hypertension: Secondary | ICD-10-CM | POA: Diagnosis not present

## 2017-05-12 DIAGNOSIS — F0390 Unspecified dementia without behavioral disturbance: Secondary | ICD-10-CM | POA: Diagnosis not present

## 2017-05-12 DIAGNOSIS — Z7982 Long term (current) use of aspirin: Secondary | ICD-10-CM | POA: Diagnosis not present

## 2017-05-17 DIAGNOSIS — I2510 Atherosclerotic heart disease of native coronary artery without angina pectoris: Secondary | ICD-10-CM | POA: Diagnosis not present

## 2017-05-17 DIAGNOSIS — R531 Weakness: Secondary | ICD-10-CM | POA: Diagnosis not present

## 2017-05-17 DIAGNOSIS — J449 Chronic obstructive pulmonary disease, unspecified: Secondary | ICD-10-CM | POA: Diagnosis not present

## 2017-05-17 DIAGNOSIS — R296 Repeated falls: Secondary | ICD-10-CM | POA: Diagnosis not present

## 2017-05-17 DIAGNOSIS — F0390 Unspecified dementia without behavioral disturbance: Secondary | ICD-10-CM | POA: Diagnosis not present

## 2017-05-17 DIAGNOSIS — Z7982 Long term (current) use of aspirin: Secondary | ICD-10-CM | POA: Diagnosis not present

## 2017-05-17 DIAGNOSIS — S50902D Unspecified superficial injury of left elbow, subsequent encounter: Secondary | ICD-10-CM | POA: Diagnosis not present

## 2017-05-17 DIAGNOSIS — I10 Essential (primary) hypertension: Secondary | ICD-10-CM | POA: Diagnosis not present

## 2017-05-18 DIAGNOSIS — R296 Repeated falls: Secondary | ICD-10-CM | POA: Diagnosis not present

## 2017-05-18 DIAGNOSIS — S50902D Unspecified superficial injury of left elbow, subsequent encounter: Secondary | ICD-10-CM | POA: Diagnosis not present

## 2017-05-18 DIAGNOSIS — Z7982 Long term (current) use of aspirin: Secondary | ICD-10-CM | POA: Diagnosis not present

## 2017-05-18 DIAGNOSIS — I10 Essential (primary) hypertension: Secondary | ICD-10-CM | POA: Diagnosis not present

## 2017-05-18 DIAGNOSIS — F0390 Unspecified dementia without behavioral disturbance: Secondary | ICD-10-CM | POA: Diagnosis not present

## 2017-05-18 DIAGNOSIS — R531 Weakness: Secondary | ICD-10-CM | POA: Diagnosis not present

## 2017-05-18 DIAGNOSIS — I2510 Atherosclerotic heart disease of native coronary artery without angina pectoris: Secondary | ICD-10-CM | POA: Diagnosis not present

## 2017-05-18 DIAGNOSIS — J449 Chronic obstructive pulmonary disease, unspecified: Secondary | ICD-10-CM | POA: Diagnosis not present

## 2017-05-19 DIAGNOSIS — S50902D Unspecified superficial injury of left elbow, subsequent encounter: Secondary | ICD-10-CM | POA: Diagnosis not present

## 2017-05-19 DIAGNOSIS — I10 Essential (primary) hypertension: Secondary | ICD-10-CM | POA: Diagnosis not present

## 2017-05-19 DIAGNOSIS — R531 Weakness: Secondary | ICD-10-CM | POA: Diagnosis not present

## 2017-05-19 DIAGNOSIS — Z7982 Long term (current) use of aspirin: Secondary | ICD-10-CM | POA: Diagnosis not present

## 2017-05-19 DIAGNOSIS — F0390 Unspecified dementia without behavioral disturbance: Secondary | ICD-10-CM | POA: Diagnosis not present

## 2017-05-19 DIAGNOSIS — I2510 Atherosclerotic heart disease of native coronary artery without angina pectoris: Secondary | ICD-10-CM | POA: Diagnosis not present

## 2017-05-19 DIAGNOSIS — R296 Repeated falls: Secondary | ICD-10-CM | POA: Diagnosis not present

## 2017-05-19 DIAGNOSIS — J449 Chronic obstructive pulmonary disease, unspecified: Secondary | ICD-10-CM | POA: Diagnosis not present

## 2017-05-20 DIAGNOSIS — I2510 Atherosclerotic heart disease of native coronary artery without angina pectoris: Secondary | ICD-10-CM | POA: Diagnosis not present

## 2017-05-20 DIAGNOSIS — I10 Essential (primary) hypertension: Secondary | ICD-10-CM | POA: Diagnosis not present

## 2017-05-20 DIAGNOSIS — S50902D Unspecified superficial injury of left elbow, subsequent encounter: Secondary | ICD-10-CM | POA: Diagnosis not present

## 2017-05-20 DIAGNOSIS — R296 Repeated falls: Secondary | ICD-10-CM | POA: Diagnosis not present

## 2017-05-20 DIAGNOSIS — Z7982 Long term (current) use of aspirin: Secondary | ICD-10-CM | POA: Diagnosis not present

## 2017-05-20 DIAGNOSIS — R531 Weakness: Secondary | ICD-10-CM | POA: Diagnosis not present

## 2017-05-20 DIAGNOSIS — F0390 Unspecified dementia without behavioral disturbance: Secondary | ICD-10-CM | POA: Diagnosis not present

## 2017-05-20 DIAGNOSIS — J449 Chronic obstructive pulmonary disease, unspecified: Secondary | ICD-10-CM | POA: Diagnosis not present

## 2017-05-22 DIAGNOSIS — R531 Weakness: Secondary | ICD-10-CM | POA: Diagnosis not present

## 2017-05-22 DIAGNOSIS — F0390 Unspecified dementia without behavioral disturbance: Secondary | ICD-10-CM | POA: Diagnosis not present

## 2017-05-22 DIAGNOSIS — I2510 Atherosclerotic heart disease of native coronary artery without angina pectoris: Secondary | ICD-10-CM | POA: Diagnosis not present

## 2017-05-22 DIAGNOSIS — Z7982 Long term (current) use of aspirin: Secondary | ICD-10-CM | POA: Diagnosis not present

## 2017-05-22 DIAGNOSIS — R296 Repeated falls: Secondary | ICD-10-CM | POA: Diagnosis not present

## 2017-05-22 DIAGNOSIS — I10 Essential (primary) hypertension: Secondary | ICD-10-CM | POA: Diagnosis not present

## 2017-05-22 DIAGNOSIS — J449 Chronic obstructive pulmonary disease, unspecified: Secondary | ICD-10-CM | POA: Diagnosis not present

## 2017-05-22 DIAGNOSIS — S50902D Unspecified superficial injury of left elbow, subsequent encounter: Secondary | ICD-10-CM | POA: Diagnosis not present

## 2017-05-23 DIAGNOSIS — R531 Weakness: Secondary | ICD-10-CM | POA: Diagnosis not present

## 2017-05-23 DIAGNOSIS — F0390 Unspecified dementia without behavioral disturbance: Secondary | ICD-10-CM | POA: Diagnosis not present

## 2017-05-23 DIAGNOSIS — S50902D Unspecified superficial injury of left elbow, subsequent encounter: Secondary | ICD-10-CM | POA: Diagnosis not present

## 2017-05-23 DIAGNOSIS — J449 Chronic obstructive pulmonary disease, unspecified: Secondary | ICD-10-CM | POA: Diagnosis not present

## 2017-05-23 DIAGNOSIS — I2510 Atherosclerotic heart disease of native coronary artery without angina pectoris: Secondary | ICD-10-CM | POA: Diagnosis not present

## 2017-05-23 DIAGNOSIS — R296 Repeated falls: Secondary | ICD-10-CM | POA: Diagnosis not present

## 2017-05-23 DIAGNOSIS — I10 Essential (primary) hypertension: Secondary | ICD-10-CM | POA: Diagnosis not present

## 2017-05-23 DIAGNOSIS — Z7982 Long term (current) use of aspirin: Secondary | ICD-10-CM | POA: Diagnosis not present

## 2017-05-26 DIAGNOSIS — F0390 Unspecified dementia without behavioral disturbance: Secondary | ICD-10-CM | POA: Diagnosis not present

## 2017-05-26 DIAGNOSIS — I10 Essential (primary) hypertension: Secondary | ICD-10-CM | POA: Diagnosis not present

## 2017-05-26 DIAGNOSIS — R296 Repeated falls: Secondary | ICD-10-CM | POA: Diagnosis not present

## 2017-05-26 DIAGNOSIS — R531 Weakness: Secondary | ICD-10-CM | POA: Diagnosis not present

## 2017-05-26 DIAGNOSIS — Z7982 Long term (current) use of aspirin: Secondary | ICD-10-CM | POA: Diagnosis not present

## 2017-05-26 DIAGNOSIS — I2510 Atherosclerotic heart disease of native coronary artery without angina pectoris: Secondary | ICD-10-CM | POA: Diagnosis not present

## 2017-05-26 DIAGNOSIS — S50902D Unspecified superficial injury of left elbow, subsequent encounter: Secondary | ICD-10-CM | POA: Diagnosis not present

## 2017-05-26 DIAGNOSIS — J449 Chronic obstructive pulmonary disease, unspecified: Secondary | ICD-10-CM | POA: Diagnosis not present

## 2017-05-29 DIAGNOSIS — F0390 Unspecified dementia without behavioral disturbance: Secondary | ICD-10-CM | POA: Diagnosis not present

## 2017-05-29 DIAGNOSIS — J449 Chronic obstructive pulmonary disease, unspecified: Secondary | ICD-10-CM | POA: Diagnosis not present

## 2017-05-29 DIAGNOSIS — I10 Essential (primary) hypertension: Secondary | ICD-10-CM | POA: Diagnosis not present

## 2017-05-29 DIAGNOSIS — I2510 Atherosclerotic heart disease of native coronary artery without angina pectoris: Secondary | ICD-10-CM | POA: Diagnosis not present

## 2017-05-29 DIAGNOSIS — R296 Repeated falls: Secondary | ICD-10-CM | POA: Diagnosis not present

## 2017-05-29 DIAGNOSIS — Z7982 Long term (current) use of aspirin: Secondary | ICD-10-CM | POA: Diagnosis not present

## 2017-05-29 DIAGNOSIS — R531 Weakness: Secondary | ICD-10-CM | POA: Diagnosis not present

## 2017-05-29 DIAGNOSIS — S50902D Unspecified superficial injury of left elbow, subsequent encounter: Secondary | ICD-10-CM | POA: Diagnosis not present

## 2017-06-03 DIAGNOSIS — Z7982 Long term (current) use of aspirin: Secondary | ICD-10-CM | POA: Diagnosis not present

## 2017-06-03 DIAGNOSIS — J449 Chronic obstructive pulmonary disease, unspecified: Secondary | ICD-10-CM | POA: Diagnosis not present

## 2017-06-03 DIAGNOSIS — I2510 Atherosclerotic heart disease of native coronary artery without angina pectoris: Secondary | ICD-10-CM | POA: Diagnosis not present

## 2017-06-03 DIAGNOSIS — R296 Repeated falls: Secondary | ICD-10-CM | POA: Diagnosis not present

## 2017-06-03 DIAGNOSIS — I10 Essential (primary) hypertension: Secondary | ICD-10-CM | POA: Diagnosis not present

## 2017-06-03 DIAGNOSIS — R531 Weakness: Secondary | ICD-10-CM | POA: Diagnosis not present

## 2017-06-03 DIAGNOSIS — S50902D Unspecified superficial injury of left elbow, subsequent encounter: Secondary | ICD-10-CM | POA: Diagnosis not present

## 2017-06-03 DIAGNOSIS — F0390 Unspecified dementia without behavioral disturbance: Secondary | ICD-10-CM | POA: Diagnosis not present

## 2017-06-07 DIAGNOSIS — F0390 Unspecified dementia without behavioral disturbance: Secondary | ICD-10-CM | POA: Diagnosis not present

## 2017-06-07 DIAGNOSIS — S50902D Unspecified superficial injury of left elbow, subsequent encounter: Secondary | ICD-10-CM | POA: Diagnosis not present

## 2017-06-07 DIAGNOSIS — R531 Weakness: Secondary | ICD-10-CM | POA: Diagnosis not present

## 2017-06-07 DIAGNOSIS — J449 Chronic obstructive pulmonary disease, unspecified: Secondary | ICD-10-CM | POA: Diagnosis not present

## 2017-06-07 DIAGNOSIS — I2510 Atherosclerotic heart disease of native coronary artery without angina pectoris: Secondary | ICD-10-CM | POA: Diagnosis not present

## 2017-06-07 DIAGNOSIS — I10 Essential (primary) hypertension: Secondary | ICD-10-CM | POA: Diagnosis not present

## 2017-06-07 DIAGNOSIS — R296 Repeated falls: Secondary | ICD-10-CM | POA: Diagnosis not present

## 2017-06-07 DIAGNOSIS — Z7982 Long term (current) use of aspirin: Secondary | ICD-10-CM | POA: Diagnosis not present

## 2017-06-08 DIAGNOSIS — R296 Repeated falls: Secondary | ICD-10-CM | POA: Diagnosis not present

## 2017-06-08 DIAGNOSIS — I2510 Atherosclerotic heart disease of native coronary artery without angina pectoris: Secondary | ICD-10-CM | POA: Diagnosis not present

## 2017-06-08 DIAGNOSIS — F0390 Unspecified dementia without behavioral disturbance: Secondary | ICD-10-CM | POA: Diagnosis not present

## 2017-06-08 DIAGNOSIS — R531 Weakness: Secondary | ICD-10-CM | POA: Diagnosis not present

## 2017-06-08 DIAGNOSIS — I10 Essential (primary) hypertension: Secondary | ICD-10-CM | POA: Diagnosis not present

## 2017-06-08 DIAGNOSIS — J449 Chronic obstructive pulmonary disease, unspecified: Secondary | ICD-10-CM | POA: Diagnosis not present

## 2017-06-08 DIAGNOSIS — Z7982 Long term (current) use of aspirin: Secondary | ICD-10-CM | POA: Diagnosis not present

## 2017-06-08 DIAGNOSIS — S50902D Unspecified superficial injury of left elbow, subsequent encounter: Secondary | ICD-10-CM | POA: Diagnosis not present

## 2017-06-09 DIAGNOSIS — S50902D Unspecified superficial injury of left elbow, subsequent encounter: Secondary | ICD-10-CM | POA: Diagnosis not present

## 2017-06-09 DIAGNOSIS — R531 Weakness: Secondary | ICD-10-CM | POA: Diagnosis not present

## 2017-06-09 DIAGNOSIS — I10 Essential (primary) hypertension: Secondary | ICD-10-CM | POA: Diagnosis not present

## 2017-06-09 DIAGNOSIS — R296 Repeated falls: Secondary | ICD-10-CM | POA: Diagnosis not present

## 2017-06-09 DIAGNOSIS — F0390 Unspecified dementia without behavioral disturbance: Secondary | ICD-10-CM | POA: Diagnosis not present

## 2017-06-09 DIAGNOSIS — Z7982 Long term (current) use of aspirin: Secondary | ICD-10-CM | POA: Diagnosis not present

## 2017-06-09 DIAGNOSIS — J449 Chronic obstructive pulmonary disease, unspecified: Secondary | ICD-10-CM | POA: Diagnosis not present

## 2017-06-09 DIAGNOSIS — I2510 Atherosclerotic heart disease of native coronary artery without angina pectoris: Secondary | ICD-10-CM | POA: Diagnosis not present

## 2017-06-13 DIAGNOSIS — S50902D Unspecified superficial injury of left elbow, subsequent encounter: Secondary | ICD-10-CM | POA: Diagnosis not present

## 2017-06-13 DIAGNOSIS — Z7982 Long term (current) use of aspirin: Secondary | ICD-10-CM | POA: Diagnosis not present

## 2017-06-13 DIAGNOSIS — I10 Essential (primary) hypertension: Secondary | ICD-10-CM | POA: Diagnosis not present

## 2017-06-13 DIAGNOSIS — J449 Chronic obstructive pulmonary disease, unspecified: Secondary | ICD-10-CM | POA: Diagnosis not present

## 2017-06-13 DIAGNOSIS — R531 Weakness: Secondary | ICD-10-CM | POA: Diagnosis not present

## 2017-06-13 DIAGNOSIS — F0390 Unspecified dementia without behavioral disturbance: Secondary | ICD-10-CM | POA: Diagnosis not present

## 2017-06-13 DIAGNOSIS — I2510 Atherosclerotic heart disease of native coronary artery without angina pectoris: Secondary | ICD-10-CM | POA: Diagnosis not present

## 2017-06-13 DIAGNOSIS — R296 Repeated falls: Secondary | ICD-10-CM | POA: Diagnosis not present

## 2017-06-15 DIAGNOSIS — S50902D Unspecified superficial injury of left elbow, subsequent encounter: Secondary | ICD-10-CM | POA: Diagnosis not present

## 2017-06-15 DIAGNOSIS — I2510 Atherosclerotic heart disease of native coronary artery without angina pectoris: Secondary | ICD-10-CM | POA: Diagnosis not present

## 2017-06-15 DIAGNOSIS — J449 Chronic obstructive pulmonary disease, unspecified: Secondary | ICD-10-CM | POA: Diagnosis not present

## 2017-06-15 DIAGNOSIS — R531 Weakness: Secondary | ICD-10-CM | POA: Diagnosis not present

## 2017-06-15 DIAGNOSIS — I10 Essential (primary) hypertension: Secondary | ICD-10-CM | POA: Diagnosis not present

## 2017-06-15 DIAGNOSIS — F0390 Unspecified dementia without behavioral disturbance: Secondary | ICD-10-CM | POA: Diagnosis not present

## 2017-06-15 DIAGNOSIS — Z7982 Long term (current) use of aspirin: Secondary | ICD-10-CM | POA: Diagnosis not present

## 2017-06-15 DIAGNOSIS — R296 Repeated falls: Secondary | ICD-10-CM | POA: Diagnosis not present

## 2017-07-06 DIAGNOSIS — R0602 Shortness of breath: Secondary | ICD-10-CM | POA: Diagnosis not present

## 2017-07-07 ENCOUNTER — Encounter: Payer: Self-pay | Admitting: Emergency Medicine

## 2017-07-07 ENCOUNTER — Emergency Department: Payer: Medicare HMO

## 2017-07-07 ENCOUNTER — Inpatient Hospital Stay
Admit: 2017-07-07 | Discharge: 2017-07-07 | Disposition: A | Discharge status: Home / Self Care | Payer: Medicare HMO | Attending: Internal Medicine | Admitting: Internal Medicine

## 2017-07-07 ENCOUNTER — Other Ambulatory Visit: Payer: Self-pay

## 2017-07-07 ENCOUNTER — Inpatient Hospital Stay
Admission: EM | Admit: 2017-07-07 | Discharge: 2017-07-11 | DRG: 190 | Disposition: A | Discharge status: Home Health | Payer: Medicare HMO | Attending: Internal Medicine | Admitting: Internal Medicine

## 2017-07-07 DIAGNOSIS — R195 Other fecal abnormalities: Secondary | ICD-10-CM | POA: Diagnosis not present

## 2017-07-07 DIAGNOSIS — E039 Hypothyroidism, unspecified: Secondary | ICD-10-CM | POA: Diagnosis not present

## 2017-07-07 DIAGNOSIS — R000 Tachycardia, unspecified: Secondary | ICD-10-CM | POA: Diagnosis not present

## 2017-07-07 DIAGNOSIS — I2510 Atherosclerotic heart disease of native coronary artery without angina pectoris: Secondary | ICD-10-CM | POA: Diagnosis present

## 2017-07-07 DIAGNOSIS — J9601 Acute respiratory failure with hypoxia: Secondary | ICD-10-CM | POA: Diagnosis not present

## 2017-07-07 DIAGNOSIS — R0603 Acute respiratory distress: Secondary | ICD-10-CM | POA: Diagnosis not present

## 2017-07-07 DIAGNOSIS — Z7982 Long term (current) use of aspirin: Secondary | ICD-10-CM

## 2017-07-07 DIAGNOSIS — F419 Anxiety disorder, unspecified: Secondary | ICD-10-CM | POA: Diagnosis present

## 2017-07-07 DIAGNOSIS — Z951 Presence of aortocoronary bypass graft: Secondary | ICD-10-CM

## 2017-07-07 DIAGNOSIS — K2211 Ulcer of esophagus with bleeding: Secondary | ICD-10-CM | POA: Diagnosis not present

## 2017-07-07 DIAGNOSIS — F0390 Unspecified dementia without behavioral disturbance: Secondary | ICD-10-CM | POA: Diagnosis not present

## 2017-07-07 DIAGNOSIS — F329 Major depressive disorder, single episode, unspecified: Secondary | ICD-10-CM | POA: Diagnosis not present

## 2017-07-07 DIAGNOSIS — Z79899 Other long term (current) drug therapy: Secondary | ICD-10-CM | POA: Diagnosis not present

## 2017-07-07 DIAGNOSIS — J441 Chronic obstructive pulmonary disease with (acute) exacerbation: Secondary | ICD-10-CM | POA: Diagnosis not present

## 2017-07-07 DIAGNOSIS — Z9841 Cataract extraction status, right eye: Secondary | ICD-10-CM | POA: Diagnosis not present

## 2017-07-07 DIAGNOSIS — F17210 Nicotine dependence, cigarettes, uncomplicated: Secondary | ICD-10-CM | POA: Diagnosis present

## 2017-07-07 DIAGNOSIS — K449 Diaphragmatic hernia without obstruction or gangrene: Secondary | ICD-10-CM | POA: Diagnosis not present

## 2017-07-07 DIAGNOSIS — I248 Other forms of acute ischemic heart disease: Secondary | ICD-10-CM | POA: Diagnosis present

## 2017-07-07 DIAGNOSIS — Z90710 Acquired absence of both cervix and uterus: Secondary | ICD-10-CM | POA: Diagnosis not present

## 2017-07-07 DIAGNOSIS — R0602 Shortness of breath: Secondary | ICD-10-CM | POA: Diagnosis not present

## 2017-07-07 DIAGNOSIS — R0600 Dyspnea, unspecified: Secondary | ICD-10-CM | POA: Diagnosis not present

## 2017-07-07 DIAGNOSIS — R748 Abnormal levels of other serum enzymes: Secondary | ICD-10-CM | POA: Diagnosis not present

## 2017-07-07 DIAGNOSIS — K2210 Ulcer of esophagus without bleeding: Secondary | ICD-10-CM

## 2017-07-07 DIAGNOSIS — Z9842 Cataract extraction status, left eye: Secondary | ICD-10-CM | POA: Diagnosis not present

## 2017-07-07 DIAGNOSIS — K257 Chronic gastric ulcer without hemorrhage or perforation: Secondary | ICD-10-CM | POA: Diagnosis not present

## 2017-07-07 DIAGNOSIS — IMO0001 Reserved for inherently not codable concepts without codable children: Secondary | ICD-10-CM

## 2017-07-07 DIAGNOSIS — Z95820 Peripheral vascular angioplasty status with implants and grafts: Secondary | ICD-10-CM | POA: Diagnosis not present

## 2017-07-07 DIAGNOSIS — E000 Congenital iodine-deficiency syndrome, neurological type: Secondary | ICD-10-CM

## 2017-07-07 DIAGNOSIS — J9621 Acute and chronic respiratory failure with hypoxia: Secondary | ICD-10-CM | POA: Diagnosis not present

## 2017-07-07 DIAGNOSIS — D509 Iron deficiency anemia, unspecified: Secondary | ICD-10-CM | POA: Diagnosis not present

## 2017-07-07 DIAGNOSIS — D649 Anemia, unspecified: Secondary | ICD-10-CM | POA: Diagnosis not present

## 2017-07-07 DIAGNOSIS — D500 Iron deficiency anemia secondary to blood loss (chronic): Secondary | ICD-10-CM | POA: Diagnosis not present

## 2017-07-07 DIAGNOSIS — R7989 Other specified abnormal findings of blood chemistry: Secondary | ICD-10-CM | POA: Diagnosis not present

## 2017-07-07 LAB — TROPONIN I
TROPONIN I: 0.63 ng/mL — AB (ref <0.03)
TROPONIN I: 0.63 ng/mL — AB (ref <0.03)
TROPONIN I: 0.54 ng/mL — AB (ref <0.03)
Troponin I: 0.61 ng/mL (ref <0.03)

## 2017-07-07 LAB — IRON AND TIBC
Iron: 21 ug/dL — ABNORMAL LOW (ref 28–170)
SATURATION RATIOS: 5 % — AB (ref 10.4–31.8)
TIBC: 431 ug/dL (ref 250–450)
UIBC: 410 ug/dL

## 2017-07-07 LAB — APTT: aPTT: <24 seconds — ABNORMAL LOW (ref 24–36)

## 2017-07-07 LAB — CBC
HCT: 26.1 % — ABNORMAL LOW (ref 35.0–47.0)
HEMATOCRIT: 28.1 % — AB (ref 35.0–47.0)
HEMOGLOBIN: 9 g/dL — AB (ref 12.0–16.0)
HEMOGLOBIN: 8.3 g/dL — AB (ref 12.0–16.0)
MCH: 29.6 pg (ref 26.0–34.0)
MCH: 29.8 pg (ref 26.0–34.0)
MCHC: 32.1 g/dL (ref 32.0–36.0)
MCHC: 31.9 g/dL — AB (ref 32.0–36.0)
MCV: 92.2 fL (ref 80.0–100.0)
MCV: 93.3 fL (ref 80.0–100.0)
PLATELETS: 268 10*3/uL (ref 150–440)
Platelets: 465 10*3/uL — ABNORMAL HIGH (ref 150–440)
RBC: 3.05 MIL/uL — ABNORMAL LOW (ref 3.80–5.20)
RBC: 2.8 MIL/uL — ABNORMAL LOW (ref 3.80–5.20)
RDW: 14.6 % — ABNORMAL HIGH (ref 11.5–14.5)
RDW: 14.8 % — AB (ref 11.5–14.5)
WBC: 15.1 10*3/uL — ABNORMAL HIGH (ref 3.6–11.0)
WBC: 8.2 10*3/uL (ref 3.6–11.0)

## 2017-07-07 LAB — MRSA PCR SCREENING: MRSA BY PCR: NEGATIVE

## 2017-07-07 LAB — FERRITIN: Ferritin: 16 ng/mL (ref 11–307)

## 2017-07-07 LAB — BASIC METABOLIC PANEL
ANION GAP: 13 (ref 5–15)
Anion gap: 15 (ref 5–15)
BUN: 25 mg/dL — ABNORMAL HIGH (ref 6–20)
BUN: 25 mg/dL — AB (ref 6–20)
CALCIUM: 9.5 mg/dL (ref 8.9–10.3)
CALCIUM: 9.6 mg/dL (ref 8.9–10.3)
CO2: 20 mmol/L — AB (ref 22–32)
CO2: 19 mmol/L — ABNORMAL LOW (ref 22–32)
CREATININE: 1.43 mg/dL — AB (ref 0.44–1.00)
Chloride: 106 mmol/L (ref 101–111)
Chloride: 107 mmol/L (ref 101–111)
Creatinine, Ser: 1.61 mg/dL — ABNORMAL HIGH (ref 0.44–1.00)
GFR calc Af Amer: 33 mL/min — ABNORMAL LOW (ref >60)
GFR calc non Af Amer: 28 mL/min — ABNORMAL LOW (ref >60)
GFR, EST AFRICAN AMERICAN: 38 mL/min — AB (ref >60)
GFR, EST NON AFRICAN AMERICAN: 32 mL/min — AB (ref >60)
GLUCOSE: 236 mg/dL — AB (ref 65–99)
Glucose, Bld: 184 mg/dL — ABNORMAL HIGH (ref 65–99)
POTASSIUM: 4.6 mmol/L (ref 3.5–5.1)
Potassium: 4.4 mmol/L (ref 3.5–5.1)
SODIUM: 141 mmol/L (ref 135–145)
Sodium: 139 mmol/L (ref 135–145)

## 2017-07-07 LAB — RETICULOCYTES
RBC.: 2.7 MIL/uL — ABNORMAL LOW (ref 3.80–5.20)
RETIC CT PCT: 3.3 % — AB (ref 0.4–3.1)
Retic Count, Absolute: 89.1 10*3/uL (ref 19.0–183.0)

## 2017-07-07 LAB — VITAMIN B12: VITAMIN B 12: 2627 pg/mL — AB (ref 180–914)

## 2017-07-07 LAB — INFLUENZA PANEL BY PCR (TYPE A & B)
INFLBPCR: NEGATIVE
Influenza A By PCR: NEGATIVE

## 2017-07-07 LAB — PROTIME-INR
INR: 0.97
Prothrombin Time: 12.8 seconds (ref 11.4–15.2)

## 2017-07-07 LAB — HEPARIN LEVEL (UNFRACTIONATED): Heparin Unfractionated: 0.8 IU/mL — ABNORMAL HIGH (ref 0.30–0.70)

## 2017-07-07 LAB — FOLATE: Folate: 11.6 ng/mL (ref >5.9)

## 2017-07-07 LAB — GLUCOSE, CAPILLARY: GLUCOSE-CAPILLARY: 164 mg/dL — AB (ref 65–99)

## 2017-07-07 LAB — TSH: TSH: 0.057 u[IU]/mL — ABNORMAL LOW (ref 0.350–4.500)

## 2017-07-07 MED ORDER — ALUM & MAG HYDROXIDE-SIMETH 200-200-20 MG/5ML PO SUSP
30.0000 mL | ORAL | Status: DC | PRN
  Filled 2017-07-07: qty 30

## 2017-07-07 MED ORDER — BISACODYL 10 MG RE SUPP: 10.0000 mg | Freq: Every day | RECTAL | Status: DC | PRN

## 2017-07-07 MED ORDER — SIMVASTATIN 20 MG PO TABS
20.0000 mg | ORAL_TABLET | Freq: Every day | ORAL | Status: DC
  Administered 2017-07-07 – 2017-07-10 (×4): 20 mg via ORAL
  Filled 2017-07-07 (×4): qty 1

## 2017-07-07 MED ORDER — METHYLPREDNISOLONE SODIUM SUCC 40 MG IJ SOLR: 40.0000 mg | Freq: Four times a day (QID) | INTRAMUSCULAR | Status: DC

## 2017-07-07 MED ORDER — ONDANSETRON HCL 4 MG PO TABS: 4.0000 mg | ORAL_TABLET | Freq: Four times a day (QID) | ORAL | Status: DC | PRN

## 2017-07-07 MED ORDER — METHYLPREDNISOLONE SODIUM SUCC 125 MG IJ SOLR: 125.0000 mg | Freq: Four times a day (QID) | INTRAMUSCULAR | Status: DC

## 2017-07-07 MED ORDER — METHYLPREDNISOLONE SODIUM SUCC 125 MG IJ SOLR
125.0000 mg | Freq: Once | INTRAMUSCULAR | Status: AC
  Administered 2017-07-07: 125 mg via INTRAVENOUS

## 2017-07-07 MED ORDER — HEPARIN SODIUM (PORCINE) 5000 UNIT/ML IJ SOLN
5000.0000 [IU] | Freq: Three times a day (TID) | INTRAMUSCULAR | Status: DC
  Administered 2017-07-07 – 2017-07-09 (×5): 5000 [IU] via SUBCUTANEOUS
  Filled 2017-07-07 (×5): qty 1

## 2017-07-07 MED ORDER — LEVOTHYROXINE SODIUM 100 MCG PO TABS
100.0000 ug | ORAL_TABLET | Freq: Every day | ORAL | Status: DC
  Administered 2017-07-07: 100 ug via ORAL
  Filled 2017-07-07: qty 1

## 2017-07-07 MED ORDER — IPRATROPIUM-ALBUTEROL 0.5-2.5 (3) MG/3ML IN SOLN
3.0000 mL | Freq: Four times a day (QID) | RESPIRATORY_TRACT | Status: DC
  Administered 2017-07-07 (×3): 3 mL via RESPIRATORY_TRACT
  Filled 2017-07-07 (×3): qty 3

## 2017-07-07 MED ORDER — ALBUTEROL SULFATE (2.5 MG/3ML) 0.083% IN NEBU
2.5000 mg | INHALATION_SOLUTION | RESPIRATORY_TRACT | Status: DC | PRN
  Administered 2017-07-07 – 2017-07-10 (×2): 2.5 mg via RESPIRATORY_TRACT
  Filled 2017-07-07 (×2): qty 3

## 2017-07-07 MED ORDER — ACETAMINOPHEN 650 MG RE SUPP: 650.0000 mg | Freq: Four times a day (QID) | RECTAL | Status: DC | PRN

## 2017-07-07 MED ORDER — DEXTROSE 5 % IV SOLN
500.0000 mg | INTRAVENOUS | Status: DC
  Administered 2017-07-07: 500 mg via INTRAVENOUS
  Filled 2017-07-07 (×2): qty 500

## 2017-07-07 MED ORDER — SODIUM CHLORIDE 0.9 % IV SOLN
250.0000 mL | INTRAVENOUS | Status: DC | PRN
  Administered 2017-07-09: 14:00:00 via INTRAVENOUS

## 2017-07-07 MED ORDER — DONEPEZIL HCL 5 MG PO TABS
5.0000 mg | ORAL_TABLET | Freq: Every day | ORAL | Status: DC
  Administered 2017-07-07 – 2017-07-10 (×4): 5 mg via ORAL
  Filled 2017-07-07 (×5): qty 1

## 2017-07-07 MED ORDER — HEPARIN (PORCINE) IN NACL 100-0.45 UNIT/ML-% IJ SOLN
600.0000 [IU]/h | INTRAMUSCULAR | Status: DC
  Administered 2017-07-07: 600 [IU]/h via INTRAVENOUS
  Filled 2017-07-07: qty 250

## 2017-07-07 MED ORDER — HYDROCODONE-ACETAMINOPHEN 5-325 MG PO TABS: 1.0000 | ORAL_TABLET | Freq: Four times a day (QID) | ORAL | Status: DC | PRN

## 2017-07-07 MED ORDER — METOPROLOL TARTRATE 5 MG/5ML IV SOLN
INTRAVENOUS | Status: AC
  Administered 2017-07-07: 5 mg via INTRAVENOUS
  Filled 2017-07-07: qty 5

## 2017-07-07 MED ORDER — DEXTROSE 5 % IV SOLN
1.0000 g | INTRAVENOUS | Status: DC
  Filled 2017-07-07: qty 10

## 2017-07-07 MED ORDER — SODIUM CHLORIDE 0.9% FLUSH: 3.0000 mL | INTRAVENOUS | Status: DC | PRN

## 2017-07-07 MED ORDER — MAGNESIUM SULFATE 2 GM/50ML IV SOLN
INTRAVENOUS | Status: AC
  Filled 2017-07-07: qty 50

## 2017-07-07 MED ORDER — MAGNESIUM SULFATE 2 GM/50ML IV SOLN
2.0000 g | Freq: Once | INTRAVENOUS | Status: AC
  Administered 2017-07-07: 2 g via INTRAVENOUS

## 2017-07-07 MED ORDER — LEVALBUTEROL HCL 0.63 MG/3ML IN NEBU
0.6300 mg | INHALATION_SOLUTION | Freq: Four times a day (QID) | RESPIRATORY_TRACT | Status: DC
Start: 2017-07-07 — End: 2017-07-09
  Administered 2017-07-07 – 2017-07-08 (×5): 0.63 mg via RESPIRATORY_TRACT
  Filled 2017-07-07 (×5): qty 3

## 2017-07-07 MED ORDER — HEPARIN BOLUS VIA INFUSION
2600.0000 [IU] | Freq: Once | INTRAVENOUS | Status: AC
Start: 2017-07-07 — End: 2017-07-07
  Administered 2017-07-07: 2600 [IU] via INTRAVENOUS
  Filled 2017-07-07: qty 2600

## 2017-07-07 MED ORDER — DILTIAZEM HCL ER 120 MG PO CP24
120.0000 mg | ORAL_CAPSULE | Freq: Every day | ORAL | Status: DC
  Administered 2017-07-07 – 2017-07-11 (×4): 120 mg via ORAL
  Filled 2017-07-07 (×5): qty 1

## 2017-07-07 MED ORDER — ACETAMINOPHEN 325 MG PO TABS: 650.0000 mg | ORAL_TABLET | Freq: Four times a day (QID) | ORAL | Status: DC | PRN

## 2017-07-07 MED ORDER — DOCUSATE SODIUM 100 MG PO CAPS
100.0000 mg | ORAL_CAPSULE | Freq: Two times a day (BID) | ORAL | Status: DC
  Administered 2017-07-07 – 2017-07-11 (×8): 100 mg via ORAL
  Filled 2017-07-07 (×8): qty 1

## 2017-07-07 MED ORDER — IRBESARTAN 150 MG PO TABS
75.0000 mg | ORAL_TABLET | Freq: Every day | ORAL | Status: DC
  Administered 2017-07-07 – 2017-07-11 (×4): 75 mg via ORAL
  Filled 2017-07-07 (×4): qty 1

## 2017-07-07 MED ORDER — ONDANSETRON HCL 4 MG/2ML IJ SOLN
4.0000 mg | Freq: Four times a day (QID) | INTRAMUSCULAR | Status: DC | PRN
  Filled 2017-07-07: qty 2

## 2017-07-07 MED ORDER — METHYLPREDNISOLONE SODIUM SUCC 40 MG IJ SOLR
40.0000 mg | Freq: Two times a day (BID) | INTRAMUSCULAR | Status: DC
  Administered 2017-07-07 – 2017-07-10 (×7): 40 mg via INTRAVENOUS
  Filled 2017-07-07 (×9): qty 1

## 2017-07-07 MED ORDER — BUPROPION HCL 75 MG PO TABS
75.0000 mg | ORAL_TABLET | Freq: Every day | ORAL | Status: DC
  Administered 2017-07-07 – 2017-07-11 (×4): 75 mg via ORAL
  Filled 2017-07-07 (×5): qty 1

## 2017-07-07 MED ORDER — BUDESONIDE 0.25 MG/2ML IN SUSP
0.2500 mg | Freq: Two times a day (BID) | RESPIRATORY_TRACT | Status: DC
Start: 2017-07-07 — End: 2017-07-10
  Administered 2017-07-07 – 2017-07-10 (×7): 0.25 mg via RESPIRATORY_TRACT
  Filled 2017-07-07 (×7): qty 2

## 2017-07-07 MED ORDER — SODIUM CHLORIDE 0.45 % IV SOLN
INTRAVENOUS | Status: AC
  Administered 2017-07-07: 11:00:00 via INTRAVENOUS

## 2017-07-07 MED ORDER — MAGNESIUM HYDROXIDE 400 MG/5ML PO SUSP
30.0000 mL | Freq: Every day | ORAL | Status: DC | PRN
  Filled 2017-07-07: qty 30

## 2017-07-07 MED ORDER — LORAZEPAM 2 MG/ML IJ SOLN
0.5000 mg | Freq: Once | INTRAMUSCULAR | Status: AC
  Administered 2017-07-07: 0.5 mg via INTRAVENOUS
  Filled 2017-07-07: qty 1

## 2017-07-07 MED ORDER — ASPIRIN EC 81 MG PO TBEC
81.0000 mg | DELAYED_RELEASE_TABLET | Freq: Every day | ORAL | Status: DC
  Administered 2017-07-07 – 2017-07-11 (×4): 81 mg via ORAL
  Filled 2017-07-07 (×4): qty 1

## 2017-07-07 MED ORDER — NITROGLYCERIN 0.4 MG SL SUBL: 0.4000 mg | SUBLINGUAL_TABLET | SUBLINGUAL | Status: DC | PRN

## 2017-07-07 MED ORDER — DEXTROSE 5 % IV SOLN
500.0000 mg | INTRAVENOUS | Status: DC
  Administered 2017-07-08 – 2017-07-09 (×2): 500 mg via INTRAVENOUS
  Filled 2017-07-07 (×4): qty 500

## 2017-07-07 MED ORDER — METOPROLOL TARTRATE 5 MG/5ML IV SOLN
5.0000 mg | Freq: Once | INTRAVENOUS | Status: AC
  Administered 2017-07-07: 5 mg via INTRAVENOUS

## 2017-07-07 MED ORDER — METOPROLOL TARTRATE 25 MG PO TABS
12.5000 mg | ORAL_TABLET | Freq: Two times a day (BID) | ORAL | Status: DC
  Administered 2017-07-07 – 2017-07-11 (×8): 12.5 mg via ORAL
  Filled 2017-07-07 (×8): qty 1

## 2017-07-07 MED ORDER — IPRATROPIUM-ALBUTEROL 0.5-2.5 (3) MG/3ML IN SOLN
6.0000 mL | Freq: Once | RESPIRATORY_TRACT | Status: AC
  Administered 2017-07-07: 6 mL via RESPIRATORY_TRACT

## 2017-07-07 MED ORDER — SODIUM CHLORIDE 0.9% FLUSH
3.0000 mL | Freq: Two times a day (BID) | INTRAVENOUS | Status: DC
  Administered 2017-07-07 – 2017-07-11 (×10): 3 mL via INTRAVENOUS

## 2017-07-07 NOTE — Care Management Note (Signed)
Case Management Note  Patient Details  Name: Holly Clark MRN: [id number] Date of Birth: 1930/01/22  Subjective/Objective:                  Met with patient and her son. She states that "they live together".  She has been to [facility] in the past for rehab. She has had home health PT in the past but they cannot remember their name. They thought is was [name] can not take patient at this time).  She ambulates with a cane at baseline. She has a front-wheeled walker available also. She states she is not on O2 at home.  Her PCP is Dr. Emily Filbert with [facility].  Patient complains of sore throat and somewhat irritable. She was overheard calling her daughter a Bi--- this AM per staff. She is very confused as she states the name of her home health agency is in [place]- her son seemed surprised.  She does not want to go to SNF.  Action/Plan: Home health list delivered to son for review. I would recommend home health PT, RN and social work. RNCM team to follow. PT requested.   Expected Discharge Date:  07/09/17               Expected Discharge Plan:     In-House Referral:     Discharge planning Services  CM Consult  Post Acute Care Choice:  Home Health Choice offered to:  Patient, Adult Children  DME Arranged:    DME Agency:     HH Arranged:  RN, PT, Social Work [employer] Agency:  [place]  Status of Service:  In process, will continue to follow  If discussed at Long Length of Stay Meetings, dates discussed:    Additional Comments:  Marshell Garfinkel, RN 07/07/2017, 12:38 PM

## 2017-07-07 NOTE — ED Provider Notes (Signed)
[facility] Emergency Department Provider Note   First MD Initiated Contact with Patient 07/07/17 0114     (approximate)  I have reviewed the triage vital signs and the nursing notes.   HISTORY  Chief Complaint Shortness of Breath    HPI Holly Clark is a 84 y.o. female with below list of chronic medical conditions including CAD and COPD resents to the emergency department in respiratory distress via EMS.  Patient states that she has had progressive dyspnea today with nonproductive cough despite multiple albuterol his breathing treatments performed at home.  Per EMS on their arrival patient's oxygen saturation 80% on room air and as such she was placed on a nonrebreather and given an additional breathing treatment in route to the emergency department.  Patient presents to the emergency department tachypneic tachycardic hypoxic with apparent respiratory distress patient denies any fever.  Patient denies any chest pain.   Past Medical History:  Diagnosis Date  . COPD (chronic obstructive pulmonary disease) ([place])   . Coronary artery disease   . Dementia   . Depression   . Hypothyroidism     Patient Active Problem List   Diagnosis Date Noted  . COPD exacerbation ([place]) 07/07/2017  . Hip fracture, right ([place]) 05/18/2015    Past Surgical History:  Procedure Laterality Date  . ABDOMINAL HYSTERECTOMY    . CARDIAC SURGERY    . CATARACT EXTRACTION Bilateral 2012  . CORONARY ARTERY BYPASS GRAFT    . [place]   patient unsure of which side  . HIP PINNING,CANNULATED Right 05/19/2015   Procedure: CANNULATED HIP PINNING;  Surgeon: Hessie Knows, MD;  Location: [facility] ORS;  Service: Orthopedics;  Laterality: Right;    Prior to Admission medications   Medication Sig Start Date End Date Taking? Authorizing Provider  albuterol (PROVENTIL HFA;VENTOLIN HFA) 108 (90 BASE) MCG/ACT inhaler Inhale 2 puffs into the lungs every 6 (six) hours as needed  for wheezing or shortness of breath. 05/22/15  Yes Epifanio Lesches, MD  alum & mag hydroxide-simeth (MAALOX/MYLANTA) 200-200-20 MG/5ML suspension Take 30 mLs by mouth every 4 (four) hours as needed for indigestion. 05/22/15  Yes Epifanio Lesches, MD  aspirin EC 81 MG tablet Take 81 mg by mouth daily.   Yes [provider]  budesonide (PULMICORT) 1 MG/2ML nebulizer solution Take 1 mg by nebulization 2 (two) times daily.   Yes [provider]  diltiazem (DILACOR XR) 120 MG 24 hr capsule Take 120 mg by mouth daily.   Yes [provider]  docusate sodium (COLACE) 100 MG capsule Take 1 capsule (100 mg total) by mouth 2 (two) times daily. 05/22/15  Yes Epifanio Lesches, MD  galantamine (RAZADYNE) 8 MG tablet Take 8 mg by mouth every morning.   Yes [provider]  levalbuterol (XOPENEX) 1.25 MG/3ML nebulizer solution Take 1.25 mg by nebulization every 6 (six) hours as needed for wheezing.   Yes [provider]  levothyroxine (SYNTHROID, LEVOTHROID) 100 MCG tablet Take 100 mcg by mouth daily before breakfast.   Yes [provider]  nitroGLYCERIN (NITROSTAT) 0.4 MG SL tablet Place 0.4 mg under the tongue every 5 (five) minutes as needed for chest pain.   Yes [provider]  oral electrolytes (THERMOTABS) TABS tablet Take 1 tablet by mouth.   Yes [provider]  sertraline (ZOLOFT) 50 MG tablet Take 50 mg by mouth daily.   Yes [provider]  simvastatin (ZOCOR) 20 MG tablet Take 20 mg  by mouth at bedtime.   Yes [provider]  telmisartan (MICARDIS) 40 MG tablet Take 40 mg by mouth daily.   Yes [provider]  Umeclidinium-Vilanterol (ANORO ELLIPTA) 62.5-25 MCG/INH AEPB Inhale 1 puff into the lungs daily.   Yes [provider]  amoxicillin-clavulanate (AUGMENTIN) 875-125 MG tablet Take 1 tablet by mouth 2 (two) times daily. Patient not taking: Reported on 07/07/2017 05/22/15   Epifanio Lesches, MD  azithromycin (ZITHROMAX) 500 MG tablet Take 1 tablet (500 mg total) by mouth daily. Patient not taking: Reported on 07/07/2017 05/22/15   Epifanio Lesches, MD  bisacodyl (DULCOLAX) 10 MG suppository Place 1 suppository (10 mg total) rectally daily as needed for moderate constipation. Patient not taking: Reported on 07/07/2017 05/22/15   Epifanio Lesches, MD  buPROPion [facility]) 75 MG tablet Take 75 mg by mouth daily.    [provider]  donepezil (ARICEPT) 5 MG tablet Take 5 mg by mouth at bedtime.    [provider]  enoxaparin (LOVENOX) 30 MG/0.3ML injection Inject 0.3 mLs (30 mg total) into the skin daily. Patient not taking: Reported on 07/07/2017 05/22/15   Epifanio Lesches, MD  HYDROcodone-acetaminophen (NORCO/VICODIN) 5-325 MG tablet Take 1-2 tablets by mouth every 6 (six) hours as needed for moderate pain. Patient not taking: Reported on 07/07/2017 05/22/15   Epifanio Lesches, MD  magnesium hydroxide (MILK OF MAGNESIA) 400 MG/5ML suspension Take 30 mLs by mouth daily as needed for mild constipation. Patient not taking: Reported on 07/07/2017 05/22/15   Epifanio Lesches, MD    Allergies No known drug allergies  Family History  Problem Relation Age of Onset  . Stroke Mother     Social History Social History   Tobacco Use  . Smoking status: Current Every Day Smoker    Packs/day: 0.25    Types: Cigarettes  . Smokeless tobacco: Never Used  Substance Use Topics  . Alcohol use: Yes    Comment: rare, glass of wine occasionally  . Drug use: No    Review of Systems Constitutional: No fever/chills Eyes: No visual changes. ENT: No sore throat. Cardiovascular: Denies chest pain. Respiratory: Positive for dyspnea and cough Gastrointestinal: No abdominal pain.  No nausea, no vomiting.  No diarrhea.  No constipation. Genitourinary: Negative for dysuria. Musculoskeletal: Negative for neck pain.  Negative for back pain. Integumentary:  Negative for rash. Neurological: Negative for headaches, focal weakness or numbness.   ____________________________________________   PHYSICAL EXAM:  VITAL SIGNS: ED Triage Vitals  Enc Vitals Group     BP 07/07/17 0120 135/87     Pulse Rate 07/07/17 0120 (!) 150     Resp 07/07/17 0120 (!) 30     Temp 07/07/17 0120 98.3 F (36.8 C)     Temp Source 07/07/17 0120 Oral     SpO2 07/07/17 0120 97 %     Weight 07/07/17 0121 50.8 kg (112 lb)     Height 07/07/17 0338 1.549 m (5\' 1" )     Head Circumference --      Peak Flow --      Pain Score --      Pain Loc --      Pain Edu? --      Excl. in [place]? --     Constitutional: Alert and  Apparent respiratory distress  Eyes: Conjunctivae are normal.  Head: Atraumatic. Mouth/Throat: Mucous membranes are moist.  Oropharynx non-erythematous. Neck: No stridor. Cardiovascular: Tachycardia, regular rhythm. Good peripheral circulation. Grossly normal heart sounds. Respiratory: Tachypnea, positive accessory  respiratory muscle use, speaking 3 word phrases diffuse rhonchi on auscultation Gastrointestinal: Soft and nontender. No distention.  Musculoskeletal: No lower extremity tenderness nor edema. No gross deformities of extremities. Neurologic:  Normal speech and language. No gross focal neurologic deficits are appreciated.  Skin:  Skin is warm, dry and intact. No rash noted. Psychiatric: Mood and affect are normal. Speech and behavior are normal.  ____________________________________________   LABS (all labs ordered are listed, but only abnormal results are displayed)  Labs Reviewed  BASIC METABOLIC PANEL - Abnormal; Notable for the following components:      Result Value   CO2 20 (*)    Glucose, Bld 236 (*)    BUN 25 (*)    Creatinine, Ser 1.61 (*)    GFR calc non Af Amer 28 (*)    GFR calc Af Amer 33 (*)    All other components within normal limits  CBC - Abnormal; Notable for the following components:   WBC 15.1 (*)    RBC 3.05 (*)     Hemoglobin 9.0 (*)    HCT 28.1 (*)    RDW 14.6 (*)    Platelets 465 (*)    All other components within normal limits  TROPONIN I - Abnormal; Notable for the following components:   Troponin I 0.61 (*)    All other components within normal limits  TROPONIN I - Abnormal; Notable for the following components:   Troponin I 0.63 (*)    All other components within normal limits  CBC - Abnormal; Notable for the following components:   RBC 2.80 (*)    Hemoglobin 8.3 (*)    HCT 26.1 (*)    MCHC 31.9 (*)    RDW 14.8 (*)    All other components within normal limits  BASIC METABOLIC PANEL - Abnormal; Notable for the following components:   CO2 19 (*)    Glucose, Bld 184 (*)    BUN 25 (*)    Creatinine, Ser 1.43 (*)    GFR calc non Af Amer 32 (*)    GFR calc Af Amer 38 (*)    All other components within normal limits  GLUCOSE, CAPILLARY - Abnormal; Notable for the following components:   Glucose-Capillary 164 (*)    All other components within normal limits  MRSA PCR SCREENING  BLOOD GAS, VENOUS  TROPONIN I  TROPONIN I  APTT  PROTIME-INR  HEPARIN LEVEL (UNFRACTIONATED)  INFLUENZA PANEL BY PCR (TYPE A & B)  TSH   ____________________________________________  EKG  ED ECG REPORT I, [place] N Deyana Wnuk, the attending physician, personally viewed and interpreted this ECG.   Date: 07/07/2017  EKG Time: 01:15AM  Rate: 145  Rhythm: Sinus tachycardia  Axis: Normal  Intervals: Normal  ST&T Change: None  ____________________________________________  RADIOLOGY I, [place] N Moriah Shawley, personally viewed and evaluated these images (plain radiographs) as part of my medical decision making, as well as reviewing the written report by the radiologist.  ED MD interpretation: Hyperinflated lung fields consistent with COPD  Official radiology report(s): Dg Chest Portable 1 View  Result Date: 07/07/2017 CLINICAL DATA:  Respiratory distress EXAM: PORTABLE CHEST 1 VIEW COMPARISON:  05/19/2015  FINDINGS: Postoperative changes in the mediastinum. Heart size and pulmonary vascularity are normal. Probable emphysematous changes in the upper lungs. Scattered fibrosis in the lungs. No airspace disease or consolidation. No blunting of costophrenic angles. No pneumothorax. Calcification of the aorta. IMPRESSION: Emphysematous changes in the lungs. No consolidation or edema. Aortic atherosclerosis. Electronically Signed  By: Lucienne Capers M.D.   On: 07/07/2017 01:40    ____________________________________________   PROCEDURES  Critical Care performed: CRITICAL CARE Performed by: Gregor Hams   Total critical care time: 45 minutes  Critical care time was exclusive of separately billable procedures and treating other patients.  Critical care was necessary to treat or prevent imminent or life-threatening deterioration.  Critical care was time spent personally by me on the following activities: development of treatment plan with patient and/or surrogate as well as nursing, discussions with consultants, evaluation of patient's response to treatment, examination of patient, obtaining history from patient or surrogate, ordering and performing treatments and interventions, ordering and review of laboratory studies, ordering and review of radiographic studies, pulse oximetry and re-evaluation of patient's condition.   Procedures   ____________________________________________   INITIAL IMPRESSION / ASSESSMENT AND PLAN / ED COURSE  As part of my medical decision making, I reviewed the following data within the electronic MEDICAL RECORD [id number] year old female presenting to the emergency department with acute respiratory distress.  Concern for possible COPD exacerbation versus pneumonia versus congestive heart failure.  Given respiratory distress with potential impending respiratory failure BiPAP applied shortly after arrival to the emergency department.  2 DuoNeb's were administered via BiPAP.   Patient also given IV Solu-Medrol 125 mg.  Laboratory data notable for a troponin of 0.61 without any EKG changes consistent with infarction.  Following intervention of BiPAP DuoNeb's and Solu-Medrol patient respiratory rate improved work of breathing also improved.  Patient states that she is feeling better at this time.  Patient discussed with Dr. Estanislado Pandy for hospital admission for further evaluation and management of COPD exacerbation and elevated troponin ____________________________________________  FINAL CLINICAL IMPRESSION(S) / ED DIAGNOSES  Final diagnoses:  Acute respiratory failure with hypoxia (HCC)  COPD exacerbation (HCC)  Elevated troponin   MEDICATIONS GIVEN DURING THIS VISIT:  Medications  alum & mag hydroxide-simeth (MAALOX/MYLANTA) 200-200-20 MG/5ML suspension 30 mL (not administered)  aspirin EC tablet 81 mg (not administered)  bisacodyl (DULCOLAX) suppository 10 mg (not administered)  buPROPion (WELLBUTRIN) tablet 75 mg (not administered)  diltiazem (DILACOR XR) 24 hr capsule 120 mg (not administered)  docusate sodium (COLACE) capsule 100 mg (100 mg Oral Not Given 07/07/17 0409)  donepezil (ARICEPT) tablet 5 mg (not administered)  HYDROcodone-acetaminophen (NORCO/VICODIN) 5-325 MG per tablet 1-2 tablet (not administered)  levothyroxine (SYNTHROID, LEVOTHROID) tablet 100 mcg (not administered)  magnesium hydroxide (MILK OF MAGNESIA) suspension 30 mL (not administered)  nitroGLYCERIN (NITROSTAT) SL tablet 0.4 mg (not administered)  simvastatin (ZOCOR) tablet 20 mg (not administered)  irbesartan (AVAPRO) tablet 75 mg (not administered)  sodium chloride flush (NS) 0.9 % injection 3 mL (3 mLs Intravenous Given 07/07/17 0420)  sodium chloride flush (NS) 0.9 % injection 3 mL (not administered)  0.9 %  sodium chloride infusion (not administered)  acetaminophen (TYLENOL) tablet 650 mg (not administered)    Or  acetaminophen (TYLENOL) suppository 650 mg (not administered)    ondansetron (ZOFRAN) tablet 4 mg (not administered)    Or  ondansetron (ZOFRAN) injection 4 mg (not administered)  albuterol (PROVENTIL) (2.5 MG/3ML) 0.083% nebulizer solution 2.5 mg (not administered)  azithromycin (ZITHROMAX) 500 mg in dextrose 5 % 250 mL IVPB (0 mg Intravenous Stopped 07/07/17 0519)  heparin bolus via infusion 2,600 Units (not administered)  heparin ADULT infusion 100 units/mL (25000 units/249mL sodium chloride 0.45%) (not administered)  budesonide (PULMICORT) nebulizer solution 0.25 mg (not administered)  methylPREDNISolone sodium succinate (SOLU-MEDROL) 40 mg/mL injection 40 mg (40 mg Intravenous  Given 07/07/17 0451)  ipratropium-albuterol (DUONEB) 0.5-2.5 (3) MG/3ML nebulizer solution 3 mL (3 mLs Nebulization Given 07/07/17 0457)  ipratropium-albuterol (DUONEB) 0.5-2.5 (3) MG/3ML nebulizer solution 6 mL (6 mLs Nebulization Given 07/07/17 0132)  methylPREDNISolone sodium succinate (SOLU-MEDROL) 125 mg/2 mL injection 125 mg (125 mg Intravenous Given 07/07/17 0132)  LORazepam (ATIVAN) injection 0.5 mg (0.5 mg Intravenous Given 07/07/17 0142)  magnesium sulfate IVPB 2 g 50 mL (0 g Intravenous Stopped 07/07/17 0240)     ED Discharge Orders    None       Note:  This document was prepared using Dragon voice recognition software and may include unintentional dictation errors.    Gregor Hams, MD 07/07/17 [phone]

## 2017-07-07 NOTE — Care Management (Signed)
RNCM consult received. PT evaluation requested as RN said patient was weak to bedside commode.

## 2017-07-07 NOTE — H&P (Signed)
[facility] [place] NAME: Holly Clark    MR#:  [id number]  DATE OF BIRTH:  [birth year]  DATE OF ADMISSION:  07/07/2017  PRIMARY CARE PHYSICIAN: Rusty Aus, MD   REQUESTING/REFERRING PHYSICIAN:   CHIEF COMPLAINT:   Chief Complaint  Patient presents with  . Shortness of Breath    HISTORY OF PRESENT ILLNESS: Holly Clark  is a 84 y.o. female with a known history of COPD, coronary artery disease, dementia, hypothyroidism presented to the emergency room with increased shortness of breath for the last 2 days.  Patient oxygen saturation was around 80% on room air she was put on nonrebreather mask initially by the EMS and was given breathing treatments in route to the hospital.  Patient was put on BiPAP and stabilized in the emergency room.  Patient was found to be in COPD exacerbation.  She was given IV Solu-Medrol and breathing treatments and IV antibiotics.  No complaints of any chest pain.  Hospitalist service was consulted for further care.  No fever and chills.  PAST MEDICAL HISTORY:   Past Medical History:  Diagnosis Date  . COPD (chronic obstructive pulmonary disease) ([place])   . Coronary artery disease   . Dementia   . Depression   . Hypothyroidism     PAST SURGICAL HISTORY:  Past Surgical History:  Procedure Laterality Date  . ABDOMINAL HYSTERECTOMY    . CARDIAC SURGERY    . CATARACT EXTRACTION Bilateral 2012  . CORONARY ARTERY BYPASS GRAFT    . [place]   patient unsure of which side  . HIP PINNING,CANNULATED Right 05/19/2015   Procedure: CANNULATED HIP PINNING;  Surgeon: Hessie Knows, MD;  Location: [facility] ORS;  Service: Orthopedics;  Laterality: Right;    SOCIAL HISTORY:  Social History   Tobacco Use  . Smoking status: Current Every Day Smoker    Packs/day: 0.25    Types: Cigarettes  . Smokeless tobacco: Never Used  Substance Use Topics  . Alcohol use: Yes    Comment: rare, glass of wine  occasionally    FAMILY HISTORY:  Family History  Problem Relation Age of Onset  . Stroke Mother     DRUG ALLERGIES: No Known Allergies  REVIEW OF SYSTEMS:   CONSTITUTIONAL: No fever, fatigue or weakness.  EYES: No blurred or double vision.  EARS, NOSE, AND THROAT: No tinnitus or ear pain.  RESPIRATORY: No cough, has shortness of breath, wheezing  no hemoptysis.  CARDIOVASCULAR: No chest pain, orthopnea, edema.  GASTROINTESTINAL: No nausea, vomiting, diarrhea or abdominal pain.  GENITOURINARY: No dysuria, hematuria.  ENDOCRINE: No polyuria, nocturia,  HEMATOLOGY: No anemia, easy bruising or bleeding SKIN: No rash or lesion. MUSCULOSKELETAL: No joint pain or arthritis.   NEUROLOGIC: No tingling, numbness, weakness.  PSYCHIATRY: No anxiety or depression.   MEDICATIONS AT HOME:  Prior to Admission medications   Medication Sig Start Date End Date Taking? Authorizing Provider  albuterol (PROVENTIL HFA;VENTOLIN HFA) 108 (90 BASE) MCG/ACT inhaler Inhale 2 puffs into the lungs every 6 (six) hours as needed for wheezing or shortness of breath. 05/22/15  Yes Epifanio Lesches, MD  alum & mag hydroxide-simeth (MAALOX/MYLANTA) 200-200-20 MG/5ML suspension Take 30 mLs by mouth every 4 (four) hours as needed for indigestion. 05/22/15  Yes Epifanio Lesches, MD  aspirin EC 81 MG tablet Take 81 mg by mouth daily.   Yes [provider]  budesonide (PULMICORT) 1 MG/2ML nebulizer solution Take 1 mg by nebulization  2 (two) times daily.   Yes [provider]  diltiazem (DILACOR XR) 120 MG 24 hr capsule Take 120 mg by mouth daily.   Yes [provider]  docusate sodium (COLACE) 100 MG capsule Take 1 capsule (100 mg total) by mouth 2 (two) times daily. 05/22/15  Yes Epifanio Lesches, MD  galantamine (RAZADYNE) 8 MG tablet Take 8 mg by mouth every morning.   Yes [provider]  levalbuterol (XOPENEX) 1.25 MG/3ML nebulizer solution Take 1.25 mg by  nebulization every 6 (six) hours as needed for wheezing.   Yes [provider]  levothyroxine (SYNTHROID, LEVOTHROID) 100 MCG tablet Take 100 mcg by mouth daily before breakfast.   Yes [provider]  nitroGLYCERIN (NITROSTAT) 0.4 MG SL tablet Place 0.4 mg under the tongue every 5 (five) minutes as needed for chest pain.   Yes [provider]  oral electrolytes (THERMOTABS) TABS tablet Take 1 tablet by mouth.   Yes [provider]  sertraline (ZOLOFT) 50 MG tablet Take 50 mg by mouth daily.   Yes [provider]  simvastatin (ZOCOR) 20 MG tablet Take 20 mg by mouth at bedtime.   Yes [provider]  telmisartan (MICARDIS) 40 MG tablet Take 40 mg by mouth daily.   Yes [provider]  Umeclidinium-Vilanterol (ANORO ELLIPTA) 62.5-25 MCG/INH AEPB Inhale 1 puff into the lungs daily.   Yes [provider]  amoxicillin-clavulanate (AUGMENTIN) 875-125 MG tablet Take 1 tablet by mouth 2 (two) times daily. Patient not taking: Reported on 07/07/2017 05/22/15   Epifanio Lesches, MD  azithromycin (ZITHROMAX) 500 MG tablet Take 1 tablet (500 mg total) by mouth daily. Patient not taking: Reported on 07/07/2017 05/22/15   Epifanio Lesches, MD  bisacodyl (DULCOLAX) 10 MG suppository Place 1 suppository (10 mg total) rectally daily as needed for moderate constipation. Patient not taking: Reported on 07/07/2017 05/22/15   Epifanio Lesches, MD  buPROPion [facility]) 75 MG tablet Take 75 mg by mouth daily.    [provider]  donepezil (ARICEPT) 5 MG tablet Take 5 mg by mouth at bedtime.    [provider]  enoxaparin (LOVENOX) 30 MG/0.3ML injection Inject 0.3 mLs (30 mg total) into the skin daily. Patient not taking: Reported on 07/07/2017 05/22/15   Epifanio Lesches, MD  HYDROcodone-acetaminophen (NORCO/VICODIN) 5-325 MG tablet Take 1-2 tablets by mouth every 6 (six) hours as needed for moderate pain. Patient not  taking: Reported on 07/07/2017 05/22/15   Epifanio Lesches, MD  magnesium hydroxide (MILK OF MAGNESIA) 400 MG/5ML suspension Take 30 mLs by mouth daily as needed for mild constipation. Patient not taking: Reported on 07/07/2017 05/22/15   Epifanio Lesches, MD      PHYSICAL EXAMINATION:   VITAL SIGNS: Blood pressure 91/62, pulse 96, temperature 98.3 F (36.8 C), temperature source Oral, resp. rate 16, weight 50.8 kg (112 lb), SpO2 100 %.  BIPAP : IPAP : 12 EPAP : 6 Rate : 8 FIO2 : 40% GENERAL:  84 y.o.-year-old patient lying in the bed with no acute distress.  EYES: Pupils equal, round, reactive to light and accommodation. No scleral icterus. Extraocular muscles intact.  HEENT: Head atraumatic, normocephalic. Oropharynx and nasopharynx clear.  NECK:  Supple, no jugular venous distention. No thyroid enlargement, no tenderness.  LUNGS: Decreased breath sounds bilaterally, bilateral wheezing, no rales,rhonchi or crepitations. CARDIOVASCULAR: S1, S2 normal. No murmurs, rubs, or gallops.  ABDOMEN: Soft, nontender, nondistended. Bowel sounds present. No organomegaly or mass.  EXTREMITIES: No pedal edema, cyanosis, or clubbing.  NEUROLOGIC: Cranial nerves II through XII are intact. Muscle strength 5/5 in all extremities. Sensation intact. Gait not checked.  PSYCHIATRIC: The patient is alert and oriented x 3.  SKIN: No obvious rash, lesion, or ulcer.   LABORATORY PANEL:   CBC Recent Labs  Lab 07/07/17 0116  WBC 15.1*  HGB 9.0*  HCT 28.1*  PLT 465*  MCV 92.2  MCH 29.6  MCHC 32.1  RDW 14.6*   ------------------------------------------------------------------------------------------------------------------  Chemistries  Recent Labs  Lab 07/07/17 0116  NA 139  K 4.6  CL 106  CO2 20*  GLUCOSE 236*  BUN 25*  CREATININE 1.61*  CALCIUM 9.5   ------------------------------------------------------------------------------------------------------------------ CrCl cannot be  calculated (Unknown ideal weight.). ------------------------------------------------------------------------------------------------------------------ No results for input(s): TSH, T4TOTAL, T3FREE, THYROIDAB in the last 72 hours.  Invalid input(s): FREET3   Coagulation profile No results for input(s): INR, PROTIME in the last 168 hours. ------------------------------------------------------------------------------------------------------------------- No results for input(s): DDIMER in the last 72 hours. -------------------------------------------------------------------------------------------------------------------  Cardiac Enzymes Recent Labs  Lab 07/07/17 0116  TROPONINI 0.61*   ------------------------------------------------------------------------------------------------------------------ Invalid input(s): POCBNP  ---------------------------------------------------------------------------------------------------------------  Urinalysis    Component Value Date/Time   COLORURINE YELLOW (A) 05/18/2015 1705   APPEARANCEUR CLEAR (A) 05/18/2015 1705   LABSPEC 1.011 05/18/2015 1705   PHURINE 5.0 05/18/2015 1705   GLUCOSEU NEGATIVE 05/18/2015 1705   HGBUR 1+ (A) 05/18/2015 1705   BILIRUBINUR NEGATIVE 05/18/2015 1705   KETONESUR TRACE (A) 05/18/2015 1705   PROTEINUR NEGATIVE 05/18/2015 1705   NITRITE NEGATIVE 05/18/2015 1705   LEUKOCYTESUR NEGATIVE 05/18/2015 1705     RADIOLOGY: Dg Chest Portable 1 View  Result Date: 07/07/2017 CLINICAL DATA:  Respiratory distress EXAM: PORTABLE CHEST 1 VIEW COMPARISON:  05/19/2015 FINDINGS: Postoperative changes in the mediastinum. Heart size and pulmonary vascularity are normal. Probable emphysematous changes in the upper lungs. Scattered fibrosis in the lungs. No airspace disease or consolidation. No blunting of costophrenic angles. No pneumothorax. Calcification of the aorta. IMPRESSION: Emphysematous changes in the lungs. No  consolidation or edema. Aortic atherosclerosis. Electronically Signed   By: Lucienne Capers M.D.   On: 07/07/2017 01:40    EKG: Orders placed or performed during the hospital encounter of 07/07/17  . ED EKG within 10 minutes  . ED EKG within 10 minutes    IMPRESSION AND PLAN: 84 year old elderly female patient with history of COPD, coronary artery disease, dementia, hypothyroidism presented to the emergency room with increased shortness of breath.  Admitting diagnosis 1.  Acute hypoxic respiratory failure 2.  Acute COPD exacerbation 3.  Elevated troponin 4.  Hypothyroidism 5.  Coronary artery disease Treatment plan Admit patient to stepdown unit Continue BiPAP for respiratory failure Elevated troponin could be from demand ischemia IV Solu-Medrol 125 mg every 6 hourly Breathing treatments aggressively Start patient on IV Rocephin and Zithromax antibiotic Cycle troponin Intensivist consultation Check echocardiogram  All the records are reviewed and case discussed with ED provider. Management plans discussed with the patient, family and they are in agreement.  CODE STATUS:FULL CODE Code Status History    Date Active Date Inactive Code Status Order ID Comments User Context   05/19/2015 15:34 05/22/2015 18:54 Full Code [id number]  Hessie Knows, MD Inpatient   05/18/2015 17:41 05/19/2015 15:34 Full Code [id number]  Loletha Grayer, MD ED   05/18/2015 16:58 05/18/2015 17:41 Full Code [id number]  Hessie Knows, MD ED       TOTAL CRITICAL CARE TIME TAKING CARE OF THIS PATIENT: 55 minutes.    Saundra Shelling M.D on 07/07/2017 at 3:10 AM  Between 7am to 6pm - Pager - [phone]  After 6pm go to [email or web address] - password [email or web address] [facility]  Office  [phone]  CC: Primary care physician; Rusty Aus, MD

## 2017-07-07 NOTE — Progress Notes (Signed)
[facility] [place] NAME: Holly Clark    MR#:  [id number]  DATE OF BIRTH:  1930-03-14  SUBJECTIVE:  CHIEF COMPLAINT:   Chief Complaint  Patient presents with  . Shortness of Breath   - Patient with known COPD not on home oxygen came in with hypoxic respiratory failure-remains on BiPAP this morning. But states her breathing is much improved. -Also significant drop in hemoglobin noted this admission. Patient states that she has been having dark stools at home.  REVIEW OF SYSTEMS:  Review of Systems  Constitutional: Positive for malaise/fatigue. Negative for chills and fever.  HENT: Negative for congestion, ear discharge, hearing loss, nosebleeds and sinus pain.   Eyes: Negative for blurred vision and double vision.  Respiratory: Negative for cough, shortness of breath and wheezing.   Cardiovascular: Negative for chest pain and palpitations.  Gastrointestinal: Positive for melena. Negative for abdominal pain, blood in stool, constipation, diarrhea, nausea and vomiting.  Genitourinary: Negative for dysuria.  Musculoskeletal: Positive for myalgias.  Neurological: Positive for weakness. Negative for dizziness, speech change, focal weakness, seizures and headaches.  Psychiatric/Behavioral: Negative for depression.    DRUG ALLERGIES:  No Known Allergies  VITALS:  Blood pressure 134/73, pulse (!) 123, temperature 97.9 F (36.6 C), temperature source Oral, resp. rate (!) 40, height 5\' 1"  (1.549 m), weight 43.5 kg (95 lb 14.4 oz), SpO2 99 %.  PHYSICAL EXAMINATION:  Physical Exam  GENERAL:  84 y.o.-year-old elderly patient lying in the bed with no acute distress.  EYES: Pupils equal, round, reactive to light and accommodation. No scleral icterus. Extraocular muscles intact.  HEENT: Head atraumatic, normocephalic. Oropharynx and nasopharynx clear. Remains on BiPAP NECK:  Supple, no jugular venous distention. No thyroid enlargement, no  tenderness.  LUNGS: Tight breath sounds bilaterally, with occasional scattered wheezing, no rales,rhonchi or crepitation. No use of accessory muscles of respiration.  CARDIOVASCULAR: S1, S2 normal. No murmurs, rubs, or gallops.  ABDOMEN: Soft, nontender, nondistended. Bowel sounds present. No organomegaly or mass.  EXTREMITIES: No pedal edema, cyanosis, or clubbing.  NEUROLOGIC: Cranial nerves II through XII are intact. Muscle strength 5/5 in all extremities. Sensation intact. Gait not checked. Global weakness present PSYCHIATRIC: The patient is alert and oriented x 3.  SKIN: No obvious rash, lesion, or ulcer.    LABORATORY PANEL:   CBC Recent Labs  Lab 07/07/17 0426  WBC 8.2  HGB 8.3*  HCT 26.1*  PLT 268   ------------------------------------------------------------------------------------------------------------------  Chemistries  Recent Labs  Lab 07/07/17 0426  NA 141  K 4.4  CL 107  CO2 19*  GLUCOSE 184*  BUN 25*  CREATININE 1.43*  CALCIUM 9.6   ------------------------------------------------------------------------------------------------------------------  Cardiac Enzymes Recent Labs  Lab 07/07/17 0426  TROPONINI 0.63*   ------------------------------------------------------------------------------------------------------------------  RADIOLOGY:  Dg Chest Portable 1 View  Result Date: 07/07/2017 CLINICAL DATA:  Respiratory distress EXAM: PORTABLE CHEST 1 VIEW COMPARISON:  05/19/2015 FINDINGS: Postoperative changes in the mediastinum. Heart size and pulmonary vascularity are normal. Probable emphysematous changes in the upper lungs. Scattered fibrosis in the lungs. No airspace disease or consolidation. No blunting of costophrenic angles. No pneumothorax. Calcification of the aorta. IMPRESSION: Emphysematous changes in the lungs. No consolidation or edema. Aortic atherosclerosis. Electronically Signed   By: Lucienne Capers M.D.   On: 07/07/2017 01:40    EKG:     Orders placed or performed during the hospital encounter of 07/07/17  . ED EKG within 10 minutes  . ED EKG within 10  minutes  . EKG 12-Lead  . EKG 12-Lead    ASSESSMENT AND PLAN:   84 year old female with past medical history significant for hypothyroidism, COPD not on home oxygen, mild dementia, CAD, ongoing smoking, peripheral vascular disease, depression and anxiety presents to hospital secondary to worsening shortness of breath.  1. Acute hypoxic respiratory failure-secondary to COPD exacerbation. -Currently on BiPAP, breathing is much improved so can be weaned to nasal cannula. -Appreciate pulmonary critical care consult. -Continue steroids, inhalers and nebulizers -On azithromycin.  2. Elevated troponin-could be demand ischemia from her hypoxia and also anemia. -Echocardiogram is pending. Continue heparin drip and recycle troponins -Denies any chest pain. -Patient has had history of CAD status post bypass graft surgery  3. Acute on chronic anemia-baseline hemoglobin seems to be around 11, significant drop noted, at 8.3 now. -For a few days patient states she has been having dark stools -Anemia labs have been ordered. No acute indication for transfusion at this time unless cardiac strain is worsening. -GI consult recommended if iron deficiency noted  4. CK D stage III-Baseline creatinine is around 1.4. Monitor, avoid nephrotoxins  5. Hypothyroidism-continue Synthroid  6. DVT prophylaxis-currently on heparin drip  Physical therapy consult once off of BiPAP. Lives at home with her son. Ambulates with a walker    All the records are reviewed and case discussed with Care Management/Social Workerr. Management plans discussed with the patient, family and they are in agreement.  CODE STATUS: Full code  TOTAL TIME TAKING CARE OF THIS PATIENT: 38 minutes.   POSSIBLE D/C IN 2 DAYS, DEPENDING ON CLINICAL CONDITION.   Abbigayle Toole M.D on 07/07/2017 at 12:00  PM  Between 7am to 6pm - Pager - [phone]  After 6pm go to [email or web address] - password [email or web address] [facility]  Office  [phone]  CC: Primary care physician; Rusty Aus, MD

## 2017-07-07 NOTE — Progress Notes (Signed)
ANTICOAGULATION CONSULT NOTE - Initial Consult  Pharmacy Consult for heparin Indication: chest pain/ACS  No Known Allergies  Patient Measurements: Height: 5\' 1"  (154.9 cm) Weight: 95 lb 14.4 oz (43.5 kg) IBW/kg (Calculated) : 47.8 Heparin Dosing Weight: 43.5 kg  Vital Signs: Temp: 97.6 F (36.4 C) (02/01 0338) Temp Source: Axillary (02/01 0338) BP: 143/67 (02/01 0400) Pulse Rate: 97 (02/01 0419)  Labs: Recent Labs    07/07/17 0116  HGB 9.0*  HCT 28.1*  PLT 465*  CREATININE 1.61*  TROPONINI 0.61*    Estimated Creatinine Clearance: 17.5 mL/min (A) (by C-G formula based on SCr of 1.61 mg/dL (H)).   Medical History: Past Medical History:  Diagnosis Date  . COPD (chronic obstructive pulmonary disease) ([place])   . Coronary artery disease   . Dementia   . Depression   . Hypothyroidism     Medications:  Scheduled:  . aspirin EC  81 mg Oral Daily  . buPROPion  75 mg Oral Daily  . diltiazem  120 mg Oral Daily  . docusate sodium  100 mg Oral BID  . donepezil  5 mg Oral QHS  . heparin  2,600 Units Intravenous Once  . irbesartan  75 mg Oral Daily  . levothyroxine  100 mcg Oral QAC breakfast  . methylPREDNISolone (SOLU-MEDROL) injection  125 mg Intravenous Q6H  . simvastatin  20 mg Oral QHS  . sodium chloride flush  3 mL Intravenous Q12H    Assessment: Patient admitted for SOB found to have trops of 0.61 being started on a heparin drip  Goal of Therapy:  Heparin level 0.3-0.7 units/ml Monitor platelets by anticoagulation protocol: Yes   Plan:  Will bolus heparin 2600 units IV x 1  Will start rate at 600 units/hr and will check HL @ 1200. Baseline labs ordered. Will monitor daily CBC's and adjust per HL's  Tobie Lords, PharmD, BCPS Clinical Pharmacist 07/07/2017

## 2017-07-07 NOTE — Progress Notes (Signed)
*  PRELIMINARY RESULTS* Echocardiogram 2D Echocardiogram has been performed.  Sherrie Sport 07/07/2017, 3:39 PM

## 2017-07-07 NOTE — Progress Notes (Signed)
Patient received from emergency department. Patient is alert and oriented with no complaints of pain. Patient on bipap at this time with no shortness of breath, oxygen sats 100% on 40% fi02. Family at bedside at updated. No additional questions at this time. Patient is resting in bed with no signs of acute distress at present. Will continue to monitor.

## 2017-07-07 NOTE — Progress Notes (Signed)
ANTICOAGULATION CONSULT NOTE - Follow Up Consult  Pharmacy Consult for heparin Indication: chest pain/ACS  No Known Allergies    Assessment: 84 year old female presented to the ED with worsening dyspnea over the past two days. Admitted to the ICU 2/1 with acute on chronic respiratory failure 2/2 AECOPD requiring BiPAP. Patient was found to have elevated troponin at 0.61 and started on heparin drip.  Goal of Therapy:  Heparin level 0.3-0.7 units/ml Monitor platelets by anticoagulation protocol: Yes   Plan:  2/1 HL 0.8 @ 1200 2/1 Troponin 0.61 >> 0.63  Per conversation with ICU attending physician, discontinue heparin drip and switch to subcutaneous heparin 5000 units q8h. First dose @ 1500.  Pharmacy will continue to monitor per consult.  Patient Measurements: Height: 5\' 1"  (154.9 cm) Weight: 95 lb 14.4 oz (43.5 kg) IBW/kg (Calculated) : 47.8 Heparin Dosing Weight: 43.5 kg  Vital Signs: Temp: 97.9 F (36.6 C) (02/01 0800) Temp Source: Oral (02/01 0800) BP: 133/63 (02/01 1200) Pulse Rate: 114 (02/01 1200)  Labs: Recent Labs    07/07/17 0116 07/07/17 0426 07/07/17 1155  HGB 9.0* 8.3*  --   HCT 28.1* 26.1*  --   PLT 465* 268  --   APTT  --  <24*  --   LABPROT  --  12.8  --   INR  --  0.97  --   HEPARINUNFRC  --   --  0.80*  CREATININE 1.61* 1.43*  --   TROPONINI 0.61* 0.63*  --    Medications:  Medications Prior to Admission  Medication Sig Dispense Refill Last Dose  . albuterol (PROVENTIL HFA;VENTOLIN HFA) 108 (90 BASE) MCG/ACT inhaler Inhale 2 puffs into the lungs every 6 (six) hours as needed for wheezing or shortness of breath. 1 Inhaler 2 prn at prn  . alum & mag hydroxide-simeth (MAALOX/MYLANTA) 200-200-20 MG/5ML suspension Take 30 mLs by mouth every 4 (four) hours as needed for indigestion. 355 mL 0 prn at prn  . aspirin EC 81 MG tablet Take 81 mg by mouth daily.   07/06/2017 at 0900  . budesonide (PULMICORT) 1 MG/2ML nebulizer solution Take 1 mg by  nebulization 2 (two) times daily.   07/06/2017 at 2100  . diltiazem (DILACOR XR) 120 MG 24 hr capsule Take 120 mg by mouth daily.   07/06/2017 at 0900  . docusate sodium (COLACE) 100 MG capsule Take 1 capsule (100 mg total) by mouth 2 (two) times daily. 10 capsule 0 07/06/2017 at 2100  . galantamine (RAZADYNE) 8 MG tablet Take 8 mg by mouth every morning.   07/06/2017 at 0900  . levalbuterol (XOPENEX) 1.25 MG/3ML nebulizer solution Take 1.25 mg by nebulization every 6 (six) hours as needed for wheezing.   PRN at prn  . levothyroxine (SYNTHROID, LEVOTHROID) 100 MCG tablet Take 100 mcg by mouth daily before breakfast.   07/06/2017 at 0900  . nitroGLYCERIN (NITROSTAT) 0.4 MG SL tablet Place 0.4 mg under the tongue every 5 (five) minutes as needed for chest pain.   prn at prn  . oral electrolytes (THERMOTABS) TABS tablet Take 1 tablet by mouth.     . sertraline (ZOLOFT) 50 MG tablet Take 50 mg by mouth daily.   07/06/2017 at 2100  . simvastatin (ZOCOR) 20 MG tablet Take 20 mg by mouth at bedtime.   07/06/2017 at 2100  . telmisartan (MICARDIS) 40 MG tablet Take 40 mg by mouth daily.   07/06/2017 at 0900  . Umeclidinium-Vilanterol (ANORO ELLIPTA) 62.5-25 MCG/INH AEPB Inhale 1  puff into the lungs daily.   07/06/2017 at 0900  . amoxicillin-clavulanate (AUGMENTIN) 875-125 MG tablet Take 1 tablet by mouth 2 (two) times daily. (Patient not taking: Reported on 07/07/2017) 10 tablet 0 Completed Course at Unknown time  . azithromycin (ZITHROMAX) 500 MG tablet Take 1 tablet (500 mg total) by mouth daily. (Patient not taking: Reported on 07/07/2017) 5 tablet 0 Not Taking at Unknown time  . bisacodyl (DULCOLAX) 10 MG suppository Place 1 suppository (10 mg total) rectally daily as needed for moderate constipation. (Patient not taking: Reported on 07/07/2017) 12 suppository 0 Not Taking at Unknown time  . buPROPion (WELLBUTRIN) 75 MG tablet Take 75 mg by mouth daily.   Not Taking at Unknown time  . donepezil (ARICEPT) 5 MG tablet  Take 5 mg by mouth at bedtime.   Not Taking at Unknown time  . enoxaparin (LOVENOX) 30 MG/0.3ML injection Inject 0.3 mLs (30 mg total) into the skin daily. (Patient not taking: Reported on 07/07/2017) 14 Syringe 0 Not Taking at Unknown time  . HYDROcodone-acetaminophen (NORCO/VICODIN) 5-325 MG tablet Take 1-2 tablets by mouth every 6 (six) hours as needed for moderate pain. (Patient not taking: Reported on 07/07/2017) 30 tablet 0 Not Taking at Unknown time  . magnesium hydroxide (MILK OF MAGNESIA) 400 MG/5ML suspension Take 30 mLs by mouth daily as needed for mild constipation. (Patient not taking: Reported on 07/07/2017) 360 mL 0 Not Taking at Unknown time    Estimated Creatinine Clearance: 19.8 mL/min (A) (by C-G formula based on SCr of 1.43 mg/dL (H)).    [place] Nase  Pharmacy Student 07/07/2017,1:00 PM

## 2017-07-07 NOTE — ED Triage Notes (Signed)
Pt arrived via EMS from home where EMS reports pt has had SOB x1 day, unrelieved by at home breathing treatments. Pts O2 on arrival 80% RA, pt placed on non-re breather by EMS as well as given breathing treatment in route. Pt has labored breathing, unable to speak in complete sentences due to difficulty breathing. MD and respiratory at bedside. BIPAP initiated.

## 2017-07-07 NOTE — Consult Note (Signed)
Name: Holly Clark MRN: [id number] DOB: 02-20-1930    ADMISSION DATE:  07/07/2017 CONSULTATION DATE: 07/07/2017  REFERRING MD : Dr. Estanislado Pandy  CHIEF COMPLAINT: Shortness of Breath   BRIEF PATIENT DESCRIPTION: 84 yo female admitted with acute on chronic hypoxic respiratory failure secondary to AECOPD requiring Bipap and elevated troponin's secondary to demand ischemia vs. NSTEMI   SIGNIFICANT EVENTS  02/1-Pt admitted to stepdown unit   STUDIES:  None   HISTORY OF PRESENT ILLNESS:   This is a 84 yo female with a PMH of Hypothyroidism, Arrhythmias, Current Smoker, PVD, Depression, Dementia, CAD, and COPD.  She presented to [facility] ER via EMS from home 02/1 with c/o shortness of breath onset of symptoms 2 days prior to presentation.  Upon EMS arrival the pts O2 sats were 80% on RA despite the pt taking breathing treatments, therefore she was placed on NRB and received breathing treatment en route to ER.  Upon arrival to the ER she was transitioned to [place].  CXR revealed chronic emphysematous changes but no sign of pneumonia and initial troponin 0.61.  She received iv solumedrol, iv abx, duoneb, and iv magnesium.  She was subsequently admitted to the stepdown unit by hospitalist team for further workup and treatment.  PAST MEDICAL HISTORY :   has a past medical history of COPD (chronic obstructive pulmonary disease) ([place]), Coronary artery disease, Dementia, Depression, and Hypothyroidism.  has a past surgical history that includes Cardiac surgery; Abdominal hysterectomy; Coronary artery bypass graft; Femoral artery stent (1985); Cataract extraction (Bilateral, 2012); and Hip pinning, cannulated (Right, 05/19/2015). Prior to Admission medications   Medication Sig Start Date End Date Taking? Authorizing Provider  albuterol (PROVENTIL HFA;VENTOLIN HFA) 108 (90 BASE) MCG/ACT inhaler Inhale 2 puffs into the lungs every 6 (six) hours as needed for wheezing or shortness of breath. 05/22/15  Yes  Epifanio Lesches, MD  alum & mag hydroxide-simeth (MAALOX/MYLANTA) 200-200-20 MG/5ML suspension Take 30 mLs by mouth every 4 (four) hours as needed for indigestion. 05/22/15  Yes Epifanio Lesches, MD  aspirin EC 81 MG tablet Take 81 mg by mouth daily.   Yes [provider]  budesonide (PULMICORT) 1 MG/2ML nebulizer solution Take 1 mg by nebulization 2 (two) times daily.   Yes [provider]  diltiazem (DILACOR XR) 120 MG 24 hr capsule Take 120 mg by mouth daily.   Yes [provider]  docusate sodium (COLACE) 100 MG capsule Take 1 capsule (100 mg total) by mouth 2 (two) times daily. 05/22/15  Yes Epifanio Lesches, MD  galantamine (RAZADYNE) 8 MG tablet Take 8 mg by mouth every morning.   Yes [provider]  levalbuterol (XOPENEX) 1.25 MG/3ML nebulizer solution Take 1.25 mg by nebulization every 6 (six) hours as needed for wheezing.   Yes [provider]  levothyroxine (SYNTHROID, LEVOTHROID) 100 MCG tablet Take 100 mcg by mouth daily before breakfast.   Yes [provider]  nitroGLYCERIN (NITROSTAT) 0.4 MG SL tablet Place 0.4 mg under the tongue every 5 (five) minutes as needed for chest pain.   Yes [provider]  oral electrolytes (THERMOTABS) TABS tablet Take 1 tablet by mouth.   Yes [provider]  sertraline (ZOLOFT) 50 MG tablet Take 50 mg by mouth daily.   Yes [provider]  simvastatin (ZOCOR) 20 MG tablet Take 20 mg by mouth at bedtime.   Yes [provider]  telmisartan (MICARDIS) 40 MG tablet Take 40 mg by mouth daily.   Yes [provider]  Umeclidinium-Vilanterol (ANORO ELLIPTA) 62.5-25 MCG/INH AEPB Inhale 1 puff into the lungs daily.   Yes [provider]  amoxicillin-clavulanate (AUGMENTIN) 875-125 MG tablet Take 1 tablet by mouth 2 (two) times daily. Patient not taking: Reported on 07/07/2017 05/22/15   Epifanio Lesches, MD  azithromycin (ZITHROMAX) 500 MG  tablet Take 1 tablet (500 mg total) by mouth daily. Patient not taking: Reported on 07/07/2017 05/22/15   Epifanio Lesches, MD  bisacodyl (DULCOLAX) 10 MG suppository Place 1 suppository (10 mg total) rectally daily as needed for moderate constipation. Patient not taking: Reported on 07/07/2017 05/22/15   Epifanio Lesches, MD  buPROPion [facility]) 75 MG tablet Take 75 mg by mouth daily.    [provider]  donepezil (ARICEPT) 5 MG tablet Take 5 mg by mouth at bedtime.    [provider]  enoxaparin (LOVENOX) 30 MG/0.3ML injection Inject 0.3 mLs (30 mg total) into the skin daily. Patient not taking: Reported on 07/07/2017 05/22/15   Epifanio Lesches, MD  HYDROcodone-acetaminophen (NORCO/VICODIN) 5-325 MG tablet Take 1-2 tablets by mouth every 6 (six) hours as needed for moderate pain. Patient not taking: Reported on 07/07/2017 05/22/15   Epifanio Lesches, MD  magnesium hydroxide (MILK OF MAGNESIA) 400 MG/5ML suspension Take 30 mLs by mouth daily as needed for mild constipation. Patient not taking: Reported on 07/07/2017 05/22/15   Epifanio Lesches, MD   No Known Allergies  FAMILY HISTORY:  family history includes Stroke in her mother. SOCIAL HISTORY:  reports that she has been smoking cigarettes.  She has been smoking about 0.25 packs per day. she has never used smokeless tobacco. She reports that she drinks alcohol. She reports that she does not use drugs.  REVIEW OF SYSTEMS: Positives in BOLD  Constitutional: Negative for fever, chills, weight loss, malaise/fatigue and diaphoresis.  HENT: Negative for hearing loss, ear pain, nosebleeds, congestion, sore throat, neck pain, tinnitus and ear discharge.   Eyes: Negative for blurred vision, double vision, photophobia, pain, discharge and redness.  Respiratory: cough, hemoptysis, sputum production, shortness of breath, wheezing and stridor.   Cardiovascular: Negative for chest pain, palpitations, orthopnea,  claudication, leg swelling and PND.  Gastrointestinal: Negative for heartburn, nausea, vomiting, abdominal pain, diarrhea, constipation, blood in stool and melena.  Genitourinary: Negative for dysuria, urgency, frequency, hematuria and flank pain.  Musculoskeletal: Negative for myalgias, back pain, joint pain and falls.  Skin: Negative for itching and rash.  Neurological: Negative for dizziness, tingling, tremors, sensory change, speech change, focal weakness, seizures, loss of consciousness, weakness and headaches.  Endo/Heme/Allergies: Negative for environmental allergies and polydipsia. Does not bruise/bleed easily.  SUBJECTIVE:  Pt states her shortness of breath has improved since she was placed on Bipap  VITAL SIGNS: Temp:  [97.6 F (36.4 C)-98.3 F (36.8 C)] 97.6 F (36.4 C) (02/01 0338) Pulse Rate:  [96-150] 112 (02/01 0338) Resp:  [16-35] 22 (02/01 0338) BP: (91-135)/(62-107) 91/62 (02/01 0300) SpO2:  [97 %-100 %] 100 % (02/01 0338) FiO2 (%):  [40 %] 40 % (02/01 0338) Weight:  [43.5 kg (95 lb 14.4 oz)-50.8 kg (112 lb)] 43.5 kg (95 lb 14.4 oz) (02/01 0338)  PHYSICAL EXAMINATION: General: elderly female resting in bed, NAD on Bipap  Neuro: alert confused to time, follows commands  HEENT: supple, no JVD  Cardiovascular: sinus tach, S2G3, systolic murmur present, no R/G Lungs: diminished throughout, even, non labored  Abdomen: +BS x4, soft, non tender non distended  Musculoskeletal: normal bulk and tone, no edema  Skin: chronic stasis changed bilateral lower extremities,  scattered ecchymosis   Recent Labs  Lab 07/07/17 0116  NA 139  K 4.6  CL 106  CO2 20*  BUN 25*  CREATININE 1.61*  GLUCOSE 236*   Recent Labs  Lab 07/07/17 0116  HGB 9.0*  HCT 28.1*  WBC 15.1*  PLT 465*   Dg Chest Portable 1 View  Result Date: 07/07/2017 CLINICAL DATA:  Respiratory distress EXAM: PORTABLE CHEST 1 VIEW COMPARISON:  05/19/2015 FINDINGS: Postoperative changes in the mediastinum.  Heart size and pulmonary vascularity are normal. Probable emphysematous changes in the upper lungs. Scattered fibrosis in the lungs. No airspace disease or consolidation. No blunting of costophrenic angles. No pneumothorax. Calcification of the aorta. IMPRESSION: Emphysematous changes in the lungs. No consolidation or edema. Aortic atherosclerosis. Electronically Signed   By: Lucienne Capers M.D.   On: 07/07/2017 01:40    ASSESSMENT / PLAN: Acute on chronic hypoxic respiratory failure secondary to AECOPD  Elevated troponin's secondary to demand ischemia vs. NSTEMI  Acute renal failure Anemia without obvious acute blood loss  Hx: Hypothyroidism, Dementia, PVD, Arrhythmias, CAD, Current Smoker, and Depression  P: Prn Bipap for dyspnea and/or hypoxia  Continue scheduled and prn bronchodilator therapy Will add nebulized steroids and continue iv steroids will decrease dose Trend WBC and monitor fever curve Continue azithromycin Will rule out influenza Echo pending Continue heparin gtt for now if troponin's trend up will consult Cardiology  Trend CBC and monitor for s/sx of bleeding  Continue outpatient cardiac medications Stat EKG TSH pending  Trend BMP  Replace electrolytes as indicated  Monitor UOP Continue synthroid Provide smoking cessation counseling once off [place], [place] Pager [phone] (please enter 7 digits) PCCM Consult Pager [phone] (please enter 7 digits)

## 2017-07-07 NOTE — Progress Notes (Signed)
[place] Progress Note Patient Name: Holly Clark DOB: 09/18/1929 MRN: [id number]   Date of Service  07/07/2017  HPI/Events of Note  47 F with know h/o of COPD admitted following 2 day h/o of worsening SOB with sats in the 80s.  On NRB then placed on BiPAP with improvement in clinical condition.  Trop was positive at 0.61 but suspect demand ischemia.  Currently the patient is tachy at 102 with BP of 1556/77 (94).  Her sats are 100% with RR of 22.  On camera check the patient is on BiPAP resting comfortably.  eICU Interventions  Plan of care per primary admitting team including steroids/BiPAP/nebs/ABX To be seen by PCCM at beside Cycle trop Continue to monitor via [facility]     Intervention Category Evaluation Type: New Patient Evaluation  Holly Clark 07/07/2017, 3:54 AM

## 2017-07-07 NOTE — Plan of Care (Signed)
  Progressing Education: Knowledge of General Education information will improve 07/07/2017 0430 - Progressing by Morene Crocker, RN Health Behavior/Discharge Planning: Ability to manage health-related needs will improve 07/07/2017 0430 - Progressing by Morene Crocker, RN Clinical Measurements: Ability to maintain clinical measurements within normal limits will improve 07/07/2017 0430 - Progressing by Morene Crocker, RN Will remain free from infection 07/07/2017 0430 - Progressing by Morene Crocker, RN Diagnostic test results will improve 07/07/2017 0430 - Progressing by Morene Crocker, RN Respiratory complications will improve 07/07/2017 0430 - Progressing by Morene Crocker, RN Cardiovascular complication will be avoided 07/07/2017 0430 - Progressing by Morene Crocker, RN Activity: Risk for activity intolerance will decrease 07/07/2017 0430 - Progressing by Morene Crocker, RN Nutrition: Adequate nutrition will be maintained 07/07/2017 0430 - Progressing by Morene Crocker, RN Coping: Level of anxiety will decrease 07/07/2017 0430 - Progressing by Morene Crocker, RN Elimination: Will not experience complications related to bowel motility 07/07/2017 0430 - Progressing by Morene Crocker, RN Will not experience complications related to urinary retention 07/07/2017 0430 - Progressing by Morene Crocker, RN Pain Managment: General experience of comfort will improve 07/07/2017 0430 - Progressing by Morene Crocker, RN Safety: Ability to remain free from injury will improve 07/07/2017 0430 - Progressing by Morene Crocker, RN Skin Integrity: Risk for impaired skin integrity will decrease 07/07/2017 0430 - Progressing by Morene Crocker, RN

## 2017-07-08 DIAGNOSIS — J441 Chronic obstructive pulmonary disease with (acute) exacerbation: Principal | ICD-10-CM

## 2017-07-08 DIAGNOSIS — D649 Anemia, unspecified: Secondary | ICD-10-CM

## 2017-07-08 DIAGNOSIS — D500 Iron deficiency anemia secondary to blood loss (chronic): Secondary | ICD-10-CM

## 2017-07-08 DIAGNOSIS — J9601 Acute respiratory failure with hypoxia: Secondary | ICD-10-CM

## 2017-07-08 DIAGNOSIS — R195 Other fecal abnormalities: Secondary | ICD-10-CM

## 2017-07-08 LAB — CBC
HCT: 22.2 % — ABNORMAL LOW (ref 35.0–47.0)
HEMOGLOBIN: 7.4 g/dL — AB (ref 12.0–16.0)
MCH: 29.8 pg (ref 26.0–34.0)
MCHC: 33.3 g/dL (ref 32.0–36.0)
MCV: 89.7 fL (ref 80.0–100.0)
Platelets: 281 10*3/uL (ref 150–440)
RBC: 2.48 MIL/uL — ABNORMAL LOW (ref 3.80–5.20)
RDW: 14.2 % (ref 11.5–14.5)
WBC: 18.1 10*3/uL — AB (ref 3.6–11.0)

## 2017-07-08 LAB — BASIC METABOLIC PANEL
Anion gap: 8 (ref 5–15)
BUN: 33 mg/dL — ABNORMAL HIGH (ref 6–20)
CALCIUM: 8.9 mg/dL (ref 8.9–10.3)
CO2: 22 mmol/L (ref 22–32)
CREATININE: 1.31 mg/dL — AB (ref 0.44–1.00)
Chloride: 107 mmol/L (ref 101–111)
GFR calc non Af Amer: 36 mL/min — ABNORMAL LOW (ref >60)
GFR, EST AFRICAN AMERICAN: 42 mL/min — AB (ref >60)
Glucose, Bld: 130 mg/dL — ABNORMAL HIGH (ref 65–99)
Potassium: 4.5 mmol/L (ref 3.5–5.1)
SODIUM: 137 mmol/L (ref 135–145)

## 2017-07-08 LAB — ECHOCARDIOGRAM COMPLETE
HEIGHTINCHES: 61 in
Weight: 1534.4 oz

## 2017-07-08 LAB — HEMOGLOBIN AND HEMATOCRIT, BLOOD
HCT: 25.7 % — ABNORMAL LOW (ref 35.0–47.0)
Hemoglobin: 8.3 g/dL — ABNORMAL LOW (ref 12.0–16.0)

## 2017-07-08 MED ORDER — SODIUM CHLORIDE 0.9 % IV SOLN
510.0000 mg | Freq: Once | INTRAVENOUS | Status: AC
  Administered 2017-07-08: 510 mg via INTRAVENOUS
  Filled 2017-07-08: qty 17

## 2017-07-08 MED ORDER — PHENOL 1.4 % MT LIQD
1.0000 | OROMUCOSAL | Status: DC | PRN
  Filled 2017-07-08: qty 177

## 2017-07-08 MED ORDER — LEVOTHYROXINE SODIUM 50 MCG PO TABS
50.0000 ug | ORAL_TABLET | Freq: Every day | ORAL | Status: DC
  Administered 2017-07-08 – 2017-07-10 (×2): 50 ug via ORAL
  Filled 2017-07-08 (×2): qty 1

## 2017-07-08 MED ORDER — PANTOPRAZOLE SODIUM 40 MG IV SOLR
40.0000 mg | Freq: Two times a day (BID) | INTRAVENOUS | Status: DC
  Administered 2017-07-08 – 2017-07-09 (×3): 40 mg via INTRAVENOUS
  Filled 2017-07-08 (×3): qty 40

## 2017-07-08 NOTE — Progress Notes (Signed)
Name: Holly Clark MRN: [id number] DOB: [birth year]    ADMISSION DATE:  07/07/2017 CONSULTATION DATE: 07/07/2017  REFERRING MD : Dr. Estanislado Pandy  CHIEF COMPLAINT: Shortness of Breath   BRIEF PATIENT DESCRIPTION: 84 yo female admitted with acute on chronic hypoxic respiratory failure secondary to AECOPD requiring Bipap and elevated troponin's secondary to demand ischemia vs. NSTEMI   SIGNIFICANT EVENTS  02/1-Pt admitted to stepdown unit  02/02 Transfer out of ICU  MICRO Influenza negative  STUDIES:  None   HISTORY OF PRESENT ILLNESS:   This is a 84 yo female with a PMH of Hypothyroidism, Arrhythmias, Current Smoker, PVD, Depression, Dementia, CAD, and COPD.  She presented to [facility] ER via EMS from home 02/1 with c/o shortness of breath onset of symptoms 2 days prior to presentation.  Upon EMS arrival the pts O2 sats were 80% on RA despite the pt taking breathing treatments, therefore she was placed on NRB and received breathing treatment en route to ER.  Upon arrival to the ER she was transitioned to [place].  CXR revealed chronic emphysematous changes but no sign of pneumonia and initial troponin 0.61.  She received iv solumedrol, iv abx, duoneb, and iv magnesium.  She was subsequently admitted to the stepdown unit by hospitalist team for further workup and treatment.  SUBJECTIVE:  Off BiPAP X 24H. Offers no complaints. [name] trending down. No acute issues overnight. Intermittent confusion but able to be re-oriented  VITAL SIGNS: Temp:  [97.9 F (36.6 C)-98.3 F (36.8 C)] 97.9 F (36.6 C) (02/01 2300) Pulse Rate:  [88-123] 88 (02/02 0505) Resp:  [15-40] 20 (02/02 0505) BP: (93-182)/(58-113) 135/61 (02/02 0500) SpO2:  [87 %-100 %] 98 % (02/02 0505) FiO2 (%):  [35 %] 35 % (02/01 0800)  PHYSICAL EXAMINATION: General: elderly female resting in bed, NAD  Neuro: alert confused to time, follows commands  HEENT: supple, no JVD  Cardiovascular: sinus tach, [name], systolic murmur present, no  R/G Lungs: diminished throughout, even, non labored  Abdomen: +BS x4, soft, non tender non distended  Musculoskeletal: normal bulk and tone, no edema  Skin: chronic stasis changed bilateral lower extremities, scattered ecchymosis   Recent Labs  Lab 07/07/17 0116 07/07/17 0426 07/08/17 0432  NA 139 141 137  K 4.6 4.4 4.5  CL 106 107 107  CO2 20* 19* 22  BUN 25* 25* 33*  CREATININE 1.61* 1.43* 1.31*  GLUCOSE 236* 184* 130*   Recent Labs  Lab 07/07/17 0116 07/07/17 0426 07/08/17 0432  HGB 9.0* 8.3* 7.4*  HCT 28.1* 26.1* 22.2*  WBC 15.1* 8.2 18.1*  PLT 465* 268 281   Dg Chest Portable 1 View  Result Date: 07/07/2017 CLINICAL DATA:  Respiratory distress EXAM: PORTABLE CHEST 1 VIEW COMPARISON:  05/19/2015 FINDINGS: Postoperative changes in the mediastinum. Heart size and pulmonary vascularity are normal. Probable emphysematous changes in the upper lungs. Scattered fibrosis in the lungs. No airspace disease or consolidation. No blunting of costophrenic angles. No pneumothorax. Calcification of the aorta. IMPRESSION: Emphysematous changes in the lungs. No consolidation or edema. Aortic atherosclerosis. Electronically Signed   By: Lucienne Capers M.D.   On: 07/07/2017 01:40    ASSESSMENT / PLAN: Acute on chronic hypoxic respiratory failure secondary to AECOPD  Elevated troponin's secondary to demand ischemia vs. NSTEMI  Acute renal failure Anemia without obvious acute blood loss -[name] trending down Hx: Hypothyroidism, Dementia, PVD, Arrhythmias, CAD, Current Smoker, and Depression  P: Off BiPAP; continue supplemental O2 Continue scheduled and prn bronchodilator therapy Continue nebulized  steroids and continue iv steroids will decrease dose Trend WBC and monitor fever curve Continue azithromycin Echo pending Heparin gtt discontinued Trend CBC and monitor for s/sx of bleeding and transfuse as needed Continue outpatient cardiac medications Stat EKG Decrease synthroid to  57mcg Trend BMP  Replace electrolytes as indicated  Monitor UOP Smoking cessation advice given; declined nicotine patch  Holly Clark S. [facility] ANP-BC Pulmonary and Critical Care Medicine [facility] Pager [phone] or [phone]  NB: This document was prepared using Dragon voice recognition software and may include unintentional dictation errors.

## 2017-07-08 NOTE — Progress Notes (Signed)
[place] [facility] [place] NAME: Holly Clark    MR#:  [id number]  DATE OF BIRTH:  1930/01/02  SUBJECTIVE:  CHIEF COMPLAINT:   Chief Complaint  Patient presents with  . Shortness of Breath   - Patient with known COPD not on home oxygen came in with hypoxic respiratory failure- was on BiPAP but much improved. -Also significant drop in hemoglobin noted this admission. Patient states that she has been having dark stools at home.  REVIEW OF SYSTEMS:  Review of Systems  Constitutional: Positive for malaise/fatigue. Negative for chills and fever.  HENT: Negative for congestion, ear discharge, hearing loss, nosebleeds and sinus pain.   Eyes: Negative for blurred vision and double vision.  Respiratory: Negative for cough, shortness of breath and wheezing.   Cardiovascular: Negative for chest pain and palpitations.  Gastrointestinal: Positive for melena. Negative for abdominal pain, blood in stool, constipation, diarrhea, nausea and vomiting.  Genitourinary: Negative for dysuria.  Musculoskeletal: Positive for myalgias.  Neurological: Positive for weakness. Negative for dizziness, speech change, focal weakness, seizures and headaches.  Psychiatric/Behavioral: Negative for depression.    DRUG ALLERGIES:  No Known Allergies  VITALS:  Blood pressure 113/60, pulse 82, temperature 98.1 F (36.7 C), temperature source Oral, resp. rate (!) 24, height 5\' 1"  (1.549 m), weight 43.5 kg (95 lb 14.4 oz), SpO2 98 %.  PHYSICAL EXAMINATION:  Physical Exam  GENERAL:  84 y.o.-year-old elderly patient lying in the bed with no acute distress.  EYES: Pupils equal, round, reactive to light and accommodation. No scleral icterus. Extraocular muscles intact.  HEENT: Head atraumatic, normocephalic. Oropharynx and nasopharynx clear. Remains on BiPAP NECK:  Supple, no jugular venous distention. No thyroid enlargement, no tenderness.  LUNGS: Tight breath sounds  bilaterally, with occasional scattered wheezing, no rales,rhonchi or crepitation. No use of accessory muscles of respiration.  CARDIOVASCULAR: S1, S2 normal. No murmurs, rubs, or gallops.  ABDOMEN: Soft, nontender, nondistended. Bowel sounds present. No organomegaly or mass.  EXTREMITIES: No pedal edema, cyanosis, or clubbing.  NEUROLOGIC: Cranial nerves II through XII are intact. Muscle strength 4-5/5 in all extremities. Sensation intact. Gait not checked. Global weakness present PSYCHIATRIC: The patient is alert and oriented x 3.  SKIN: No obvious rash, lesion, or ulcer.    LABORATORY PANEL:   CBC Recent Labs  Lab 07/08/17 0432  WBC 18.1*  HGB 7.4*  HCT 22.2*  PLT 281   ------------------------------------------------------------------------------------------------------------------  Chemistries  Recent Labs  Lab 07/08/17 0432  NA 137  K 4.5  CL 107  CO2 22  GLUCOSE 130*  BUN 33*  CREATININE 1.31*  CALCIUM 8.9   ------------------------------------------------------------------------------------------------------------------  Cardiac Enzymes Recent Labs  Lab 07/07/17 1652  TROPONINI 0.54*   ------------------------------------------------------------------------------------------------------------------  RADIOLOGY:  Dg Chest Portable 1 View  Result Date: 07/07/2017 CLINICAL DATA:  Respiratory distress EXAM: PORTABLE CHEST 1 VIEW COMPARISON:  05/19/2015 FINDINGS: Postoperative changes in the mediastinum. Heart size and pulmonary vascularity are normal. Probable emphysematous changes in the upper lungs. Scattered fibrosis in the lungs. No airspace disease or consolidation. No blunting of costophrenic angles. No pneumothorax. Calcification of the aorta. IMPRESSION: Emphysematous changes in the lungs. No consolidation or edema. Aortic atherosclerosis. Electronically Signed   By: Lucienne Capers M.D.   On: 07/07/2017 01:40    EKG:   Orders placed or performed during  the hospital encounter of 07/07/17  . ED EKG within 10 minutes  . ED EKG within 10 minutes  . EKG 12-Lead  .  EKG 12-Lead    ASSESSMENT AND PLAN:   84 year old female with past medical history significant for hypothyroidism, COPD not on home oxygen, mild dementia, CAD, ongoing smoking, peripheral vascular disease, depression and anxiety presents to hospital secondary to worsening shortness of breath.  1. Acute hypoxic respiratory failure-secondary to COPD exacerbation. -Currently on BiPAP, breathing is much improved so can be weaned to nasal cannula. -Appreciate pulmonary critical care consult. -Continue steroids, inhalers and nebulizers -On azithromycin.  2. Elevated troponin-could be demand ischemia from her hypoxia and also anemia. -Echocardiogram is pending. Continue heparin drip and recycle troponins -Denies any chest pain. -Patient has had history of CAD status post bypass graft surgery  3. Acute on chronic anemia-baseline hemoglobin seems to be around 11, significant drop noted, at 8.3 now. -For a few days patient states she has been having dark stools -Anemia labs have been ordered. No acute indication for transfusion at this time unless cardiac strain is worsening. - Iron deficiency noted. Hb was 12.9 in Nov 2018, so ordered GI consult.  4. CK D stage III-Baseline creatinine is around 1.4. Monitor, avoid nephrotoxins  5. Hypothyroidism-continue Synthroid  6. DVT prophylaxis-currently on heparin drip  PT eval. Transfer to floor today.   All the records are reviewed and case discussed with Care Management/Social Workerr. Management plans discussed with the patient, family and they are in agreement.  CODE STATUS: Full code  TOTAL TIME TAKING CARE OF THIS PATIENT: 33 minutes.   POSSIBLE D/C IN 2 DAYS, DEPENDING ON CLINICAL CONDITION. Her son and daughter are in room during my visit.  Vaughan Basta M.D on 07/08/2017 at 2:05 PM  Between 7am to 6pm - Pager -  [phone]  After 6pm go to [email or web address] - password [email or web address] [place] [facility]  Office  [phone]  CC: Primary care physician; Rusty Aus, MD

## 2017-07-08 NOTE — Consult Note (Signed)
Cephas Darby, MD [address]  [place]  [place], [place] [address]  Main: [phone]  Fax: [phone] Pager: [phone]   Consultation  Referring Provider:     No ref. provider found Primary Care Physician:  Rusty Aus, MD Primary Gastroenterologist:     none   Reason for Consultation:     Melena, iron deficiency anemia  Date of Admission:  07/07/2017 Date of Consultation:  07/08/2017         HPI:   Holly Clark is a 84 y.o. female with history of COPD, coronary artery disease, peripheral artery disease status post stent, hypertension, hyperlipidemia on aspirin 81 presents with acute hypoxic respiratory failure secondary to COPD exacerbation. Patient also was found to have mildly elevated troponin which was thought to be secondary to demand ischemia. She was started on heparin drip and was found to have dark stool yesterday. Per notes, patient was having dark stools at home as well. She also had drop in hemoglobin from 9 on admission, 07/07/17 to 7.4 today. Her baseline hemoglobin is normal as of 04/2017 which was 12.9 g/dl. Therefore, GI is consulted for further evaluation. Patient reports that she did not have any bowel movement today. She denies any upper or lower GI symptoms otherwise. She is currently being treated for COPD exacerbation, transferred out of ICU to floor when I saw her.  NSAIDs:  aspirin 81   Antiplts/Anticoagulants/Anti thrombotics:  none  GI Procedures:  unknown if patient had EGD or colonoscopy in the past  Past Medical History:  Diagnosis Date  . COPD (chronic obstructive pulmonary disease) ([place])   . Coronary artery disease   . Dementia   . Depression   . Hypothyroidism     Past Surgical History:  Procedure Laterality Date  . ABDOMINAL HYSTERECTOMY    . CARDIAC SURGERY    . CATARACT EXTRACTION Bilateral 2012  . CORONARY ARTERY BYPASS GRAFT    . [place]   patient unsure of which side  . HIP PINNING,CANNULATED  Right 05/19/2015   Procedure: CANNULATED HIP PINNING;  Surgeon: Hessie Knows, MD;  Location: [facility] ORS;  Service: Orthopedics;  Laterality: Right;    Prior to Admission medications   Medication Sig Start Date End Date Taking? Authorizing Provider  albuterol (PROVENTIL HFA;VENTOLIN HFA) 108 (90 BASE) MCG/ACT inhaler Inhale 2 puffs into the lungs every 6 (six) hours as needed for wheezing or shortness of breath. 05/22/15  Yes Epifanio Lesches, MD  alum & mag hydroxide-simeth (MAALOX/MYLANTA) 200-200-20 MG/5ML suspension Take 30 mLs by mouth every 4 (four) hours as needed for indigestion. 05/22/15  Yes Epifanio Lesches, MD  aspirin EC 81 MG tablet Take 81 mg by mouth daily.   Yes [provider]  budesonide (PULMICORT) 1 MG/2ML nebulizer solution Take 1 mg by nebulization 2 (two) times daily.   Yes [provider]  diltiazem (DILACOR XR) 120 MG 24 hr capsule Take 120 mg by mouth daily.   Yes [provider]  docusate sodium (COLACE) 100 MG capsule Take 1 capsule (100 mg total) by mouth 2 (two) times daily. 05/22/15  Yes Epifanio Lesches, MD  galantamine (RAZADYNE) 8 MG tablet Take 8 mg by mouth every morning.   Yes [provider]  levalbuterol (XOPENEX) 1.25 MG/3ML nebulizer solution Take 1.25 mg by nebulization every 6 (six) hours as needed for wheezing.   Yes [provider]  levothyroxine (SYNTHROID, LEVOTHROID) 100 MCG tablet Take 100 mcg by mouth  daily before breakfast.   Yes [provider]  nitroGLYCERIN (NITROSTAT) 0.4 MG SL tablet Place 0.4 mg under the tongue every 5 (five) minutes as needed for chest pain.   Yes [provider]  oral electrolytes (THERMOTABS) TABS tablet Take 1 tablet by mouth.   Yes [provider]  sertraline (ZOLOFT) 50 MG tablet Take 50 mg by mouth daily.   Yes [provider]  simvastatin (ZOCOR) 20 MG tablet Take 20 mg by mouth at bedtime.   Yes [provider]    telmisartan (MICARDIS) 40 MG tablet Take 40 mg by mouth daily.   Yes [provider]  Umeclidinium-Vilanterol (ANORO ELLIPTA) 62.5-25 MCG/INH AEPB Inhale 1 puff into the lungs daily.   Yes [provider]  amoxicillin-clavulanate (AUGMENTIN) 875-125 MG tablet Take 1 tablet by mouth 2 (two) times daily. Patient not taking: Reported on 07/07/2017 05/22/15   Epifanio Lesches, MD  azithromycin (ZITHROMAX) 500 MG tablet Take 1 tablet (500 mg total) by mouth daily. Patient not taking: Reported on 07/07/2017 05/22/15   Epifanio Lesches, MD  bisacodyl (DULCOLAX) 10 MG suppository Place 1 suppository (10 mg total) rectally daily as needed for moderate constipation. Patient not taking: Reported on 07/07/2017 05/22/15   Epifanio Lesches, MD  buPROPion [facility]) 75 MG tablet Take 75 mg by mouth daily.    [provider]  donepezil (ARICEPT) 5 MG tablet Take 5 mg by mouth at bedtime.    [provider]  enoxaparin (LOVENOX) 30 MG/0.3ML injection Inject 0.3 mLs (30 mg total) into the skin daily. Patient not taking: Reported on 07/07/2017 05/22/15   Epifanio Lesches, MD  HYDROcodone-acetaminophen (NORCO/VICODIN) 5-325 MG tablet Take 1-2 tablets by mouth every 6 (six) hours as needed for moderate pain. Patient not taking: Reported on 07/07/2017 05/22/15   Epifanio Lesches, MD  magnesium hydroxide (MILK OF MAGNESIA) 400 MG/5ML suspension Take 30 mLs by mouth daily as needed for mild constipation. Patient not taking: Reported on 07/07/2017 05/22/15   Epifanio Lesches, MD    Family History  Problem Relation Age of Onset  . Stroke Mother      Social History   Tobacco Use  . Smoking status: Current Every Day Smoker    Packs/day: 0.25    Types: Cigarettes  . Smokeless tobacco: Never Used  Substance Use Topics  . Alcohol use: Yes    Comment: rare, glass of wine occasionally  . Drug use: No    Allergies as of 07/07/2017  . (No Known Allergies)     Review of Systems:    All systems reviewed and negative except where noted in HPI.   Physical Exam:  Vital signs in last 24 hours: Temp:  [97.9 F (36.6 C)-98.3 F (36.8 C)] 98.3 F (36.8 C) (02/02 1700) Pulse Rate:  [73-106] 81 (02/02 1700) Resp:  [15-31] 20 (02/02 1700) BP: (93-157)/(52-113) 137/52 (02/02 1700) SpO2:  [86 %-100 %] 98 % (02/02 1700) Weight:  [95 lb (43.1 kg)] 95 lb (43.1 kg) (02/02 1700) Last BM Date: (unknown) General:   Pleasant, cooperative in NAD, oriented to place Head:  Normocephalic and atraumatic. Eyes:   No icterus.   Conjunctiva pink. PERRLA. Ears:  Normal auditory acuity. Neck:  Supple; no masses or thyroidomegaly Lungs: Respirations even and unlabored. Lungs clear to auscultation bilaterally.   No wheezes, crackles, or rhonchi.  Heart:  Regular rate and rhythm;  Without murmur, clicks, rubs or gallops Abdomen:  Soft, nondistended, nontender. Normal bowel sounds. No appreciable masses or hepatomegaly.  No rebound or guarding.  Rectal:  Not performed. Msk:  Symmetrical without gross deformities. Extremities:  Without edema, cyanosis or clubbing. Neurologic:  Alert and oriented x2;  grossly normal neurologically. Skin:  Intact without significant lesions or rashes, ecchymotic spots in her upper and lower extremities. Psych:  Alert and cooperative. Normal affect.  LAB RESULTS: CBC Latest Ref Rng & Units 07/08/2017 07/07/2017 07/07/2017  WBC 3.6 - 11.0 K/uL 18.1(H) 8.2 15.1(H)  Hemoglobin 12.0 - 16.0 g/dL 7.4(L) 8.3(L) 9.0(L)  Hematocrit 35.0 - 47.0 % 22.2(L) 26.1(L) 28.1(L)  Platelets 150 - 440 K/uL 281 268 465(H)    BMET BMP Latest Ref Rng & Units 07/08/2017 07/07/2017 07/07/2017  Glucose 65 - 99 mg/dL 130(H) 184(H) 236(H)  BUN 6 - 20 mg/dL 33(H) 25(H) 25(H)  Creatinine 0.44 - 1.00 mg/dL 1.31(H) 1.43(H) 1.61(H)  Sodium 135 - 145 mmol/L 137 141 139  Potassium 3.5 - 5.1 mmol/L 4.5 4.4 4.6  Chloride 101 - 111 mmol/L 107 107 106  CO2 22 - 32 mmol/L 22  19(L) 20(L)  Calcium 8.9 - 10.3 mg/dL 8.9 9.6 9.5    LFT Hepatic Function Latest Ref Rng & Units 05/28/2015 05/18/2015  Total Protein 6.5 - 8.1 g/dL 6.5 7.3  Albumin 3.5 - 5.0 g/dL 3.4(L) 4.1  AST 15 - 41 U/L 47(H) 28  ALT 14 - 54 U/L 53 21  Alk Phosphatase 38 - 126 U/L 92 98  Total Bilirubin 0.3 - 1.2 mg/dL 0.4 0.8     STUDIES: Dg Chest Portable 1 View  Result Date: 07/07/2017 CLINICAL DATA:  Respiratory distress EXAM: PORTABLE CHEST 1 VIEW COMPARISON:  05/19/2015 FINDINGS: Postoperative changes in the mediastinum. Heart size and pulmonary vascularity are normal. Probable emphysematous changes in the upper lungs. Scattered fibrosis in the lungs. No airspace disease or consolidation. No blunting of costophrenic angles. No pneumothorax. Calcification of the aorta. IMPRESSION: Emphysematous changes in the lungs. No consolidation or edema. Aortic atherosclerosis. Electronically Signed   By: Lucienne Capers M.D.   On: 07/07/2017 01:40      Impression / Plan:   Holly Clark is a 84 y.o. female with history of COPD, coronary artery disease status post CABG, peripheral vascular disease status post stent, hypertension, hyperlipidemia presents with acute hypoxic respiratory failure in the setting of COPD exacerbation as well as acute anemia with dark stools. Most likely her respiratory symptoms and mildly elevated troponin levels are in the setting of acute anemia. Her iron studies are consistent with iron deficiency.  - Recommend 1 unit of PRBCs given her history of coronary artery disease - Goal hemoglobin > 8 - Check troponin in a.m. prior to EGD - Feraheme - Continue Protonix 40 mg every 12 hours - NPO past midnight  - EGD tomorrow, if negative recommend colonoscopy +/- VCE   Thank you for involving me in the care of this patient.  To follow along with you    LOS: 1 day   Sherri Sear, MD  07/08/2017, 8:02 PM   Note: This dictation was prepared with Dragon dictation along with  smaller phrase technology. Any transcriptional errors that result from this process are unintentional.

## 2017-07-08 NOTE — Progress Notes (Signed)
hgb 8.3 will not order blood tx

## 2017-07-08 NOTE — Progress Notes (Signed)
Pt refuses to wear Bipap tonight

## 2017-07-08 NOTE — Plan of Care (Signed)
Patient confused throughout night but easily reoriented. Tele sitter at bedside and effective. Patient is alert and oriented to self. No complaints of pain. VSS on room air with shortness of breath only on exertion. Bed alarm on. No signs of acute distress at present. Will continue to monitor.    Education: Knowledge of General Education information will improve 07/08/2017 0540 - Progressing by Morene Crocker, RN Health Behavior/Discharge Planning: Ability to manage health-related needs will improve 07/08/2017 0540 - Progressing by Morene Crocker, RN Clinical Measurements: Ability to maintain clinical measurements within normal limits will improve 07/08/2017 0540 - Progressing by Morene Crocker, RN Will remain free from infection 07/08/2017 0540 - Progressing by Morene Crocker, RN Diagnostic test results will improve 07/08/2017 0540 - Progressing by Morene Crocker, RN Respiratory complications will improve 07/08/2017 0540 - Progressing by Morene Crocker, RN Cardiovascular complication will be avoided 07/08/2017 0540 - Progressing by Morene Crocker, RN Activity: Risk for activity intolerance will decrease 07/08/2017 0540 - Progressing by Morene Crocker, RN Nutrition: Adequate nutrition will be maintained 07/08/2017 0540 - Progressing by Morene Crocker, RN Coping: Level of anxiety will decrease 07/08/2017 0540 - Progressing by Morene Crocker, RN Elimination: Will not experience complications related to bowel motility 07/08/2017 0540 - Progressing by Morene Crocker, RN Will not experience complications related to urinary retention 07/08/2017 0540 - Progressing by Morene Crocker, RN Pain Managment: General experience of comfort will improve 07/08/2017 0540 - Progressing by Morene Crocker, RN Safety: Ability to remain free from injury will improve 07/08/2017 0540 - Progressing by Morene Crocker, RN Skin Integrity: Risk for impaired skin  integrity will decrease 07/08/2017 0540 - Progressing by Morene Crocker, RN

## 2017-07-09 ENCOUNTER — Encounter
Admission: EM | Disposition: A | Discharge status: Home Health | Payer: Self-pay | Source: Home / Self Care | Attending: Internal Medicine

## 2017-07-09 ENCOUNTER — Inpatient Hospital Stay: Payer: Medicare HMO | Admitting: Anesthesiology

## 2017-07-09 DIAGNOSIS — K2210 Ulcer of esophagus without bleeding: Secondary | ICD-10-CM

## 2017-07-09 DIAGNOSIS — D500 Iron deficiency anemia secondary to blood loss (chronic): Secondary | ICD-10-CM

## 2017-07-09 DIAGNOSIS — K449 Diaphragmatic hernia without obstruction or gangrene: Secondary | ICD-10-CM

## 2017-07-09 DIAGNOSIS — K257 Chronic gastric ulcer without hemorrhage or perforation: Secondary | ICD-10-CM

## 2017-07-09 HISTORY — PX: ESOPHAGOGASTRODUODENOSCOPY: SHX5428

## 2017-07-09 LAB — HEMOGLOBIN: Hemoglobin: 9.8 g/dL — ABNORMAL LOW (ref 12.0–16.0)

## 2017-07-09 LAB — PREPARE RBC (CROSSMATCH)

## 2017-07-09 LAB — HEMOGLOBIN AND HEMATOCRIT, BLOOD
HEMATOCRIT: 23.3 % — AB (ref 35.0–47.0)
HEMOGLOBIN: 7.7 g/dL — AB (ref 12.0–16.0)

## 2017-07-09 LAB — TROPONIN I: Troponin I: 0.43 ng/mL (ref <0.03)

## 2017-07-09 SURGERY — EGD (ESOPHAGOGASTRODUODENOSCOPY)
Anesthesia: General

## 2017-07-09 MED ORDER — PROPOFOL 500 MG/50ML IV EMUL
INTRAVENOUS | Status: DC | PRN
  Administered 2017-07-09: 100 ug/kg/min via INTRAVENOUS

## 2017-07-09 MED ORDER — SODIUM CHLORIDE 0.9 % IV SOLN: Freq: Once | INTRAVENOUS | Status: DC

## 2017-07-09 MED ORDER — SODIUM CHLORIDE 0.9 % IV SOLN
INTRAVENOUS | Status: DC
  Administered 2017-07-09: 1000 mL via INTRAVENOUS

## 2017-07-09 MED ORDER — PROPOFOL 10 MG/ML IV BOLUS
INTRAVENOUS | Status: DC | PRN
  Administered 2017-07-09: 30 mg via INTRAVENOUS

## 2017-07-09 MED ORDER — LEVALBUTEROL HCL 0.63 MG/3ML IN NEBU
0.6300 mg | INHALATION_SOLUTION | Freq: Two times a day (BID) | RESPIRATORY_TRACT | Status: DC
  Administered 2017-07-09 – 2017-07-10 (×2): 0.63 mg via RESPIRATORY_TRACT
  Filled 2017-07-09 (×2): qty 3

## 2017-07-09 MED ORDER — SODIUM CHLORIDE 0.9 % IV SOLN
Freq: Once | INTRAVENOUS | Status: AC
  Administered 2017-07-09: 08:00:00 via INTRAVENOUS

## 2017-07-09 NOTE — Op Note (Addendum)
[facility] Gastroenterology Patient Name: Holly Clark Procedure Date: 07/09/2017 2:02 PM MRN: [id number] Account #: [id number] Date of Birth: [birth year] Admit Type: Inpatient Age: 84 Room: [facility] ENDO ROOM 4 Gender: Female Note Status: Finalized Procedure:            Upper GI endoscopy Indications:          Iron deficiency anemia due to suspected upper                        gastrointestinal bleeding, Melena Providers:            Lin Landsman MD, MD Referring MD:         Rusty Aus, MD (Referring MD) Medicines:            Monitored Anesthesia Care Complications:        No immediate complications. Estimated blood loss: None. Procedure:            Pre-Anesthesia Assessment:                       - Prior to the procedure, a History and Physical was                        performed, and patient medications and allergies were                        reviewed. The patient is unable to give consent                        secondary to the patient being legally incompetent to                        consent. The risks and benefits of the procedure and                        the sedation options and risks were discussed with the                        patient's daughter. All questions were answered and                        informed consent was obtained. Patient identification                        and proposed procedure were verified by the physician,                        the nurse, the anesthesiologist, the anesthetist and                        the technician in the pre-procedure area in the                        procedure room. Mental Status Examination: alert but                        confused. Airway Examination: normal oropharyngeal                        airway and neck mobility. Respiratory Examination:  bibasilar crackles. CV Examination: normal.                        Prophylactic Antibiotics: The patient does not require                    prophylactic antibiotics. Prior Anticoagulants: The                        patient has taken aspirin, last dose was 1 day prior to                        procedure. ASA Grade Assessment: III - A patient with                        severe systemic disease. After reviewing the risks and                        benefits, the patient was deemed in satisfactory                        condition to undergo the procedure. The anesthesia plan                        was to use monitored anesthesia care (MAC). Immediately                        prior to administration of medications, the patient was                        re-assessed for adequacy to receive sedatives. The                        heart rate, respiratory rate, oxygen saturations, blood                        pressure, adequacy of pulmonary ventilation, and                        response to care were monitored throughout the                        procedure. The physical status of the patient was                        re-assessed after the procedure.                       After obtaining informed consent, the endoscope was                        passed under direct vision. Throughout the procedure,                        the patient's blood pressure, pulse, and oxygen                        saturations were monitored continuously. The Endoscope                        was introduced through  the mouth, and advanced to the                        second part of duodenum. The upper GI endoscopy was                        accomplished without difficulty. The patient tolerated                        the procedure well. Findings:      The duodenal bulb and second portion of the duodenum were normal.      A 5 cm hiatal hernia was present.      Two localized, 6 mm non-bleeding erosions were found in the hernial sac.       There were no stigmata of recent bleeding.      LA Grade D (one or more mucosal breaks involving at least 75%  of       esophageal circumference) esophagitis with no bleeding was found in the       lower third of the esophagus. Impression:           - Normal duodenal bulb and second portion of the                        duodenum.                       - 5 cm hiatal hernia.                       - Cameron erosions, non-bleeding                       - LA Grade D erosive esophagitis, source of melena and                        IDA.                       - No specimens collected. Recommendation:       - Return patient to hospital ward for ongoing care.                       - Advance diet as tolerated today.                       - Continue present medications.                       - No ibuprofen, naproxen, or other non-steroidal                        anti-inflammatory drugs.                       - Use Prilosec (omeprazole) 20 mg PO BID for the rest                        of the patient's life.                       - Oral iron 369m daily with food                       -  Follow up with GI as out pt in 1-2weeks after                        discharge                       - No further GI work up needed at this time Procedure Code(s):    --- Professional ---                       [id number], Esophagogastroduodenoscopy, flexible, transoral;                        diagnostic, including collection of specimen(s) by                        brushing or washing, when performed (separate procedure) Diagnosis Code(s):    --- Professional ---                       K44.9, Diaphragmatic hernia without obstruction or                        gangrene                       K31.89, Other diseases of stomach and duodenum                       D50.9, Iron deficiency anemia, unspecified                       K20.8, Other esophagitis                       K92.1, Melena (includes Hematochezia) CPT copyright 2016 American [employer] Association. All rights reserved. The codes documented in this report are preliminary and  upon coder review may  be revised to meet current compliance requirements. Dr. Ulyess Mort Lin Landsman MD, MD 07/09/2017 2:23:20 PM This report has been signed electronically. Number of Addenda: 0 Note Initiated On: 07/09/2017 2:02 PM      [facility]

## 2017-07-09 NOTE — Transfer of Care (Signed)
Immediate Anesthesia Transfer of Care Note  Patient: Holly LASSETER  Procedure(s) Performed: ESOPHAGOGASTRODUODENOSCOPY (EGD) (N/A )  Patient Location: Endoscopy Unit  Anesthesia Type:General  Level of Consciousness: awake  Airway & Oxygen Therapy: Patient Spontanous Breathing and Patient connected to nasal cannula oxygen  Post-op Assessment: Report given to RN and Post -op Vital signs reviewed and stable  Post vital signs: Reviewed  Last Vitals:  Vitals:   07/09/17 1410 07/09/17 1417  BP: 134/85 134/85  Pulse: 76 74  Resp: 16 16  Temp:    SpO2: 100% 100%    Last Pain:  Vitals:   07/09/17 1131  TempSrc: Oral  PainSc:          Complications: No apparent anesthesia complications

## 2017-07-09 NOTE — Progress Notes (Signed)
Patient returned from endo. Madlyn Frankel, RN

## 2017-07-09 NOTE — Progress Notes (Addendum)
PT Cancellation Note  Patient Details Name: Holly Clark MRN: [id number] DOB: [birth year]   Cancelled Treatment:     Pt requested that PT hold evaluation due to the fact that pt was just receiving her first meal in 24 hrs and is fatigued due to just having had  a procedure done.  PT re-attempt 07/09/2017.   Roxanne Gates, PT, DPT 07/09/2017, 4:44 PM

## 2017-07-09 NOTE — Anesthesia Post-op Follow-up Note (Signed)
Anesthesia QCDR form completed.        

## 2017-07-09 NOTE — Progress Notes (Signed)
Spoke with Dr. Marius Ditch at about 2100. Dr. Marius Ditch wanted pt to receive 1 unit of blood but did not want to do the attestation and referred me to the hospitalist. Dr. Marius Ditch also ordered [name] every 8hrs.Spoke with hospitalist Dr. Benjie Karvonen about 2145, [name] had resulted and hgb was 8.3. Hospitalist refused to do the attestation and report pt does not need blood transfusion at this time. Orders for type and screen was placed should pt [name] dropped at next check. Awaiting results of next [name]. No signs of distress noted. Will continue to monitor.

## 2017-07-09 NOTE — Progress Notes (Signed)
Pt is refusing to wear BIPAP for sleep. Family at bedside. Pt encouraged to call should she change her mind.

## 2017-07-09 NOTE — Progress Notes (Signed)
[facility] [place] NAME: Holly Clark    MR#:  [id number]  DATE OF BIRTH:  Aug 25, 1929  SUBJECTIVE:  CHIEF COMPLAINT:   Chief Complaint  Patient presents with  . Shortness of Breath   - Patient with known COPD not on home oxygen came in with hypoxic respiratory failure- was on BiPAP but much improved. - Also significant drop in hemoglobin noted this admission. Patient states that she has been having dark stools at home. Hb dropped , receiving one unit PRBC today.  REVIEW OF SYSTEMS:  Review of Systems  Constitutional: Positive for malaise/fatigue. Negative for chills and fever.  HENT: Negative for congestion, ear discharge, hearing loss, nosebleeds and sinus pain.   Eyes: Negative for blurred vision and double vision.  Respiratory: Negative for cough, shortness of breath and wheezing.   Cardiovascular: Negative for chest pain and palpitations.  Gastrointestinal: Positive for melena. Negative for abdominal pain, blood in stool, constipation, diarrhea, nausea and vomiting.  Genitourinary: Negative for dysuria.  Musculoskeletal: Positive for myalgias.  Neurological: Positive for weakness. Negative for dizziness, speech change, focal weakness, seizures and headaches.  Psychiatric/Behavioral: Negative for depression.    DRUG ALLERGIES:  No Known Allergies  VITALS:  Blood pressure 120/62, pulse (!) 59, temperature 97.8 F (36.6 C), temperature source Oral, resp. rate 14, height 5\' 1"  (1.549 m), weight 43.1 kg (95 lb), SpO2 98 %.  PHYSICAL EXAMINATION:  Physical Exam  GENERAL:  84 y.o.-year-old elderly patient lying in the bed with no acute distress.  EYES: Pupils equal, round, reactive to light and accommodation. No scleral icterus. Extraocular muscles intact.  HEENT: Head atraumatic, normocephalic. Oropharynx and nasopharynx clear. Remains on BiPAP NECK:  Supple, no jugular venous distention. No thyroid enlargement, no tenderness.    LUNGS: Tight breath sounds bilaterally, with occasional scattered wheezing, no rales,rhonchi or crepitation. No use of accessory muscles of respiration.  CARDIOVASCULAR: S1, S2 normal. No murmurs, rubs, or gallops.  ABDOMEN: Soft, nontender, nondistended. Bowel sounds present. No organomegaly or mass.  EXTREMITIES: No pedal edema, cyanosis, or clubbing.  NEUROLOGIC: Cranial nerves II through XII are intact. Muscle strength 4-5/5 in all extremities. Sensation intact. Gait not checked. Global weakness present PSYCHIATRIC: The patient is alert and oriented x 3.  SKIN: No obvious rash, lesion, or ulcer.    LABORATORY PANEL:   CBC Recent Labs  Lab 07/08/17 0432  07/09/17 0537 07/09/17 1148  WBC 18.1*  --   --   --   HGB 7.4*   < > 7.7* 9.8*  HCT 22.2*   < > 23.3*  --   PLT 281  --   --   --    < > = values in this interval not displayed.   ------------------------------------------------------------------------------------------------------------------  Chemistries  Recent Labs  Lab 07/08/17 0432  NA 137  K 4.5  CL 107  CO2 22  GLUCOSE 130*  BUN 33*  CREATININE 1.31*  CALCIUM 8.9   ------------------------------------------------------------------------------------------------------------------  Cardiac Enzymes Recent Labs  Lab 07/09/17 0537  TROPONINI 0.43*   ------------------------------------------------------------------------------------------------------------------  RADIOLOGY:  No results found.  EKG:   Orders placed or performed during the hospital encounter of 07/07/17  . EKG 12-Lead  . EKG 12-Lead    ASSESSMENT AND PLAN:   84 year old female with past medical history significant for hypothyroidism, COPD not on home oxygen, mild dementia, CAD, ongoing smoking, peripheral vascular disease, depression and anxiety presents to hospital secondary to worsening shortness of breath.  1. Acute  hypoxic respiratory failure-secondary to COPD  exacerbation. -Currently on BiPAP, breathing is much improved so can be weaned to nasal cannula. -Appreciate pulmonary critical care consult. -Continue steroids, inhalers and nebulizers -On azithromycin.  2. Elevated troponin-could be demand ischemia from her hypoxia and also anemia. -Echocardiogram is pending. Continue heparin drip and recycle troponins -Denies any chest pain. -Patient has had history of CAD status post bypass graft surgery.  3. Acute on chronic anemia-baseline hemoglobin seems to be around 11, significant drop noted, at 7.7 now. -For a few days patient states she has been having dark stools -Anemia labs have been ordered. No acute indication for transfusion at this time unless cardiac strain is worsening. - Iron deficiency noted. Hb was 12.9 in Nov 2018, so ordered GI consult.  Plan for EGD today. - transfuse 1 unit PRBC 07/09/16  4. CK D stage III-Baseline creatinine is around 1.4. Monitor, avoid nephrotoxins  5. Hypothyroidism-continue Synthroid  6. DVT prophylaxis-currently on heparin drip  PT eval. Transfer to floor today.   All the records are reviewed and case discussed with Care Management/Social Workerr. Management plans discussed with the patient, family and they are in agreement.  CODE STATUS: Full code  TOTAL TIME TAKING CARE OF THIS PATIENT: 33 minutes.   POSSIBLE D/C IN 2 DAYS, DEPENDING ON CLINICAL CONDITION. Her son and daughter are in room during my visit.  Vaughan Basta M.D on 07/09/2017 at 12:25 PM  Between 7am to 6pm - Pager - [phone]  After 6pm go to [email or web address] - password [email or web address] [facility]  Office  [phone]  CC: Primary care physician; Rusty Aus, MD

## 2017-07-09 NOTE — Progress Notes (Signed)
EGD post procedure note:  Findings: - Normal duodenal bulb and second portion of the duodenum. - 5 cm hiatal hernia. - Cameron erosions, non-bleeding - LA Grade D erosive esophagitis, source of melena and IDA. - No specimens collected.  Recs: - Return patient to hospital ward for ongoing care. - Advance diet as tolerated today. - Continue present medications. - No ibuprofen, naproxen, or other non-steroidal anti-inflammatory drugs. - Use Prilosec (omeprazole) 20 mg PO BID for the rest of the patient's life due to presence of large hiatal hernia. - Probably not a surgical candidate for hernia repair due to her co-morbidities - Oral iron 325mg  daily with food - Follow up with GI as out pt in 1-2weeks after discharge - No further GI work up needed at this time  Cephas Darby, MD [address]  [place], [place] [address]  Main: [phone]  Fax: [phone] Pager: [phone]

## 2017-07-09 NOTE — Progress Notes (Signed)
Patient being transported to endo. Daughter to accompany for additional questions. Madlyn Frankel, RN

## 2017-07-09 NOTE — Anesthesia Preprocedure Evaluation (Signed)
Anesthesia Evaluation  Patient identified by MRN, date of birth, ID band Patient awake    Reviewed: Allergy & Precautions, H&P , NPO status , Patient's Chart, lab work & pertinent test results, reviewed documented beta blocker date and time   Airway Mallampati: II   Neck ROM: full    Dental  (+) Poor Dentition, Partial Upper   Pulmonary neg pulmonary ROS, COPD,  COPD inhaler, Current Smoker,    Pulmonary exam normal        Cardiovascular Exercise Tolerance: Poor + CAD  negative cardio ROS Normal cardiovascular exam Rhythm:regular Rate:Normal     Neuro/Psych PSYCHIATRIC DISORDERS negative neurological ROS  negative psych ROS   GI/Hepatic negative GI ROS, Neg liver ROS,   Endo/Other  negative endocrine ROSHypothyroidism   Renal/GU negative Renal ROS  negative genitourinary   Musculoskeletal   Abdominal   Peds  Hematology negative hematology ROS (+)   Anesthesia Other Findings Past Medical History: No date: COPD (chronic obstructive pulmonary disease) (HCC) No date: Coronary artery disease No date: Dementia No date: Depression No date: Hypothyroidism Past Surgical History: No date: ABDOMINAL HYSTERECTOMY No date: CARDIAC SURGERY 2012: CATARACT EXTRACTION; Bilateral No date: CORONARY ARTERY BYPASS GRAFT 1985: FEMORAL ARTERY STENT     Comment:  patient unsure of which side 05/19/2015: HIP PINNING,CANNULATED; Right     Comment:  Procedure: CANNULATED HIP PINNING;  Surgeon: Hessie Knows, MD;  Location: [facility] ORS;  Service: Orthopedics;                Laterality: Right; BMI    Body Mass Index:  17.95 kg/m     Reproductive/Obstetrics negative OB ROS                             Anesthesia Physical Anesthesia Plan  ASA: III and emergent  Anesthesia Plan: General   Post-op Pain Management:    Induction:   PONV Risk Score and Plan:   Airway Management Planned:    Additional Equipment:   Intra-op Plan:   Post-operative Plan:   Informed Consent: I have reviewed the patients History and Physical, chart, labs and discussed the procedure including the risks, benefits and alternatives for the proposed anesthesia with the patient or authorized representative who has indicated his/her understanding and acceptance.   Dental Advisory Given  Plan Discussed with: CRNA  Anesthesia Plan Comments:         Anesthesia Quick Evaluation

## 2017-07-10 ENCOUNTER — Inpatient Hospital Stay: Payer: Medicare HMO

## 2017-07-10 ENCOUNTER — Encounter: Payer: Self-pay | Admitting: Gastroenterology

## 2017-07-10 LAB — CBC
HCT: 30 % — ABNORMAL LOW (ref 35.0–47.0)
HEMOGLOBIN: 10.1 g/dL — AB (ref 12.0–16.0)
MCH: 29.9 pg (ref 26.0–34.0)
MCHC: 33.8 g/dL (ref 32.0–36.0)
MCV: 88.4 fL (ref 80.0–100.0)
PLATELETS: 285 10*3/uL (ref 150–440)
RBC: 3.4 MIL/uL — ABNORMAL LOW (ref 3.80–5.20)
RDW: 15.1 % — ABNORMAL HIGH (ref 11.5–14.5)
WBC: 9.9 10*3/uL (ref 3.6–11.0)

## 2017-07-10 LAB — BASIC METABOLIC PANEL
ANION GAP: 11 (ref 5–15)
BUN: 37 mg/dL — AB (ref 6–20)
CHLORIDE: 109 mmol/L (ref 101–111)
CO2: 22 mmol/L (ref 22–32)
Calcium: 9.1 mg/dL (ref 8.9–10.3)
Creatinine, Ser: 1.43 mg/dL — ABNORMAL HIGH (ref 0.44–1.00)
GFR calc Af Amer: 38 mL/min — ABNORMAL LOW (ref >60)
GFR calc non Af Amer: 32 mL/min — ABNORMAL LOW (ref >60)
GLUCOSE: 112 mg/dL — AB (ref 65–99)
Potassium: 4.5 mmol/L (ref 3.5–5.1)
SODIUM: 142 mmol/L (ref 135–145)

## 2017-07-10 MED ORDER — PREDNISONE 10 MG (21) PO TBPK: ORAL_TABLET | ORAL | 0 refills | Status: DC

## 2017-07-10 MED ORDER — SERTRALINE HCL 50 MG PO TABS
50.0000 mg | ORAL_TABLET | Freq: Every day | ORAL | Status: DC
  Administered 2017-07-11: 10:00:00 50 mg via ORAL
  Filled 2017-07-10: qty 1

## 2017-07-10 MED ORDER — FERROUS SULFATE 325 (65 FE) MG PO TABS: 325.0000 mg | ORAL_TABLET | Freq: Two times a day (BID) | ORAL | 2 refills | Status: DC

## 2017-07-10 MED ORDER — PANTOPRAZOLE SODIUM 40 MG PO TBEC
40.0000 mg | DELAYED_RELEASE_TABLET | Freq: Two times a day (BID) | ORAL | Status: DC
  Administered 2017-07-10 – 2017-07-11 (×3): 40 mg via ORAL
  Filled 2017-07-10 (×3): qty 1

## 2017-07-10 MED ORDER — UMECLIDINIUM-VILANTEROL 62.5-25 MCG/INH IN AEPB
1.0000 | INHALATION_SPRAY | Freq: Every day | RESPIRATORY_TRACT | Status: DC
  Filled 2017-07-10: qty 14

## 2017-07-10 MED ORDER — PREDNISONE 50 MG PO TABS
50.0000 mg | ORAL_TABLET | Freq: Every day | ORAL | Status: DC
  Administered 2017-07-10: 08:00:00 50 mg via ORAL
  Filled 2017-07-10: qty 1

## 2017-07-10 MED ORDER — BUDESONIDE 0.5 MG/2ML IN SUSP
1.0000 mg | Freq: Two times a day (BID) | RESPIRATORY_TRACT | Status: DC
  Administered 2017-07-10 – 2017-07-11 (×2): 1 mg via RESPIRATORY_TRACT
  Filled 2017-07-10 (×3): qty 4

## 2017-07-10 MED ORDER — LEVALBUTEROL HCL 1.25 MG/0.5ML IN NEBU
1.2500 mg | INHALATION_SOLUTION | Freq: Two times a day (BID) | RESPIRATORY_TRACT | Status: DC
Start: 2017-07-10 — End: 2017-07-11
  Administered 2017-07-10 – 2017-07-11 (×2): 1.25 mg via RESPIRATORY_TRACT
  Filled 2017-07-10 (×2): qty 0.5

## 2017-07-10 MED ORDER — METHYLPREDNISOLONE SODIUM SUCC 125 MG IJ SOLR
60.0000 mg | INTRAMUSCULAR | Status: DC
  Administered 2017-07-10: 60 mg via INTRAVENOUS
  Filled 2017-07-10: qty 2

## 2017-07-10 MED ORDER — FERROUS SULFATE 325 (65 FE) MG PO TABS
325.0000 mg | ORAL_TABLET | Freq: Two times a day (BID) | ORAL | Status: DC
  Administered 2017-07-10 – 2017-07-11 (×2): 325 mg via ORAL
  Filled 2017-07-10 (×2): qty 1

## 2017-07-10 MED ORDER — LEVOTHYROXINE SODIUM 100 MCG PO TABS
100.0000 ug | ORAL_TABLET | Freq: Every day | ORAL | Status: DC
  Administered 2017-07-11: 100 ug via ORAL
  Filled 2017-07-10: qty 1

## 2017-07-10 MED ORDER — METOPROLOL TARTRATE 25 MG PO TABS: 12.5000 mg | ORAL_TABLET | Freq: Two times a day (BID) | ORAL | 0 refills | Status: DC

## 2017-07-10 MED ORDER — PANTOPRAZOLE SODIUM 40 MG PO TBEC: 40.0000 mg | DELAYED_RELEASE_TABLET | Freq: Two times a day (BID) | ORAL | 0 refills | Status: DC

## 2017-07-10 NOTE — Care Management Note (Addendum)
Case Management Note  Patient Details  Name: Holly Clark MRN: [id number] Date of Birth: [birth year]  Subjective/Objective:  Admitted to [facility] with the diagnosis of COPD. Daughter,   Holly Clark [phone]). Last seen Dr. Emily Filbert August 8th 2018. Prescriptions ar filled at [facility]. Home Health per [facility] 3 weeks ago. [facility] following hip surgery. Cane, rolling walker, and shower chair in the home. Self feed, needs help with baths and bathing. [facility] in January. Good appetite.  Family will transport.                Action/Plan: Physical therapy evaluation completed. Recommending Home with Home Health. Would like [facility] again. [facility] unable to accept Holly Clark for services. Services will be provided by [facility]   Expected Discharge Date:  07/09/17               Expected Discharge Plan:     In-House Referral:   yes  Discharge planning Services  CM Consult  Post Acute Care Choice:  Home Health Choice offered to:  Patient, Adult Children  DME Arranged:    DME Agency:     HH Arranged:  RN, PT, Social Work [employer] Agency:  [facility]  Status of Service:  In process, will continue to follow  If discussed at Long Length of Stay Meetings, dates discussed:    Additional Comments:  Shelbie Ammons, RN MSN CCM Care Management [phone] 07/10/2017, 12:45 PM

## 2017-07-10 NOTE — Evaluation (Signed)
Physical Therapy Evaluation Patient Details Name: Holly Clark MRN: [id number] DOB: [birth year] Today's Date: 07/10/2017   History of Present Illness  Pt admitted for COPD exacerbation. Pt complains of hypoxic respiratory failure. HIstory includes COPD, dementia, depression and CAD.   Clinical Impression  Pt is a pleasant 84 year old female who was admitted for COPD exacerbation. Pt performs bed mobility with mod I, transfers with cga, and ambulation with cga and RW. Pt demonstrates deficits with strength/endurance/mobility. Pt fatigues quickly with increased dyspnea present. O2 sats on RA at 98%. Educated on slowly RR. Pt reports she is just "lazy" and prefers to stay in bed or on the couch. Educated on muscle atrophy and benefits of therapy. Would benefit from skilled PT to address above deficits and promote optimal return to PLOF. Recommend transition to [name] upon discharge from acute hospitalization.       Follow Up Recommendations Home health PT;Supervision for mobility/OOB    Equipment Recommendations  None recommended by PT    Recommendations for Other Services       Precautions / Restrictions Precautions Precautions: Fall Restrictions Weight Bearing Restrictions: No      Mobility  Bed Mobility Overal bed mobility: Modified Independent             General bed mobility comments: uses railing for assistance. Able to sit at EOB with independence. Once tranferring back to supine, pt needs cues for scooting up towards HOB.  Transfers Overall transfer level: Needs assistance Equipment used: Rolling walker (2 wheeled) Transfers: Sit to/from Stand Sit to Stand: Min guard         General transfer comment: cues for safe hand placement. Upright posture noted.  Ambulation/Gait Ambulation/Gait assistance: Min guard Ambulation Distance (Feet): 100 Feet Assistive device: Rolling walker (2 wheeled) Gait Pattern/deviations: Step-to pattern     General Gait Details:  ambulated 19' with min assist as pt unsteady and too far away from RW. On 2nd attempt, RW adjusted and pt followed commands for staying inside RW only requiring cga. Improved safety noted  Stairs            Wheelchair Mobility    Modified Rankin (Stroke Patients Only)       Balance Overall balance assessment: History of Falls;Needs assistance Sitting-balance support: Feet supported Sitting balance-Leahy Scale: Normal     Standing balance support: Bilateral upper extremity supported Standing balance-Leahy Scale: Good                               Pertinent Vitals/Pain Pain Assessment: No/denies pain    Home Living Family/patient expects to be discharged to:: Private residence Living Arrangements: Children Available Help at Discharge: Available 24 hours/day(son) Type of Home: House Home Access: Stairs to enter Entrance Stairs-Rails: Can reach both Entrance Stairs-Number of Steps: 3 Home Layout: One level Home Equipment: Grab bars - toilet;Grab bars - tub/shower;Walker - 2 wheels;Bedside commode      Prior Function Level of Independence: Independent with assistive device(s)         Comments: uses RW at all times     Hand Dominance        Extremity/Trunk Assessment   Upper Extremity Assessment Upper Extremity Assessment: Overall WFL for tasks assessed    Lower Extremity Assessment Lower Extremity Assessment: Generalized weakness(B LE grossly 4/5)       Communication   Communication: No difficulties  Cognition Arousal/Alertness: Awake/alert Behavior During Therapy: WFL for tasks assessed/performed Overall  Cognitive Status: History of cognitive impairments - at baseline                                        General Comments      Exercises Other Exercises Other Exercises: Pt able to ambulate to [name] with RW and cga. Supervision given for hygiene. Safe technique performed. Cues to use RW to transfer to [name]. Family  reports most of the time falls occur secondary to not using RW.   Assessment/Plan    PT Assessment Patient needs continued PT services  PT Problem List Decreased strength;Decreased balance;Decreased mobility;Decreased safety awareness       PT Treatment Interventions Gait training;DME instruction;Therapeutic exercise;Balance training    PT Goals (Current goals can be found in the Care Plan section)  Acute Rehab PT Goals Patient Stated Goal: to go home PT Goal Formulation: With patient Time For Goal Achievement: 07/24/17 Potential to Achieve Goals: Good    Frequency Min 2X/week   Barriers to discharge        Co-evaluation               AM-PAC PT "6 Clicks" Daily Activity  Outcome Measure Difficulty turning over in bed (including adjusting bedclothes, sheets and blankets)?: A Little Difficulty moving from lying on back to sitting on the side of the bed? : A Little Difficulty sitting down on and standing up from a chair with arms (e.g., wheelchair, bedside commode, etc,.)?: Unable Help needed moving to and from a bed to chair (including a wheelchair)?: A Little Help needed walking in hospital room?: A Little Help needed climbing 3-5 steps with a railing? : A Little 6 Click Score: 16    End of Session Equipment Utilized During Treatment: Gait belt Activity Tolerance: Patient tolerated treatment well Patient left: in bed;with bed alarm set;with family/visitor present Nurse Communication: Mobility status PT Visit Diagnosis: Unsteadiness on feet (R26.81);Repeated falls (R29.6);Muscle weakness (generalized) (M62.81);History of falling (Z91.81);Difficulty in walking, not elsewhere classified (R26.2)    Time: 6378-5885 PT Time Calculation (min) (ACUTE ONLY): 30 min   Charges:   PT Evaluation $PT Eval Low Complexity: 1 Low PT Treatments $Therapeutic Activity: 8-22 mins   PT G Codes:        Greggory Stallion, PT, DPT [phone]   Mazi Brailsford 07/10/2017, 2:08  PM

## 2017-07-10 NOTE — Progress Notes (Signed)
[facility] at [place] NAME: Holly Clark    MR#:  [id number]  DATE OF BIRTH:  1929/09/12  SUBJECTIVE:  CHIEF COMPLAINT:   Chief Complaint  Patient presents with  . Shortness of Breath   - Patient with known COPD not on home oxygen came in with hypoxic respiratory failure- was on BiPAP but much improved. - Also significant drop in hemoglobin noted this admission. Patient states that she has been having dark stools at home. Hb dropped , receiving one unit PRBC  Hemoglobin stable, endoscopy done, patient feels better today.  REVIEW OF SYSTEMS:  Review of Systems  Constitutional: Positive for malaise/fatigue. Negative for chills and fever.  HENT: Negative for congestion, ear discharge, hearing loss, nosebleeds and sinus pain.   Eyes: Negative for blurred vision and double vision.  Respiratory: Negative for cough, shortness of breath and wheezing.   Cardiovascular: Negative for chest pain and palpitations.  Gastrointestinal: Positive for melena. Negative for abdominal pain, blood in stool, constipation, diarrhea, nausea and vomiting.  Genitourinary: Negative for dysuria.  Musculoskeletal: Positive for myalgias.  Neurological: Positive for weakness. Negative for dizziness, speech change, focal weakness, seizures and headaches.  Psychiatric/Behavioral: Negative for depression.    DRUG ALLERGIES:  Not on File  VITALS:  Blood pressure (!) 163/59, pulse 78, temperature 97.8 F (36.6 C), temperature source Oral, resp. rate 18, height 5\' 1"  (1.549 m), weight 43.1 kg (95 lb), SpO2 99 %.  PHYSICAL EXAMINATION:  Physical Exam  GENERAL:  84 y.o.-year-old elderly patient lying in the bed with no acute distress.  EYES: Pupils equal, round, reactive to light and accommodation. No scleral icterus. Extraocular muscles intact.  HEENT: Head atraumatic, normocephalic. Oropharynx and nasopharynx clear. Remains on BiPAP NECK:  Supple, no jugular venous  distention. No thyroid enlargement, no tenderness.  LUNGS: Tight breath sounds bilaterally, with occasional scattered wheezing, no rales,rhonchi or crepitation. No use of accessory muscles of respiration.  CARDIOVASCULAR: S1, S2 normal. No murmurs, rubs, or gallops.  ABDOMEN: Soft, nontender, nondistended. Bowel sounds present. No organomegaly or mass.  EXTREMITIES: No pedal edema, cyanosis, or clubbing.  NEUROLOGIC: Cranial nerves II through XII are intact. Muscle strength 4-5/5 in all extremities. Sensation intact. Gait not checked. Global weakness present PSYCHIATRIC: The patient is alert and oriented x 3.  SKIN: No obvious rash, lesion, or ulcer.    LABORATORY PANEL:   CBC Recent Labs  Lab 07/10/17 0805  WBC 9.9  HGB 10.1*  HCT 30.0*  PLT 285   ------------------------------------------------------------------------------------------------------------------  Chemistries  Recent Labs  Lab 07/10/17 0805  NA 142  K 4.5  CL 109  CO2 22  GLUCOSE 112*  BUN 37*  CREATININE 1.43*  CALCIUM 9.1   ------------------------------------------------------------------------------------------------------------------  Cardiac Enzymes Recent Labs  Lab 07/09/17 0537  TROPONINI 0.43*   ------------------------------------------------------------------------------------------------------------------  RADIOLOGY:  No results found.  EKG:   Orders placed or performed during the hospital encounter of 07/07/17  . EKG 12-Lead  . EKG 12-Lead    ASSESSMENT AND PLAN:   84 year old female with past medical history significant for hypothyroidism, COPD not on home oxygen, mild dementia, CAD, ongoing smoking, peripheral vascular disease, depression and anxiety presents to hospital secondary to worsening shortness of breath.  1. Acute hypoxic respiratory failure-secondary to COPD exacerbation. -Currently on BiPAP, breathing is much improved so can be weaned to nasal cannula. -Appreciate  pulmonary critical care consult. -Continue steroids, inhalers and nebulizers -On azithromycin. Finished 3 days.  2. Elevated troponin-could be  demand ischemia from her hypoxia and also anemia. -Echocardiogram is done, normal EF. Given heparin drip but held due to dropping hemoglobin. -Denies any chest pain. -Patient has had history of CAD status post bypass graft surgery.  Advised to follow with her cardiologist in office.  3. Acute on chronic anemia-baseline hemoglobin seems to be around 11, significant drop noted, at 7.7 now. -For a few days patient states she has been having dark stools -Anemia labs have been ordered. No acute indication for transfusion at this time unless cardiac strain is worsening. - Iron deficiency noted. Hb was 12.9 in Nov 2018, so ordered GI consult. - transfuse 1 unit PRBC 07/09/16- Hb stable - EGD shows esophagitis and hiatal hernia, GI suggested lifelong PPI .  4. CK D stage III-Baseline creatinine is around 1.4. Monitor, avoid nephrotoxins  5. Hypothyroidism-continue Synthroid  6. DVT prophylaxis- no chemical anticoagulants, continue SCDs due to GI bleed.  PT eval. suggested home with home health. I spoke to patient's daughter on phone, plan was to discharge her home today, but before that she tried to get up to go to the bathroom and was extremely short of breath and nursing assistance was accompanying her and she felt that patient is going to fall down on the floor up to the reaching to the bathroom. With this significant weakness and at home she would be alone for some time when son and daughter-in-law is not there, family is concerned and they requesting not to discharge the patient and get physical therapy evaluation again tomorrow. I agree with that and I would keep the patient in hospital today.   All the records are reviewed and case discussed with Care Management/Social Workerr. Management plans discussed with the patient, family and they are in  agreement.  CODE STATUS: Full code  TOTAL TIME TAKING CARE OF THIS PATIENT: 45 minutes.   POSSIBLE D/C IN 2 DAYS, DEPENDING ON CLINICAL CONDITION.   Vaughan Basta M.D on 07/10/2017 at 3:07 PM  Between 7am to 6pm - Pager - [phone]  After 6pm go to [email or web address] - password [email or web address] [facility]  Office  [phone]  CC: Primary care physician; Rusty Aus, MD

## 2017-07-10 NOTE — Progress Notes (Signed)
Pt c/o difficulty breathing. Placed on 2L O2 via nasal cannula. MD paged. Chest xray and solu-medrol per MD order. Will continue to monitor

## 2017-07-11 LAB — PREPARE RBC (CROSSMATCH)

## 2017-07-11 MED ORDER — PREDNISONE 50 MG PO TABS
50.0000 mg | ORAL_TABLET | Freq: Every day | ORAL | Status: DC
  Administered 2017-07-11: 10:00:00 50 mg via ORAL
  Filled 2017-07-11: qty 1

## 2017-07-11 NOTE — Anesthesia Postprocedure Evaluation (Signed)
Anesthesia Post Note  Patient: Holly Clark  Procedure(s) Performed: ESOPHAGOGASTRODUODENOSCOPY (EGD) (N/A )  Patient location during evaluation: PACU Anesthesia Type: General Level of consciousness: awake and alert Pain management: pain level controlled Vital Signs Assessment: post-procedure vital signs reviewed and stable Respiratory status: spontaneous breathing, nonlabored ventilation, respiratory function stable and patient connected to nasal cannula oxygen Cardiovascular status: blood pressure returned to baseline and stable Postop Assessment: no apparent nausea or vomiting Anesthetic complications: no     Last Vitals:  Vitals:   07/11/17 0744 07/11/17 1006  BP:  102/61  Pulse:  73  Resp:  20  Temp:  36.7 C  SpO2: 99% 100%    Last Pain:  Vitals:   07/11/17 1006  TempSrc: Oral  PainSc:                  Molli Barrows

## 2017-07-11 NOTE — Care Management Important Message (Signed)
Important Message  Patient Details  Name: Holly Clark MRN: [id number] Date of Birth: [birth year]   Medicare Important Message Given:  Yes    Shelbie Ammons, RN 07/11/2017, 7:18 AM

## 2017-07-11 NOTE — Plan of Care (Signed)
No distress alert.

## 2017-07-11 NOTE — Discharge Summary (Signed)
[facility] NAME: Holly Clark    MR#:  [id number]  DATE OF BIRTH:  [birth year]  DATE OF ADMISSION:  07/07/2017 ADMITTING PHYSICIAN: Saundra Shelling, MD  DATE OF DISCHARGE: 07/11/2017   PRIMARY CARE PHYSICIAN: Rusty Aus, MD    ADMISSION DIAGNOSIS:  COPD exacerbation ([place]) [J44.1] Acute respiratory failure with hypoxia ([place]) [J96.01]  DISCHARGE DIAGNOSIS:  Active Problems:   COPD exacerbation (H[facility])   Esophagitis, erosive   Iron deficiency anemia due to chronic blood loss   Lysbeth Galas lesion, chronic   Large hiatal hernia   SECONDARY DIAGNOSIS:   Past Medical History:  Diagnosis Date  . COPD (chronic obstructive pulmonary disease) ([place])   . Coronary artery disease   . Dementia   . Depression   . Hypothyroidism     HOSPITAL COURSE:   84 year old female with past medical history significant for hypothyroidism, COPD not on home oxygen, mild dementia, CAD, ongoing smoking, peripheral vascular disease, depression and anxiety presents to hospital secondary to worsening shortness of breath.  1. Acute hypoxic respiratory failure-secondary to COPD exacerbation. -Currently on BiPAP, breathing is much improved so can be weaned to nasal cannula. -Appreciate pulmonary critical care consult. -Continue steroids, inhalers and nebulizers -On azithromycin. Finished 3 days.  2. Elevated troponin-could be demand ischemia from her hypoxia and also anemia. -Echocardiogram is done, normal EF. Given heparin drip but held due to dropping hemoglobin. -Denies any chest pain. -Patient has had history of CAD status post bypass graft surgery.  Advised to follow with her cardiologist in office.  3. Acute on chronic anemia-baseline hemoglobin seems to be around 11, significant drop noted, at 7.7 now. -For a few days patient states she has been having dark stools -Anemia labs have been ordered. No acute indication for transfusion at this  time unless cardiac strain is worsening. - Iron deficiency noted. Hb was 12.9 in Nov 2018, so ordered GI consult. - transfuse 1 unit PRBC 07/09/80- Hb stable - EGD shows esophagitis and hiatal hernia, GI suggested lifelong PPI .  4. CK D stage III-Baseline creatinine is around 1.4. Monitor, avoid nephrotoxins  5. Hypothyroidism-continue Synthroid  6. DVT prophylaxis- no chemical anticoagulants, continue SCDs due to GI bleed.  PT eval. suggested home with home health. I spoke to patient's daughter on phone, plan was to discharge her home 07/10/17, but before that she tried to get up to go to the bathroom and was extremely short of breath and nursing assistance was accompanying her and she felt that patient is going to fall down on the floor up to the reaching to the bathroom. With this significant weakness and at home she would be alone for some time when son and daughter-in-law is not there, family is concerned and they requesting not to discharge the patient and re-evaluate. Next day 07/11/17- pt feels comfortable, and ambulate in room and with nurse fine. D/c home . Son is in agreement.   DISCHARGE CONDITIONS:   Stable.  CONSULTS OBTAINED:    DRUG ALLERGIES:  Not on File  DISCHARGE MEDICATIONS:   Allergies as of 07/11/2017   Not on File     Medication List    STOP taking these medications   amoxicillin-clavulanate 875-125 MG tablet Commonly known as:  AUGMENTIN   azithromycin 500 MG tablet Commonly known as:  ZITHROMAX   bisacodyl 10 MG suppository Commonly known as:  DULCOLAX   enoxaparin 30 MG/0.3ML injection Commonly known as:  LOVENOX  TAKE these medications   albuterol 108 (90 Base) MCG/ACT inhaler Commonly known as:  PROVENTIL HFA;VENTOLIN HFA Inhale 2 puffs into the lungs every 6 (six) hours as needed for wheezing or shortness of breath.   alum & mag hydroxide-simeth 200-200-20 MG/5ML suspension Commonly known as:  MAALOX/MYLANTA Take 30 mLs by mouth  every 4 (four) hours as needed for indigestion.   ANORO ELLIPTA 62.5-25 MCG/INH Aepb Generic drug:  umeclidinium-vilanterol Inhale 1 puff into the lungs daily.   aspirin EC 81 MG tablet Take 81 mg by mouth daily.   budesonide 1 MG/2ML nebulizer solution Commonly known as:  PULMICORT Take 1 mg by nebulization 2 (two) times daily.   buPROPion 75 MG tablet Commonly known as:  WELLBUTRIN Take 75 mg by mouth daily.   diltiazem 120 MG 24 hr capsule Commonly known as:  DILACOR XR Take 120 mg by mouth daily.   docusate sodium 100 MG capsule Commonly known as:  COLACE Take 1 capsule (100 mg total) by mouth 2 (two) times daily.   donepezil 5 MG tablet Commonly known as:  ARICEPT Take 5 mg by mouth at bedtime.   ferrous sulfate 325 (65 FE) MG tablet Take 1 tablet (325 mg total) by mouth 2 (two) times daily with a meal.   galantamine 8 MG tablet Commonly known as:  RAZADYNE Take 8 mg by mouth every morning.   HYDROcodone-acetaminophen 5-325 MG tablet Commonly known as:  NORCO/VICODIN Take 1-2 tablets by mouth every 6 (six) hours as needed for moderate pain.   levalbuterol 1.25 MG/3ML nebulizer solution Commonly known as:  XOPENEX Take 1.25 mg by nebulization every 6 (six) hours as needed for wheezing.   levothyroxine 100 MCG tablet Commonly known as:  SYNTHROID, LEVOTHROID Take 100 mcg by mouth daily before breakfast.   magnesium hydroxide 400 MG/5ML suspension Commonly known as:  MILK OF MAGNESIA Take 30 mLs by mouth daily as needed for mild constipation.   metoprolol tartrate 25 MG tablet Commonly known as:  LOPRESSOR Take 0.5 tablets (12.5 mg total) by mouth 2 (two) times daily.   nitroGLYCERIN 0.4 MG SL tablet Commonly known as:  NITROSTAT Place 0.4 mg under the tongue every 5 (five) minutes as needed for chest pain.   oral electrolytes Tabs tablet Take 1 tablet by mouth.   pantoprazole 40 MG tablet Commonly known as:  PROTONIX Take 1 tablet (40 mg total) by  mouth 2 (two) times daily.   predniSONE 10 MG (21) Tbpk tablet Commonly known as:  STERAPRED UNI-PAK 21 TAB Take 6 tabs first day, 5 tab on day 2, then 4 on day 3rd, 3 tabs on day 4th , 2 tab on day 5th, and 1 tab on 6th day.   sertraline 50 MG tablet Commonly known as:  ZOLOFT Take 50 mg by mouth daily.   simvastatin 20 MG tablet Commonly known as:  ZOCOR Take 20 mg by mouth at bedtime.   telmisartan 40 MG tablet Commonly known as:  MICARDIS Take 40 mg by mouth daily.        DISCHARGE INSTRUCTIONS:    Follow with PMD and cardiologist in 1-2 weeks.  If you experience worsening of your admission symptoms, develop shortness of breath, life threatening emergency, suicidal or homicidal thoughts you must seek medical attention immediately by calling 911 or calling your MD immediately  if symptoms less severe.  You Must read complete instructions/literature along with all the possible adverse reactions/side effects for all the Medicines you take and that have  been prescribed to you. Take any new Medicines after you have completely understood and accept all the possible adverse reactions/side effects.   Please note  You were cared for by a hospitalist during your hospital stay. If you have any questions about your discharge medications or the care you received while you were in the hospital after you are discharged, you can call the unit and asked to speak with the hospitalist on call if the hospitalist that took care of you is not available. Once you are discharged, your primary care physician will handle any further medical issues. Please note that NO REFILLS for any discharge medications will be authorized once you are discharged, as it is imperative that you return to your primary care physician (or establish a relationship with a primary care physician if you do not have one) for your aftercare needs so that they can reassess your need for medications and monitor your lab  values.    Today   CHIEF COMPLAINT:   Chief Complaint  Patient presents with  . Shortness of Breath    HISTORY OF PRESENT ILLNESS:  Astou Lada  is a 84 y.o. female with a known history of COPD, coronary artery disease, dementia, hypothyroidism presented to the emergency room with increased shortness of breath for the last 2 days.  Patient oxygen saturation was around 80% on room air she was put on nonrebreather mask initially by the EMS and was given breathing treatments in route to the hospital.  Patient was put on BiPAP and stabilized in the emergency room.  Patient was found to be in COPD exacerbation.  She was given IV Solu-Medrol and breathing treatments and IV antibiotics.  No complaints of any chest pain.  Hospitalist service was consulted for further care.  No fever and chills.   VITAL SIGNS:  Blood pressure 102/61, pulse 73, temperature 98.1 F (36.7 C), temperature source Oral, resp. rate 20, height 5\' 1"  (1.549 m), weight 43.1 kg (95 lb), SpO2 100 %.  I/O:    Intake/Output Summary (Last 24 hours) at 07/11/2017 1504 Last data filed at 07/11/2017 1412 Gross per 24 hour  Intake 860 ml  Output -  Net 860 ml    PHYSICAL EXAMINATION:   GENERAL:  84 y.o.-year-old elderly patient lying in the bed with no acute distress.  EYES: Pupils equal, round, reactive to light and accommodation. No scleral icterus. Extraocular muscles intact.  HEENT: Head atraumatic, normocephalic. Oropharynx and nasopharynx clear. Remains on BiPAP NECK:  Supple, no jugular venous distention. No thyroid enlargement, no tenderness.  LUNGS: Tight breath sounds bilaterally, with occasional scattered wheezing, no rales,rhonchi or crepitation. No use of accessory muscles of respiration.  CARDIOVASCULAR: S1, S2 normal. No murmurs, rubs, or gallops.  ABDOMEN: Soft, nontender, nondistended. Bowel sounds present. No organomegaly or mass.  EXTREMITIES: No pedal edema, cyanosis, or clubbing.  NEUROLOGIC: Cranial  nerves II through XII are intact. Muscle strength 4-5/5 in all extremities. Sensation intact. Gait not checked. Global weakness present PSYCHIATRIC: The patient is alert and oriented x 3.  SKIN: No obvious rash, lesion, or ulcer.    DATA REVIEW:   CBC Recent Labs  Lab 07/10/17 0805  WBC 9.9  HGB 10.1*  HCT 30.0*  PLT 285    Chemistries  Recent Labs  Lab 07/10/17 0805  NA 142  K 4.5  CL 109  CO2 22  GLUCOSE 112*  BUN 37*  CREATININE 1.43*  CALCIUM 9.1    Cardiac Enzymes Recent Labs  Lab 07/09/17  [facility] 0.43*    Microbiology Results  Results for orders placed or performed during the hospital encounter of 07/07/17  MRSA PCR Screening     Status: None   Collection Time: 07/07/17  4:01 AM  Result Value Ref Range Status   MRSA by PCR NEGATIVE NEGATIVE Final    Comment:        The GeneXpert MRSA Assay (FDA approved for NASAL specimens only), is one component of a comprehensive MRSA colonization surveillance program. It is not intended to diagnose MRSA infection nor to guide or monitor treatment for MRSA infections. Performed at [facility], [address] [place], [place] [address]     RADIOLOGY:  Dg Chest 1 View  Result Date: 07/10/2017 CLINICAL DATA:  Shortness of breath EXAM: CHEST 1 VIEW COMPARISON:  07/07/2017, 05/19/2015 FINDINGS: Post sternotomy changes. Hyperinflation with emphysematous changes. Streaky atelectasis at the left lung base. No pleural effusion. Stable cardiomediastinal silhouette with aortic atherosclerosis. No pneumothorax. Hiatal hernia. IMPRESSION: 1. Hyperinflation with emphysematous disease. No focal pulmonary infiltrate 2. Probable moderate hiatal hernia. Electronically Signed   By: Donavan Foil M.D.   On: 07/10/2017 20:44    EKG:   Orders placed or performed during the hospital encounter of 07/07/17  . EKG 12-Lead  . EKG 12-Lead   Management plans discussed with the patient, family and they are in  agreement.  CODE STATUS:     Code Status Orders  (From admission, onward)        Start     Ordered   07/07/17 0338  Full code  Continuous     07/07/17 0338    Code Status History    Date Active Date Inactive Code Status Order ID Comments User Context   05/19/2015 15:34 05/22/2015 18:54 Full Code [id number]  Hessie Knows, MD Inpatient   05/18/2015 17:41 05/19/2015 15:34 Full Code [id number]  Loletha Grayer, MD ED   05/18/2015 16:58 05/18/2015 17:41 Full Code [id number]  Hessie Knows, MD ED    Advance Directive Documentation     Most Recent Value  Type of Advance Directive  Healthcare Power of Attorney, Living will  Pre-existing out of facility DNR order (yellow form or pink MOST form)  No data  "MOST" Form in Place?  No data      TOTAL TIME TAKING CARE OF THIS PATIENT: 35 minutes.    Vaughan Basta M.D on 07/11/2017 at 3:04 PM  Between 7am to 6pm - Pager - [phone]  After 6pm go to [email or web address] - password [email or web address]  Office  [phone]  CC: Primary care physician; Rusty Aus, MD   Note: This dictation was prepared with Dragon dictation along with smaller phrase technology. Any transcriptional errors that result from this process are unintentional.

## 2017-07-11 NOTE — Progress Notes (Signed)
Pt for discharge home/ alert. resp  Even and unlaboured at rest  Dyspnea with increased exertion. No voiced c/o. Son at bedside.discharge instructions discussed with pt and son.  meds diet activity and f/u discussed.  Sl d/cd.

## 2017-07-12 LAB — TYPE AND SCREEN
ABO/RH(D): O POS
Antibody Screen: NEGATIVE
UNIT DIVISION: 0
Unit division: 0

## 2017-07-12 LAB — BPAM RBC
BLOOD PRODUCT EXPIRATION DATE: 201902202359
Blood Product Expiration Date: 201903012359
ISSUE DATE / TIME: 201902052040
ISSUE DATE / TIME: 201902030803
UNIT TYPE AND RH: 5100
Unit Type and Rh: 5100

## 2017-07-13 ENCOUNTER — Encounter: Payer: Self-pay | Admitting: *Deleted

## 2017-07-13 ENCOUNTER — Other Ambulatory Visit: Payer: Self-pay

## 2017-07-13 ENCOUNTER — Emergency Department: Payer: Medicare HMO

## 2017-07-13 ENCOUNTER — Observation Stay
Admission: EM | Admit: 2017-07-13 | Discharge: 2017-07-14 | Disposition: A | Discharge status: Home Health | Payer: Medicare HMO | Attending: Internal Medicine | Admitting: Internal Medicine

## 2017-07-13 DIAGNOSIS — N183 Chronic kidney disease, stage 3 (moderate): Secondary | ICD-10-CM | POA: Insufficient documentation

## 2017-07-13 DIAGNOSIS — Z951 Presence of aortocoronary bypass graft: Secondary | ICD-10-CM | POA: Diagnosis not present

## 2017-07-13 DIAGNOSIS — I471 Supraventricular tachycardia: Principal | ICD-10-CM | POA: Insufficient documentation

## 2017-07-13 DIAGNOSIS — K449 Diaphragmatic hernia without obstruction or gangrene: Secondary | ICD-10-CM | POA: Diagnosis not present

## 2017-07-13 DIAGNOSIS — F17210 Nicotine dependence, cigarettes, uncomplicated: Secondary | ICD-10-CM | POA: Diagnosis not present

## 2017-07-13 DIAGNOSIS — M549 Dorsalgia, unspecified: Secondary | ICD-10-CM | POA: Insufficient documentation

## 2017-07-13 DIAGNOSIS — F0390 Unspecified dementia without behavioral disturbance: Secondary | ICD-10-CM | POA: Insufficient documentation

## 2017-07-13 DIAGNOSIS — J441 Chronic obstructive pulmonary disease with (acute) exacerbation: Secondary | ICD-10-CM | POA: Diagnosis not present

## 2017-07-13 DIAGNOSIS — Z79899 Other long term (current) drug therapy: Secondary | ICD-10-CM | POA: Diagnosis not present

## 2017-07-13 DIAGNOSIS — I2510 Atherosclerotic heart disease of native coronary artery without angina pectoris: Secondary | ICD-10-CM | POA: Diagnosis not present

## 2017-07-13 DIAGNOSIS — E039 Hypothyroidism, unspecified: Secondary | ICD-10-CM | POA: Diagnosis not present

## 2017-07-13 DIAGNOSIS — Z7982 Long term (current) use of aspirin: Secondary | ICD-10-CM | POA: Diagnosis not present

## 2017-07-13 DIAGNOSIS — D72829 Elevated white blood cell count, unspecified: Secondary | ICD-10-CM | POA: Insufficient documentation

## 2017-07-13 DIAGNOSIS — I4892 Unspecified atrial flutter: Secondary | ICD-10-CM | POA: Insufficient documentation

## 2017-07-13 DIAGNOSIS — J9601 Acute respiratory failure with hypoxia: Secondary | ICD-10-CM | POA: Diagnosis not present

## 2017-07-13 DIAGNOSIS — M79602 Pain in left arm: Secondary | ICD-10-CM | POA: Diagnosis not present

## 2017-07-13 DIAGNOSIS — N179 Acute kidney failure, unspecified: Secondary | ICD-10-CM | POA: Diagnosis not present

## 2017-07-13 DIAGNOSIS — F329 Major depressive disorder, single episode, unspecified: Secondary | ICD-10-CM | POA: Insufficient documentation

## 2017-07-13 DIAGNOSIS — R748 Abnormal levels of other serum enzymes: Secondary | ICD-10-CM | POA: Diagnosis not present

## 2017-07-13 DIAGNOSIS — R000 Tachycardia, unspecified: Secondary | ICD-10-CM | POA: Diagnosis not present

## 2017-07-13 DIAGNOSIS — I499 Cardiac arrhythmia, unspecified: Secondary | ICD-10-CM | POA: Diagnosis not present

## 2017-07-13 DIAGNOSIS — M545 Low back pain: Secondary | ICD-10-CM | POA: Diagnosis not present

## 2017-07-13 LAB — CBC WITH DIFFERENTIAL/PLATELET
BAND NEUTROPHILS: 0 %
Basophils Absolute: 0 10*3/uL (ref 0–0.1)
Basophils Relative: 0 %
Blasts: 0 %
EOS ABS: 0 10*3/uL (ref 0–0.7)
EOS PCT: 0 %
HCT: 38.4 % (ref 35.0–47.0)
Hemoglobin: 12.4 g/dL (ref 12.0–16.0)
LYMPHS PCT: 2 %
Lymphs Abs: 0.3 10*3/uL — ABNORMAL LOW (ref 1.0–3.6)
MCH: 29.2 pg (ref 26.0–34.0)
MCHC: 32.2 g/dL (ref 32.0–36.0)
MCV: 90.6 fL (ref 80.0–100.0)
MONOS PCT: 9 %
Metamyelocytes Relative: 1 %
Monocytes Absolute: 1.4 10*3/uL — ABNORMAL HIGH (ref 0.2–0.9)
Myelocytes: 4 %
NEUTROS ABS: 13.6 10*3/uL — AB (ref 1.4–6.5)
NRBC: 0 /100{WBCs}
Neutrophils Relative %: 83 %
OTHER: 1 %
PLATELETS: 380 10*3/uL (ref 150–440)
Promyelocytes Absolute: 0 %
RBC: 4.24 MIL/uL (ref 3.80–5.20)
RDW: 15.2 % — ABNORMAL HIGH (ref 11.5–14.5)
WBC: 15.5 10*3/uL — ABNORMAL HIGH (ref 3.6–11.0)

## 2017-07-13 LAB — BASIC METABOLIC PANEL
Anion gap: 10 (ref 5–15)
BUN: 35 mg/dL — AB (ref 6–20)
CHLORIDE: 105 mmol/L (ref 101–111)
CO2: 25 mmol/L (ref 22–32)
CREATININE: 1.55 mg/dL — AB (ref 0.44–1.00)
Calcium: 9 mg/dL (ref 8.9–10.3)
GFR calc Af Amer: 34 mL/min — ABNORMAL LOW (ref >60)
GFR, EST NON AFRICAN AMERICAN: 29 mL/min — AB (ref >60)
Glucose, Bld: 256 mg/dL — ABNORMAL HIGH (ref 65–99)
Potassium: 4 mmol/L (ref 3.5–5.1)
SODIUM: 140 mmol/L (ref 135–145)

## 2017-07-13 LAB — MAGNESIUM: MAGNESIUM: 2.2 mg/dL (ref 1.7–2.4)

## 2017-07-13 LAB — TROPONIN I: TROPONIN I: 0.29 ng/mL — AB (ref <0.03)

## 2017-07-13 MED ORDER — DILTIAZEM HCL 25 MG/5ML IV SOLN
10.0000 mg | Freq: Once | INTRAVENOUS | Status: AC
  Administered 2017-07-13: 10 mg via INTRAVENOUS
  Filled 2017-07-13: qty 5

## 2017-07-13 MED ORDER — DILTIAZEM HCL ER 120 MG PO CP24
120.0000 mg | ORAL_CAPSULE | Freq: Every day | ORAL | Status: DC
  Administered 2017-07-14: 120 mg via ORAL
  Filled 2017-07-13 (×2): qty 1

## 2017-07-13 MED ORDER — PANTOPRAZOLE SODIUM 40 MG PO TBEC
40.0000 mg | DELAYED_RELEASE_TABLET | Freq: Two times a day (BID) | ORAL | Status: DC
  Administered 2017-07-13 – 2017-07-14 (×2): 40 mg via ORAL
  Filled 2017-07-13 (×2): qty 1

## 2017-07-13 MED ORDER — ALBUTEROL SULFATE (2.5 MG/3ML) 0.083% IN NEBU
2.5000 mg | INHALATION_SOLUTION | RESPIRATORY_TRACT | Status: DC | PRN
  Administered 2017-07-14: 2.5 mg via RESPIRATORY_TRACT
  Filled 2017-07-13: qty 3

## 2017-07-13 MED ORDER — FENTANYL CITRATE (PF) 100 MCG/2ML IJ SOLN
INTRAMUSCULAR | Status: AC
  Administered 2017-07-13: 25 ug via INTRAVENOUS
  Filled 2017-07-13: qty 2

## 2017-07-13 MED ORDER — DONEPEZIL HCL 5 MG PO TABS
5.0000 mg | ORAL_TABLET | Freq: Every day | ORAL | Status: DC
Start: 2017-07-13 — End: 2017-07-14
  Administered 2017-07-13: 5 mg via ORAL
  Filled 2017-07-13 (×2): qty 1

## 2017-07-13 MED ORDER — ACETAMINOPHEN 650 MG RE SUPP: 650.0000 mg | Freq: Four times a day (QID) | RECTAL | Status: DC | PRN

## 2017-07-13 MED ORDER — METOPROLOL TARTRATE 25 MG PO TABS
12.5000 mg | ORAL_TABLET | Freq: Two times a day (BID) | ORAL | Status: DC
  Administered 2017-07-13 – 2017-07-14 (×2): 12.5 mg via ORAL
  Filled 2017-07-13 (×2): qty 1

## 2017-07-13 MED ORDER — HEPARIN SODIUM (PORCINE) 5000 UNIT/ML IJ SOLN
5000.0000 [IU] | Freq: Three times a day (TID) | INTRAMUSCULAR | Status: DC
  Administered 2017-07-13 – 2017-07-14 (×2): 5000 [IU] via SUBCUTANEOUS
  Filled 2017-07-13 (×2): qty 1

## 2017-07-13 MED ORDER — DOCUSATE SODIUM 100 MG PO CAPS
100.0000 mg | ORAL_CAPSULE | Freq: Two times a day (BID) | ORAL | Status: DC
  Administered 2017-07-13: 100 mg via ORAL
  Filled 2017-07-13 (×2): qty 1

## 2017-07-13 MED ORDER — DILTIAZEM HCL 25 MG/5ML IV SOLN
12.0000 mg | Freq: Once | INTRAVENOUS | Status: AC
  Administered 2017-07-13: 12 mg via INTRAVENOUS

## 2017-07-13 MED ORDER — ETOMIDATE 2 MG/ML IV SOLN
INTRAVENOUS | Status: AC
  Administered 2017-07-13: 10 mg via INTRAVENOUS
  Filled 2017-07-13: qty 10

## 2017-07-13 MED ORDER — SODIUM CHLORIDE 0.9 % IV BOLUS (SEPSIS)
1000.0000 mL | Freq: Once | INTRAVENOUS | Status: AC
  Administered 2017-07-13: 1000 mL via INTRAVENOUS

## 2017-07-13 MED ORDER — NITROGLYCERIN 0.4 MG SL SUBL: 0.4000 mg | SUBLINGUAL_TABLET | SUBLINGUAL | Status: DC | PRN

## 2017-07-13 MED ORDER — SENNOSIDES-DOCUSATE SODIUM 8.6-50 MG PO TABS: 1.0000 | ORAL_TABLET | Freq: Every evening | ORAL | Status: DC | PRN

## 2017-07-13 MED ORDER — ASPIRIN EC 81 MG PO TBEC
81.0000 mg | DELAYED_RELEASE_TABLET | Freq: Every day | ORAL | Status: DC
  Administered 2017-07-14: 81 mg via ORAL
  Filled 2017-07-13: qty 1

## 2017-07-13 MED ORDER — FERROUS SULFATE 325 (65 FE) MG PO TABS
325.0000 mg | ORAL_TABLET | Freq: Two times a day (BID) | ORAL | Status: DC
  Administered 2017-07-14: 325 mg via ORAL
  Filled 2017-07-13: qty 1

## 2017-07-13 MED ORDER — BISACODYL 5 MG PO TBEC: 5.0000 mg | DELAYED_RELEASE_TABLET | Freq: Every day | ORAL | Status: DC | PRN

## 2017-07-13 MED ORDER — HYDROCODONE-ACETAMINOPHEN 5-325 MG PO TABS: 1.0000 | ORAL_TABLET | ORAL | Status: DC | PRN

## 2017-07-13 MED ORDER — BUPROPION HCL 75 MG PO TABS
75.0000 mg | ORAL_TABLET | Freq: Every day | ORAL | Status: DC
  Administered 2017-07-14: 75 mg via ORAL
  Filled 2017-07-13: qty 1

## 2017-07-13 MED ORDER — UMECLIDINIUM-VILANTEROL 62.5-25 MCG/INH IN AEPB
1.0000 | INHALATION_SPRAY | Freq: Every day | RESPIRATORY_TRACT | Status: DC
  Administered 2017-07-14: 1 via RESPIRATORY_TRACT
  Filled 2017-07-13: qty 14

## 2017-07-13 MED ORDER — SODIUM CHLORIDE 0.9 % IV SOLN
INTRAVENOUS | Status: DC
  Administered 2017-07-13: 22:00:00 via INTRAVENOUS

## 2017-07-13 MED ORDER — SERTRALINE HCL 50 MG PO TABS
50.0000 mg | ORAL_TABLET | Freq: Every day | ORAL | Status: DC
  Administered 2017-07-14: 50 mg via ORAL
  Filled 2017-07-13: qty 1

## 2017-07-13 MED ORDER — GALANTAMINE HYDROBROMIDE 4 MG PO TABS
8.0000 mg | ORAL_TABLET | Freq: Every morning | ORAL | Status: DC
  Administered 2017-07-14: 8 mg via ORAL
  Filled 2017-07-13: qty 2

## 2017-07-13 MED ORDER — LEVOTHYROXINE SODIUM 50 MCG PO TABS
100.0000 ug | ORAL_TABLET | Freq: Every day | ORAL | Status: DC
  Administered 2017-07-14: 100 ug via ORAL
  Filled 2017-07-13: qty 2

## 2017-07-13 MED ORDER — ALUM & MAG HYDROXIDE-SIMETH 200-200-20 MG/5ML PO SUSP: 30.0000 mL | ORAL | Status: DC | PRN

## 2017-07-13 MED ORDER — FENTANYL CITRATE (PF) 100 MCG/2ML IJ SOLN
25.0000 ug | Freq: Once | INTRAMUSCULAR | Status: AC
Start: 2017-07-13 — End: 2017-07-13
  Administered 2017-07-13: 25 ug via INTRAVENOUS

## 2017-07-13 MED ORDER — ACETAMINOPHEN 325 MG PO TABS: 650.0000 mg | ORAL_TABLET | Freq: Four times a day (QID) | ORAL | Status: DC | PRN

## 2017-07-13 MED ORDER — ADENOSINE 12 MG/4ML IV SOLN
12.0000 mg | Freq: Once | INTRAVENOUS | Status: AC
  Administered 2017-07-13: 12 mg via INTRAVENOUS

## 2017-07-13 MED ORDER — IRBESARTAN 150 MG PO TABS
150.0000 mg | ORAL_TABLET | Freq: Every day | ORAL | Status: DC
  Administered 2017-07-14: 150 mg via ORAL
  Filled 2017-07-13: qty 1

## 2017-07-13 MED ORDER — SODIUM CHLORIDE 0.9 % IV SOLN: INTRAVENOUS | Status: DC

## 2017-07-13 MED ORDER — ONDANSETRON HCL 4 MG PO TABS: 4.0000 mg | ORAL_TABLET | Freq: Four times a day (QID) | ORAL | Status: DC | PRN

## 2017-07-13 MED ORDER — SIMVASTATIN 10 MG PO TABS
20.0000 mg | ORAL_TABLET | Freq: Every day | ORAL | Status: DC
  Administered 2017-07-13: 20 mg via ORAL
  Filled 2017-07-13 (×2): qty 2

## 2017-07-13 MED ORDER — DILTIAZEM HCL 25 MG/5ML IV SOLN
INTRAVENOUS | Status: AC
  Administered 2017-07-13: 12 mg via INTRAVENOUS
  Filled 2017-07-13: qty 5

## 2017-07-13 MED ORDER — ETOMIDATE 2 MG/ML IV SOLN
10.0000 mg | Freq: Once | INTRAVENOUS | Status: AC
  Administered 2017-07-13: 10 mg via INTRAVENOUS

## 2017-07-13 MED ORDER — ONDANSETRON HCL 4 MG/2ML IJ SOLN: 4.0000 mg | Freq: Four times a day (QID) | INTRAMUSCULAR | Status: DC | PRN

## 2017-07-13 MED ORDER — ADENOSINE 12 MG/4ML IV SOLN
INTRAVENOUS | Status: AC
  Administered 2017-07-13: 12 mg via INTRAVENOUS
  Filled 2017-07-13: qty 4

## 2017-07-13 NOTE — ED Notes (Signed)
Admitting MD at bedside with pt and family members. RN just assisted pt to bedside commode. Pt ambulates with a walker at home and was able to stand with 1 staff assistance. No dizziness reported and no difficulty noted.

## 2017-07-13 NOTE — Sedation Documentation (Signed)
Vital signs stable. 

## 2017-07-13 NOTE — Sedation Documentation (Signed)
Family updated as to patient's status.

## 2017-07-13 NOTE — ED Provider Notes (Signed)
[facility] Emergency Department Provider Note  ____________________________________________   First MD Initiated Contact with Patient 07/13/17 1533     (approximate)  I have reviewed the triage vital signs and the nursing notes.   HISTORY  Chief Complaint Tachycardia   HPI Holly Clark is a 84 y.o. female with a history of COPD as well as CAD status post CABG who is presenting to the emergency department today with back pain as well as left arm pain over the past 4-6 hours.  The patient however, at this time denies any pain.  However, per EMS, she had rapid heart rate.  Patient received 6 of adenosine in route without any change in the patient's heart rate.  Patient does not report any nausea, vomiting or shortness of breath at this time.  Recent hospital admission for COPD and discharged just this past Tuesday.  Past Medical History:  Diagnosis Date  . COPD (chronic obstructive pulmonary disease) ([place])   . Coronary artery disease   . Dementia   . Depression   . Hypothyroidism     Patient Active Problem List   Diagnosis Date Noted  . Esophagitis, erosive   . Iron deficiency anemia due to chronic blood loss   . [place] lesion, chronic   . Large hiatal hernia   . COPD exacerbation ([place]) 07/07/2017  . Hip fracture, right ([place]) 05/18/2015    Past Surgical History:  Procedure Laterality Date  . ABDOMINAL HYSTERECTOMY    . CARDIAC SURGERY    . CATARACT EXTRACTION Bilateral 2012  . CORONARY ARTERY BYPASS GRAFT    . ESOPHAGOGASTRODUODENOSCOPY N/A 07/09/2017   Procedure: ESOPHAGOGASTRODUODENOSCOPY (EGD);  Surgeon: Lin Landsman, MD;  Location: [facility] ENDOSCOPY;  Service: Gastroenterology;  Laterality: N/A;  . [place]   patient unsure of which side  . HIP PINNING,CANNULATED Right 05/19/2015   Procedure: CANNULATED HIP PINNING;  Surgeon: Hessie Knows, MD;  Location: [facility] ORS;  Service: Orthopedics;  Laterality: Right;    Prior  to Admission medications   Medication Sig Start Date End Date Taking? Authorizing Provider  albuterol (PROVENTIL HFA;VENTOLIN HFA) 108 (90 BASE) MCG/ACT inhaler Inhale 2 puffs into the lungs every 6 (six) hours as needed for wheezing or shortness of breath. 05/22/15  Yes Epifanio Lesches, MD  alum & mag hydroxide-simeth (MAALOX/MYLANTA) 200-200-20 MG/5ML suspension Take 30 mLs by mouth every 4 (four) hours as needed for indigestion. 05/22/15  Yes Epifanio Lesches, MD  aspirin EC 81 MG tablet Take 81 mg by mouth daily.   Yes [provider]  buPROPion (WELLBUTRIN) 75 MG tablet Take 75 mg by mouth daily.   Yes [provider]  diltiazem (DILACOR XR) 120 MG 24 hr capsule Take 120 mg by mouth daily.   Yes [provider]  docusate sodium (COLACE) 100 MG capsule Take 1 capsule (100 mg total) by mouth 2 (two) times daily. 05/22/15  Yes Epifanio Lesches, MD  donepezil (ARICEPT) 5 MG tablet Take 5 mg by mouth at bedtime.   Yes [provider]  ferrous sulfate 325 (65 FE) MG tablet Take 1 tablet (325 mg total) by mouth 2 (two) times daily with a meal. 07/10/17  Yes Vaughan Basta, MD  galantamine (RAZADYNE) 8 MG tablet Take 8 mg by mouth every morning.   Yes [provider]  levalbuterol (XOPENEX) 1.25 MG/3ML nebulizer solution Take 1.25 mg by nebulization every 6 (six) hours as needed for wheezing.   Yes [provider]  levothyroxine (  SYNTHROID, LEVOTHROID) 100 MCG tablet Take 100 mcg by mouth daily before breakfast.   Yes [provider]  metoprolol tartrate (LOPRESSOR) 25 MG tablet Take 0.5 tablets (12.5 mg total) by mouth 2 (two) times daily. 07/10/17  Yes Vaughan Basta, MD  nitroGLYCERIN (NITROSTAT) 0.4 MG SL tablet Place 0.4 mg under the tongue every 5 (five) minutes as needed for chest pain.   Yes [provider]  oral electrolytes (THERMOTABS) TABS tablet Take 1 tablet by mouth.   Yes [provider]  pantoprazole (PROTONIX) 40 MG tablet Take 1 tablet (40 mg total) by mouth 2 (two) times daily. 07/10/17  Yes Vaughan Basta, MD  predniSONE (STERAPRED UNI-PAK 21 TAB) 10 MG (21) TBPK tablet Take 6 tabs first day, 5 tab on day 2, then 4 on day 3rd, 3 tabs on day 4th , 2 tab on day 5th, and 1 tab on 6th day. 07/10/17  Yes Vaughan Basta, MD  sertraline (ZOLOFT) 50 MG tablet Take 50 mg by mouth daily.   Yes [provider]  simvastatin (ZOCOR) 20 MG tablet Take 20 mg by mouth at bedtime.   Yes [provider]  telmisartan (MICARDIS) 40 MG tablet Take 40 mg by mouth daily.   Yes [provider]  Umeclidinium-Vilanterol (ANORO ELLIPTA) 62.5-25 MCG/INH AEPB Inhale 1 puff into the lungs daily.   Yes [provider]  HYDROcodone-acetaminophen (NORCO/VICODIN) 5-325 MG tablet Take 1-2 tablets by mouth every 6 (six) hours as needed for moderate pain. Patient not taking: Reported on 07/07/2017 05/22/15   Epifanio Lesches, MD  magnesium hydroxide (MILK OF MAGNESIA) 400 MG/5ML suspension Take 30 mLs by mouth daily as needed for mild constipation. Patient not taking: Reported on 07/07/2017 05/22/15   Epifanio Lesches, MD    Allergies Patient has no allergy information on record.  Family History  Problem Relation Age of Onset  . Stroke Mother     Social History Social History   Tobacco Use  . Smoking status: Current Every Day Smoker    Packs/day: 0.25    Types: Cigarettes  . Smokeless tobacco: Never Used  Substance Use Topics  . Alcohol use: Yes    Comment: rare, glass of wine occasionally  . Drug use: No    Review of Systems  Constitutional: No fever/chills Eyes: No visual changes. ENT: No sore throat. Cardiovascular: Denies chest pain. Respiratory: Denies shortness of breath. Gastrointestinal: No abdominal pain.  No nausea, no vomiting.  No diarrhea.  No constipation. Genitourinary: Negative for dysuria. Musculoskeletal: As  above Skin: Negative for rash. Neurological: Negative for headaches, focal weakness or numbness.   ____________________________________________   PHYSICAL EXAM:  VITAL SIGNS: ED Triage Vitals  Enc Vitals Group     BP 07/13/17 1522 (!) 122/92     Pulse Rate 07/13/17 1500 (!) 166     Resp 07/13/17 1500 (!) 35     Temp 07/13/17 1500 98.2 F (36.8 C)     Temp Source 07/13/17 1500 Oral     SpO2 07/13/17 1500 99 %     Weight 07/13/17 1527 95 lb (43.1 kg)     Height 07/13/17 1527 5\' 1"  (1.549 m)     Head Circumference --      Peak Flow --      Pain Score --      Pain Loc --      Pain Edu? --      Excl. in [place]? --     Constitutional: Alert and oriented. in  no acute distress. Eyes: Conjunctivae are normal.  Head: Atraumatic. Nose: No congestion/rhinnorhea. Mouth/Throat: Mucous membranes are moist.  Neck: No stridor.   Cardiovascular: Tachycardic, regular rhythm. Grossly normal heart sounds.   Respiratory: Normal respiratory effort.  No retractions. Lungs CTAB. Gastrointestinal: Soft and nontender. No distention.  Musculoskeletal: No lower extremity tenderness nor edema.  No joint effusions. Neurologic:  Normal speech and language. No gross focal neurologic deficits are appreciated. Skin:  Skin is warm, dry and intact. No rash noted. Psychiatric: Mood and affect are normal. Speech and behavior are normal.  ____________________________________________   LABS (all labs ordered are listed, but only abnormal results are displayed)  Labs Reviewed  CBC WITH DIFFERENTIAL/PLATELET - Abnormal; Notable for the following components:      Result Value   WBC 15.5 (*)    RDW 15.2 (*)    All other components within normal limits  BASIC METABOLIC PANEL - Abnormal; Notable for the following components:   Glucose, Bld 256 (*)    BUN 35 (*)    Creatinine, Ser 1.55 (*)    GFR calc non Af Amer 29 (*)    GFR calc Af Amer 34 (*)    All other components within normal limits  TROPONIN I -  Abnormal; Notable for the following components:   Troponin I 0.29 (*)    All other components within normal limits   ____________________________________________  EKG  ED ECG REPORT I, Doran Stabler, the attending physician, personally viewed and interpreted this ECG.   Date: 07/13/2017  EKG Time: 1501  Rate: 162  Rhythm: 162  Axis: normal  Intervals:right bundle branch block  ST&T Change: ST elevation in aVR with depressions diffusely which are likely rate related.  T wave inversions in V2 as well as V3.  ED ECG REPORT I, Doran Stabler, the attending physician, personally viewed and interpreted this ECG.   Date: 07/13/2017  EKG Time: 1613  Rate: 148  Rhythm: SVT  Axis: Normal  Intervals:right bundle branch block  ST&T Change: Elevated ST segments in aVR.  Otherwise, continues to have diffuse ST depressions consistent with demand  Rhythm strip continues with SVT without any other change.  At 1521.  ED ECG REPORT I, Doran Stabler, the attending physician, personally viewed and interpreted this ECG.   Date: 07/13/2017  EKG Time: 1653  Rate: 99  Rhythm: sinus rhythm with pvcs.    Axis: normal  Intervals:none  ST&T Change: no st segment elevation or depression.  t wave in version in v2.      ____________________________________________  RADIOLOGY  No active cardia pulmonary disease ____________________________________________   PROCEDURES  Procedure(s) performed:   .Critical Care Performed by: Orbie Pyo, MD Authorized by: Orbie Pyo, MD   Critical care provider statement:    Critical care time (minutes):  35   Critical care time was exclusive of:  Separately billable procedures and treating other patients   Critical care was necessary to treat or prevent imminent or life-threatening deterioration of the following conditions:  Cardiac failure and circulatory failure   Critical care was time spent personally by me on  the following activities:  Discussions with consultants, ordering and performing treatments and interventions, ordering and review of laboratory studies, ordering and review of radiographic studies and re-evaluation of patient's condition .Cardioversion Date/Time: 07/13/2017 5:13 PM Performed by: Orbie Pyo, MD Authorized by: Orbie Pyo, MD   Consent:    Consent obtained:  Written   Consent given by:  Healthcare agent   Risks discussed:  Death, induced arrhythmia and pain   Alternatives discussed:  No treatment, rate-control medication, alternative treatment and delayed treatment Pre-procedure details:    Cardioversion basis:  Emergent   Rhythm:  Supraventricular tachycardia   Electrode placement:  Anterior-posterior Patient sedated: Yes. Refer to sedation procedure documentation for details of sedation.  Attempt one:    Cardioversion mode:  Synchronous   Waveform:  Biphasic   Shock (Joules):  75   Shock outcome:  Conversion to normal sinus rhythm Post-procedure details:    Patient status:  Awake   Patient tolerance of procedure:  Tolerated well, no immediate complications Comments:     Patient initially given 6 of adenosine in route without resolution.  Then given 12 adenosine here at the hospital.  Adenosine was not effective.  We then give the patient an initial dose of 12 mg of Cardizem.  However, the patient started to become hypotensive.  She was started on fluids were able to get the blood pressure up to 937 systolic.  At that point, the patient was given an additional 10 mg of Cardizem.  However, the patient's heart rate was still rapid in the 150s and regular.  I discussed the case with Dr. Ubaldo Glassing, the patient's cardiologist, given that it appeared that the patient was having flutter waves every several seconds.  However, would quickly go back into the SVT rhythm.  Because the patient now having 4 doses of antiarrhythmic medications and her blood pressure  becoming softer, we decided to proceed with cardioversion.  Family was explained the risks and benefits.  The patient has dementia and was not understanding well the algorithm and need for the cardioversion so I asked the family to step outside the room so we could discuss.  They are in agreement with the course of action.    .Sedation Date/Time: 07/13/2017 5:16 PM Performed by: Orbie Pyo, MD Authorized by: Orbie Pyo, MD   Consent:    Consent obtained:  Written (electronic informed consent)   Consent given by:  Healthcare agent   Risks discussed:  Allergic reaction, dysrhythmia, inadequate sedation, nausea, vomiting, respiratory compromise necessitating ventilatory assistance and intubation, prolonged sedation necessitating reversal and prolonged hypoxia resulting in organ damage   Alternatives discussed:  Analgesia without sedation Universal protocol:    Procedure explained and questions answered to patient or proxy's satisfaction: yes     Relevant documents present and verified: yes     Test results available and properly labeled: yes     Imaging studies available: yes     Required blood products, implants, devices, and special equipment available: yes     Immediately prior to procedure a time out was called: yes     Patient identity confirmation method:  Arm band Indications:    Procedure performed:  Cardioversion   Procedure necessitating sedation performed by:  Physician performing sedation   Intended level of sedation:  Moderate (conscious sedation) Pre-sedation assessment:    Time since last food or drink:  Unknown.    NPO status caution: unable to specify NPO status     ASA classification: class 2 - patient with mild systemic disease     Neck mobility: normal     Mouth opening:  3 or more finger widths   Mallampati score:  III - soft palate, base of uvula visible   Pre-sedation assessments completed and reviewed: airway patency, cardiovascular  function, mental status and respiratory function   Immediate pre-procedure details:  Reassessment: Patient reassessed immediately prior to procedure     Reviewed: vital signs, relevant labs/tests and NPO status     Verified: bag valve mask available, emergency equipment available, intubation equipment available, IV patency confirmed, oxygen available, reversal medications available and suction available   Procedure details (see MAR for exact dosages):    Preoxygenation:  Nasal cannula   Sedation:  Etomidate   Analgesia:  Fentanyl   Intra-procedure monitoring:  Blood pressure monitoring, continuous pulse oximetry, cardiac monitor, frequent vital sign checks and frequent LOC assessments   Intra-procedure events: none     Total Provider sedation time (minutes):  15 Post-procedure details:    Post-sedation assessment completed:  07/13/2017 5:19 PM   Attendance: Constant attendance by certified staff until patient recovered     Recovery: Patient returned to pre-procedure baseline     Post-sedation assessments completed and reviewed: airway patency, cardiovascular function, hydration status, mental status and respiratory function     Patient is stable for discharge or admission: yes     Patient tolerance:  Tolerated well, no immediate complications     Critical Care performed:    ____________________________________________   INITIAL IMPRESSION / ASSESSMENT AND PLAN / ED COURSE  Pertinent labs & imaging results that were available during my care of the patient were reviewed by me and considered in my medical decision making (see chart for details).  Differential diagnosis includes, but is not limited to, ACS, aortic dissection, pulmonary embolism, cardiac tamponade, pneumothorax, pneumonia, pericarditis, myocarditis, GI-related causes including esophagitis/gastritis, and musculoskeletal chest wall pain.   As part of my medical decision making, I reviewed the following data within the  [facility] chart reviewed and Notes from prior ED visits  ----------------------------------------- 5:20 PM on 07/13/2017 -----------------------------------------  Patient at this time awake and alert.  No complaints.  Appears to be in a sinus rhythm that is irregular.  Patient with elevated troponin but appears to be chronically elevated.  Patient will be admitted to the hospital for further observation.  Signed out to Dr. Bridgett Larsson.  The patient as well as family is understanding of this plan and willing to comply.  Feel the back pain in the left arm pain were likely anginal equivalents.  Patient denies any pain at this time.      ____________________________________________   FINAL CLINICAL IMPRESSION(S) / ED DIAGNOSES  SVT.  Back pain.    NEW MEDICATIONS STARTED DURING THIS VISIT:  New Prescriptions   No medications on file     Note:  This document was prepared using Dragon voice recognition software and may include unintentional dictation errors.     Orbie Pyo, MD 07/13/17 [phone]

## 2017-07-13 NOTE — ED Triage Notes (Addendum)
Pt to ED from home with tachycardia. Pt called EMS to report back pain and left arm pain. No CP and no SOB reported.Pt confused with hx of dementia.   EMS reports having given 6 of Adenosine without change. PT is answering questions and alert at this time. BP stable.

## 2017-07-13 NOTE — ED Notes (Signed)
Pt awake and talking   md in with pt and family.  Sinus on monitor with occ pvc.  Oxygen in place.  Pt waiting on admission.  Family aware.

## 2017-07-13 NOTE — ED Notes (Signed)
Pt sleeping.  Sinus with occ pvc.  Oxygen via [place] in pace.  Family with pt.  Iv fluids infusing.

## 2017-07-13 NOTE — H&P (Signed)
[facility] NAME: Holly Clark    MR#:  [id number]  DATE OF BIRTH:  [birth year]  DATE OF ADMISSION:  07/13/2017  PRIMARY CARE PHYSICIAN: Rusty Aus, MD   REQUESTING/REFERRING PHYSICIAN: Orbie Pyo, MD  CHIEF COMPLAINT:   Chief Complaint  Patient presents with  . Tachycardia   Tachycardia. HISTORY OF PRESENT ILLNESS:  Holly Clark  is a 84 y.o. female with a known history of COPD, CAD, dementia, depression and hypothyroidism.  The patient presented to ED with back pain and left arm pain over the past hours.  She denies any symptoms in the ED.  She was found tachycardia, given 6 of adenosine without improvement.  She is also treated with Cardizem IV.  Heart rate is better controlled now.  ED physician requested observation overnight.  PAST MEDICAL HISTORY:   Past Medical History:  Diagnosis Date  . COPD (chronic obstructive pulmonary disease) ([place])   . Coronary artery disease   . Dementia   . Depression   . Hypothyroidism     PAST SURGICAL HISTORY:   Past Surgical History:  Procedure Laterality Date  . ABDOMINAL HYSTERECTOMY    . CARDIAC SURGERY    . CATARACT EXTRACTION Bilateral 2012  . CORONARY ARTERY BYPASS GRAFT    . ESOPHAGOGASTRODUODENOSCOPY N/A 07/09/2017   Procedure: ESOPHAGOGASTRODUODENOSCOPY (EGD);  Surgeon: Lin Landsman, MD;  Location: [facility] ENDOSCOPY;  Service: Gastroenterology;  Laterality: N/A;  . [place]   patient unsure of which side  . HIP PINNING,CANNULATED Right 05/19/2015   Procedure: CANNULATED HIP PINNING;  Surgeon: Hessie Knows, MD;  Location: [facility] ORS;  Service: Orthopedics;  Laterality: Right;    SOCIAL HISTORY:   Social History   Tobacco Use  . Smoking status: Current Every Day Smoker    Packs/day: 0.25    Types: Cigarettes  . Smokeless tobacco: Never Used  Substance Use Topics  . Alcohol use: Yes    Comment: rare, glass of wine occasionally     FAMILY HISTORY:   Family History  Problem Relation Age of Onset  . Stroke Mother     DRUG ALLERGIES:  Not on File  REVIEW OF SYSTEMS:   Review of Systems  Constitutional: Negative for chills, fever and malaise/fatigue.  HENT: Negative for sore throat.   Eyes: Negative for blurred vision and double vision.  Respiratory: Negative for cough, hemoptysis, shortness of breath, wheezing and stridor.   Cardiovascular: Negative for chest pain, palpitations, orthopnea and leg swelling.  Gastrointestinal: Negative for abdominal pain, blood in stool, diarrhea, melena, nausea and vomiting.  Genitourinary: Negative for dysuria, flank pain and hematuria.  Musculoskeletal: Negative for back pain and joint pain.  Neurological: Negative for dizziness, sensory change, focal weakness, seizures, loss of consciousness, weakness and headaches.  Endo/Heme/Allergies: Negative for polydipsia.  Psychiatric/Behavioral: Negative for depression. The patient is not nervous/anxious.     MEDICATIONS AT HOME:   Prior to Admission medications   Medication Sig Start Date End Date Taking? Authorizing Provider  albuterol (PROVENTIL HFA;VENTOLIN HFA) 108 (90 BASE) MCG/ACT inhaler Inhale 2 puffs into the lungs every 6 (six) hours as needed for wheezing or shortness of breath. 05/22/15  Yes Epifanio Lesches, MD  alum & mag hydroxide-simeth (MAALOX/MYLANTA) 200-200-20 MG/5ML suspension Take 30 mLs by mouth every 4 (four) hours as needed for indigestion. 05/22/15  Yes Epifanio Lesches, MD  aspirin EC 81 MG tablet Take 81 mg by mouth daily.   Yes  [provider]  buPROPion (WELLBUTRIN) 75 MG tablet Take 75 mg by mouth daily.   Yes [provider]  diltiazem (DILACOR XR) 120 MG 24 hr capsule Take 120 mg by mouth daily.   Yes [provider]  docusate sodium (COLACE) 100 MG capsule Take 1 capsule (100 mg total) by mouth 2 (two) times daily. 05/22/15  Yes Epifanio Lesches, MD    donepezil (ARICEPT) 5 MG tablet Take 5 mg by mouth at bedtime.   Yes [provider]  ferrous sulfate 325 (65 FE) MG tablet Take 1 tablet (325 mg total) by mouth 2 (two) times daily with a meal. 07/10/17  Yes Vaughan Basta, MD  galantamine (RAZADYNE) 8 MG tablet Take 8 mg by mouth every morning.   Yes [provider]  levalbuterol (XOPENEX) 1.25 MG/3ML nebulizer solution Take 1.25 mg by nebulization every 6 (six) hours as needed for wheezing.   Yes [provider]  levothyroxine (SYNTHROID, LEVOTHROID) 100 MCG tablet Take 100 mcg by mouth daily before breakfast.   Yes [provider]  metoprolol tartrate (LOPRESSOR) 25 MG tablet Take 0.5 tablets (12.5 mg total) by mouth 2 (two) times daily. 07/10/17  Yes Vaughan Basta, MD  nitroGLYCERIN (NITROSTAT) 0.4 MG SL tablet Place 0.4 mg under the tongue every 5 (five) minutes as needed for chest pain.   Yes [provider]  oral electrolytes (THERMOTABS) TABS tablet Take 1 tablet by mouth.   Yes [provider]  pantoprazole (PROTONIX) 40 MG tablet Take 1 tablet (40 mg total) by mouth 2 (two) times daily. 07/10/17  Yes Vaughan Basta, MD  predniSONE (STERAPRED UNI-PAK 21 TAB) 10 MG (21) TBPK tablet Take 6 tabs first day, 5 tab on day 2, then 4 on day 3rd, 3 tabs on day 4th , 2 tab on day 5th, and 1 tab on 6th day. 07/10/17  Yes Vaughan Basta, MD  sertraline (ZOLOFT) 50 MG tablet Take 50 mg by mouth daily.   Yes [provider]  simvastatin (ZOCOR) 20 MG tablet Take 20 mg by mouth at bedtime.   Yes [provider]  telmisartan (MICARDIS) 40 MG tablet Take 40 mg by mouth daily.   Yes [provider]  Umeclidinium-Vilanterol (ANORO ELLIPTA) 62.5-25 MCG/INH AEPB Inhale 1 puff into the lungs daily.   Yes [provider]  HYDROcodone-acetaminophen (NORCO/VICODIN) 5-325 MG tablet Take 1-2 tablets by mouth every 6 (six) hours as needed for moderate  pain. Patient not taking: Reported on 07/07/2017 05/22/15   Epifanio Lesches, MD  magnesium hydroxide (MILK OF MAGNESIA) 400 MG/5ML suspension Take 30 mLs by mouth daily as needed for mild constipation. Patient not taking: Reported on 07/07/2017 05/22/15   Epifanio Lesches, MD      VITAL SIGNS:  Blood pressure (!) 142/63, pulse 71, temperature 97.7 F (36.5 C), temperature source Oral, resp. rate 20, height 5\' 1"  (1.549 m), weight 95 lb (43.1 kg), SpO2 100 %.  PHYSICAL EXAMINATION:  Physical Exam  GENERAL:  84 y.o.-year-old patient lying in the bed with no acute distress.  EYES: Pupils equal, round, reactive to light and accommodation. No scleral icterus. Extraocular muscles intact.  HEENT: Head atraumatic, normocephalic. Oropharynx and nasopharynx clear.  NECK:  Supple, no jugular venous distention. No thyroid enlargement, no tenderness.  LUNGS: Mildly diminished breath sounds bilaterally, no wheezing, rales,rhonchi or crepitation. No use of accessory muscles of respiration.  CARDIOVASCULAR: S1, S2 normal. No murmurs, rubs, or gallops.  ABDOMEN: Soft, nontender, nondistended. Bowel sounds present. No  organomegaly or mass.  EXTREMITIES: No pedal edema, cyanosis, or clubbing.  NEUROLOGIC: Cranial nerves II through XII are intact. Muscle strength 5/5 in all extremities. Sensation intact. Gait not checked.  PSYCHIATRIC: The patient is alert and oriented x 3.  SKIN: No obvious rash, lesion, or ulcer.   LABORATORY PANEL:   CBC Recent Labs  Lab 07/13/17 1505  WBC 15.5*  HGB 12.4  HCT 38.4  PLT 380   ------------------------------------------------------------------------------------------------------------------  Chemistries  Recent Labs  Lab 07/13/17 1505  NA 140  K 4.0  CL 105  CO2 25  GLUCOSE 256*  BUN 35*  CREATININE 1.55*  CALCIUM 9.0   ------------------------------------------------------------------------------------------------------------------  Cardiac  Enzymes Recent Labs  Lab 07/13/17 1505  TROPONINI 0.29*   ------------------------------------------------------------------------------------------------------------------  RADIOLOGY:  Dg Chest 1 View  Result Date: 07/13/2017 CLINICAL DATA:  Tachycardia and back pain today EXAM: CHEST 1 VIEW COMPARISON:  July 10, 2017 FINDINGS: The heart size and mediastinal contours are stable. Patient status post prior CABG and median sternotomy. Both lungs are clear. The lungs are hyperinflated. There is probably a hiatal hernia. The visualized skeletal structures are stable. IMPRESSION: No active cardiopulmonary disease. Electronically Signed   By: Abelardo Diesel M.D.   On: 07/13/2017 16:08      IMPRESSION AND PLAN:   Sinus tachycardia, unclear etiology. The patient will be placed for observation, telemetry monitor and cardiology consult.  Continue Lopressor and Cardizem.  Chronic elevated troponin.  Follow-up cardiology consult.  Acute renal failure on CKD stage III.  Gentle rehydration and follow-up BMP. Leukocytosis.  Unclear etiology, follow-up CBC and urinalysis.   All the records are reviewed and case discussed with ED provider. Management plans discussed with the patient, her son and 2 daughters. And they are in agreement.  CODE STATUS: Full code  TOTAL TIME TAKING CARE OF THIS PATIENT: 53 minutes.    Demetrios Loll M.D on 07/13/2017 at 7:10 PM  Between 7am to 6pm - Pager - [phone]  After 6pm go to [email or web address] - Proofreader  [facility]  Office  [phone]  CC: Primary care physician; Rusty Aus, MD   Note: This dictation was prepared with Dragon dictation along with smaller phrase technology. Any transcriptional errors that result from this process are unin

## 2017-07-13 NOTE — ED Notes (Signed)
Family and MD at bedside talking about options. Pt remains alert.

## 2017-07-13 NOTE — ED Notes (Signed)
NO change in rate from medication administration.

## 2017-07-14 ENCOUNTER — Other Ambulatory Visit: Payer: Self-pay

## 2017-07-14 DIAGNOSIS — R748 Abnormal levels of other serum enzymes: Secondary | ICD-10-CM | POA: Diagnosis not present

## 2017-07-14 DIAGNOSIS — N179 Acute kidney failure, unspecified: Secondary | ICD-10-CM | POA: Diagnosis not present

## 2017-07-14 DIAGNOSIS — D72829 Elevated white blood cell count, unspecified: Secondary | ICD-10-CM | POA: Diagnosis not present

## 2017-07-14 DIAGNOSIS — R000 Tachycardia, unspecified: Secondary | ICD-10-CM | POA: Diagnosis not present

## 2017-07-14 DIAGNOSIS — I480 Paroxysmal atrial fibrillation: Secondary | ICD-10-CM | POA: Diagnosis not present

## 2017-07-14 LAB — CBC
HEMATOCRIT: 30.4 % — AB (ref 35.0–47.0)
HEMOGLOBIN: 10 g/dL — AB (ref 12.0–16.0)
MCH: 29.3 pg (ref 26.0–34.0)
MCHC: 32.8 g/dL (ref 32.0–36.0)
MCV: 89.5 fL (ref 80.0–100.0)
Platelets: 295 10*3/uL (ref 150–440)
RBC: 3.4 MIL/uL — ABNORMAL LOW (ref 3.80–5.20)
RDW: 15 % — ABNORMAL HIGH (ref 11.5–14.5)
WBC: 9.7 10*3/uL (ref 3.6–11.0)

## 2017-07-14 LAB — BASIC METABOLIC PANEL
ANION GAP: 4 — AB (ref 5–15)
BUN: 26 mg/dL — ABNORMAL HIGH (ref 6–20)
CHLORIDE: 112 mmol/L — AB (ref 101–111)
CO2: 22 mmol/L (ref 22–32)
Calcium: 7.8 mg/dL — ABNORMAL LOW (ref 8.9–10.3)
Creatinine, Ser: 1.19 mg/dL — ABNORMAL HIGH (ref 0.44–1.00)
GFR calc Af Amer: 47 mL/min — ABNORMAL LOW (ref >60)
GFR, EST NON AFRICAN AMERICAN: 40 mL/min — AB (ref >60)
GLUCOSE: 118 mg/dL — AB (ref 65–99)
POTASSIUM: 3.7 mmol/L (ref 3.5–5.1)
Sodium: 138 mmol/L (ref 135–145)

## 2017-07-14 LAB — PATHOLOGIST SMEAR REVIEW

## 2017-07-14 MED ORDER — SODIUM CHLORIDE 0.9% FLUSH: 3.0000 mL | Freq: Two times a day (BID) | INTRAVENOUS | Status: DC

## 2017-07-14 MED ORDER — MORPHINE SULFATE (PF) 2 MG/ML IV SOLN
0.5000 mg | INTRAVENOUS | Status: DC | PRN
  Filled 2017-07-14: qty 1

## 2017-07-14 MED ORDER — ALPRAZOLAM 0.25 MG PO TABS
0.2500 mg | ORAL_TABLET | Freq: Three times a day (TID) | ORAL | Status: DC | PRN
  Administered 2017-07-14: 0.25 mg via ORAL
  Filled 2017-07-14: qty 1

## 2017-07-14 MED ORDER — ALPRAZOLAM 0.25 MG PO TABS: 0.2500 mg | ORAL_TABLET | Freq: Three times a day (TID) | ORAL | 0 refills | Status: DC | PRN

## 2017-07-14 MED ORDER — IPRATROPIUM-ALBUTEROL 0.5-2.5 (3) MG/3ML IN SOLN: 3.0000 mL | RESPIRATORY_TRACT | Status: DC

## 2017-07-14 MED ORDER — LEVOTHYROXINE SODIUM 75 MCG PO TABS: 75.0000 ug | ORAL_TABLET | Freq: Every day | ORAL | 0 refills | Status: DC

## 2017-07-14 NOTE — Care Management Obs Status (Signed)
[facility] NOTIFICATION   Patient Details  Name: Holly Clark MRN: [id number] Date of Birth: [birth year]   Medicare Observation Status Notification Given:  No Discharge order placed in < 24hr of being placed on observation    Katrina Stack, RN 07/14/2017, 3:49 PM

## 2017-07-14 NOTE — Care Management (Signed)
Patient placed in observation for tachycardia.  She had a previous discharge 2/5 for copd exacerbation.  Attending information CM that patient was suppose to have been seen by home health but no one has ever called back.  Confirmed this with patient and her son.  Review of medical record shows that referral was called to [facility].  Sarah with [facility] says that she informed the CM that agency could not accept the referral due to staffing issues.  Spoke with patient and son.  Agency preference [facility].  Referral called and accepted.  Initial visits within 24 hours by therapist and nurse to see patient on Sunday.

## 2017-07-14 NOTE — Consult Note (Addendum)
[facility] Cardiology  CARDIOLOGY CONSULT NOTE  Patient ID: Holly Clark MRN: [id number] DOB/AGE: 02-27-1930 84 y.o.  Admit date: 07/13/2017 Referring Physician  Primary Physician  Primary Cardiologist Dr. Bartholome Bill Reason for Consultation SVT, post cardioversion   HPI: Holly Clark is an 84 year old female with a history of CAD s/p CABG in 1985, COPD who presented to the ED on 2/7 for back pain and left arm pain. EMS noted a rapid heart rate in which she received 6mg  of adenosine, followed by 12mg  of adenosine with no improvement of tachycardia. She was given 12 mg of Cardizem in which she became hypotensive. EKG revealed frequent flutter waves and SVT. EMS proceeded with cardioversion and was admitted. Today, patient is sitting up in bed, comfortable. She is demented and unsure of why she came into the ED. She denies chest pain, shortness of breath, palpitations, peripheral edema. Also denies N/V/D.   Patient was recently admitted for COPD exacerbation. While admitted, underwent echo on 07/07/17 which showed normal left ventricular function with EF of 55-65%; moderate LVH; mild MR, moderate TR  Patient is followed by Dr. Ubaldo Glassing at [place], but hasn't been seen in the clinic since 2016.   Review of systems complete and found to be negative unless listed above     Past Medical History:  Diagnosis Date  . COPD (chronic obstructive pulmonary disease) ([place])   . Coronary artery disease   . Dementia   . Depression   . Hypothyroidism     Past Surgical History:  Procedure Laterality Date  . ABDOMINAL HYSTERECTOMY    . CARDIAC SURGERY    . CATARACT EXTRACTION Bilateral 2012  . CORONARY ARTERY BYPASS GRAFT    . ESOPHAGOGASTRODUODENOSCOPY N/A 07/09/2017   Procedure: ESOPHAGOGASTRODUODENOSCOPY (EGD);  Surgeon: Lin Landsman, MD;  Location: [facility] ENDOSCOPY;  Service: Gastroenterology;  Laterality: N/A;  . [place]   patient unsure of which side  . HIP PINNING,CANNULATED  Right 05/19/2015   Procedure: CANNULATED HIP PINNING;  Surgeon: Hessie Knows, MD;  Location: [facility] ORS;  Service: Orthopedics;  Laterality: Right;    Medications Prior to Admission  Medication Sig Dispense Refill Last Dose  . albuterol (PROVENTIL HFA;VENTOLIN HFA) 108 (90 BASE) MCG/ACT inhaler Inhale 2 puffs into the lungs every 6 (six) hours as needed for wheezing or shortness of breath. 1 Inhaler 2 PRN at PRN  . alum & mag hydroxide-simeth (MAALOX/MYLANTA) 200-200-20 MG/5ML suspension Take 30 mLs by mouth every 4 (four) hours as needed for indigestion. 355 mL 0 PRN at PRN  . aspirin EC 81 MG tablet Take 81 mg by mouth daily.   07/13/2017 at AM  . buPROPion (WELLBUTRIN) 75 MG tablet Take 75 mg by mouth daily.   07/13/2017 at AM  . diltiazem (DILACOR XR) 120 MG 24 hr capsule Take 120 mg by mouth daily.   07/13/2017 at AM  . docusate sodium (COLACE) 100 MG capsule Take 1 capsule (100 mg total) by mouth 2 (two) times daily. 10 capsule 0 07/13/2017 at AM  . donepezil (ARICEPT) 5 MG tablet Take 5 mg by mouth at bedtime.   07/12/2017 at HS  . ferrous sulfate 325 (65 FE) MG tablet Take 1 tablet (325 mg total) by mouth 2 (two) times daily with a meal. 60 tablet 2 07/13/2017 at AM  . galantamine (RAZADYNE) 8 MG tablet Take 8 mg by mouth every morning.   07/13/2017 at AM  . levalbuterol (XOPENEX) 1.25 MG/3ML nebulizer solution Take 1.25 mg  by nebulization every 6 (six) hours as needed for wheezing.   PRN at PRN  . levothyroxine (SYNTHROID, LEVOTHROID) 100 MCG tablet Take 100 mcg by mouth daily before breakfast.   07/13/2017 at AM  . metoprolol tartrate (LOPRESSOR) 25 MG tablet Take 0.5 tablets (12.5 mg total) by mouth 2 (two) times daily. 60 tablet 0 07/13/2017 at AM  . nitroGLYCERIN (NITROSTAT) 0.4 MG SL tablet Place 0.4 mg under the tongue every 5 (five) minutes as needed for chest pain.   PRN at PRN  . oral electrolytes (THERMOTABS) TABS tablet Take 1 tablet by mouth.   UTD at UTD  . pantoprazole (PROTONIX) 40 MG tablet  Take 1 tablet (40 mg total) by mouth 2 (two) times daily. 60 tablet 0 07/13/2017 at AM  . predniSONE (STERAPRED UNI-PAK 21 TAB) 10 MG (21) TBPK tablet Take 6 tabs first day, 5 tab on day 2, then 4 on day 3rd, 3 tabs on day 4th , 2 tab on day 5th, and 1 tab on 6th day. 21 tablet 0 07/13/2017 at AM  . sertraline (ZOLOFT) 50 MG tablet Take 50 mg by mouth daily.   07/13/2017 at AM  . simvastatin (ZOCOR) 20 MG tablet Take 20 mg by mouth at bedtime.   07/12/2017 at PM  . telmisartan (MICARDIS) 40 MG tablet Take 40 mg by mouth daily.   07/13/2017 at AM  . Umeclidinium-Vilanterol (ANORO ELLIPTA) 62.5-25 MCG/INH AEPB Inhale 1 puff into the lungs daily.   07/13/2017 at AM  . HYDROcodone-acetaminophen (NORCO/VICODIN) 5-325 MG tablet Take 1-2 tablets by mouth every 6 (six) hours as needed for moderate pain. (Patient not taking: Reported on 07/07/2017) 30 tablet 0 -- at --  . magnesium hydroxide (MILK OF MAGNESIA) 400 MG/5ML suspension Take 30 mLs by mouth daily as needed for mild constipation. (Patient not taking: Reported on 07/07/2017) 360 mL 0 -- at --   Social History   Socioeconomic History  . Marital status: Divorced    Spouse name: Not on file  . Number of children: Not on file  . Years of education: Not on file  . Highest education level: Not on file  Social Needs  . Financial resource strain: Not on file  . Food insecurity - worry: Not on file  . Food insecurity - inability: Not on file  . Transportation needs - medical: Not on file  . Transportation needs - non-medical: Not on file  Occupational History  . Occupation: retired  Tobacco Use  . Smoking status: Current Every Day Smoker    Packs/day: 0.25    Types: Cigarettes  . Smokeless tobacco: Never Used  Substance and Sexual Activity  . Alcohol use: Yes    Comment: rare, glass of wine occasionally  . Drug use: No  . Sexual activity: No  Other Topics Concern  . Not on file  Social History Narrative  . Not on file    Family History  Problem  Relation Age of Onset  . Stroke Mother       Review of systems complete and found to be negative unless listed above      PHYSICAL EXAM  General: Well developed, well nourished, in no acute distress HEENT:  Normocephalic and atramatic Neck:  No JVD.  Lungs: Clear bilaterally to auscultation and percussion. Heart: HRRR . Normal S1 and S2 without gallops or murmurs. Distal pulses 2+ bilaterally  Abdomen: Bowel sounds are hypoactive, abdomen soft and non-tender  Msk:  Gait not assessed Extremities: No clubbing, cyanosis  or edema.   Neuro: Alert and oriented X 3. Psych:  Good affect, demented and unsure of why she came into the ED  Labs:   Lab Results  Component Value Date   WBC 9.7 07/14/2017   HGB 10.0 (L) 07/14/2017   HCT 30.4 (L) 07/14/2017   MCV 89.5 07/14/2017   PLT 295 07/14/2017    Recent Labs  Lab 07/14/17 0528  NA 138  K 3.7  CL 112*  CO2 22  BUN 26*  CREATININE 1.19*  CALCIUM 7.8*  GLUCOSE 118*   Lab Results  Component Value Date   CKMB 1.8 10/01/2013   TROPONINI 0.29 (HH) 07/13/2017   No results found for: CHOL No results found for: HDL No results found for: LDLCALC No results found for: TRIG No results found for: CHOLHDL No results found for: LDLDIRECT    Radiology: Dg Chest 1 View  Result Date: 07/13/2017 CLINICAL DATA:  Tachycardia and back pain today EXAM: CHEST 1 VIEW COMPARISON:  July 10, 2017 FINDINGS: The heart size and mediastinal contours are stable. Patient status post prior CABG and median sternotomy. Both lungs are clear. The lungs are hyperinflated. There is probably a hiatal hernia. The visualized skeletal structures are stable. IMPRESSION: No active cardiopulmonary disease. Electronically Signed   By: Abelardo Diesel M.D.   On: 07/13/2017 16:08   Dg Chest 1 View  Result Date: 07/10/2017 CLINICAL DATA:  Shortness of breath EXAM: CHEST 1 VIEW COMPARISON:  07/07/2017, 05/19/2015 FINDINGS: Post sternotomy changes. Hyperinflation with  emphysematous changes. Streaky atelectasis at the left lung base. No pleural effusion. Stable cardiomediastinal silhouette with aortic atherosclerosis. No pneumothorax. Hiatal hernia. IMPRESSION: 1. Hyperinflation with emphysematous disease. No focal pulmonary infiltrate 2. Probable moderate hiatal hernia. Electronically Signed   By: Donavan Foil M.D.   On: 07/10/2017 20:44   Dg Chest Portable 1 View  Result Date: 07/07/2017 CLINICAL DATA:  Respiratory distress EXAM: PORTABLE CHEST 1 VIEW COMPARISON:  05/19/2015 FINDINGS: Postoperative changes in the mediastinum. Heart size and pulmonary vascularity are normal. Probable emphysematous changes in the upper lungs. Scattered fibrosis in the lungs. No airspace disease or consolidation. No blunting of costophrenic angles. No pneumothorax. Calcification of the aorta. IMPRESSION: Emphysematous changes in the lungs. No consolidation or edema. Aortic atherosclerosis. Electronically Signed   By: Lucienne Capers M.D.   On: 07/07/2017 01:40    EKG: Normal sinus rhythm without acute ischemic changes  ASSESSMENT AND PLAN:  1. SVT with flutter waves, patient currently in sinus rhythm   - Continue diltiazem 120mg  daily  -Metoprolol 12.5 mg bid  -hold nsaids, asa for now.   - Follow up at [facility] as outpatient in 1 week 2. Consider risks/benefits with anticoagulation   - Patient had recent GI bleed, at risk for further bleeds, defer anticoagulation at this time  - Most recent hemoglobin 10.0  - Follow up outpatient, consider Holter monitor, discuss risks/benefits of anticoagulation then  - CHADsVASs score of 5 3. Elevated troponin   - Troponin was 0.29 on 2/7, was 0.61 1 week ago at last admission, likely due to underlying COPD   - Unlikely due to acute ischemic changes  4. COPD  -Pt was on prednisone taper as outpatient which started 2 days ago at 43m followed by 50 mg. Would continue taper reducing by 10 mg daily until off.   Signed: Doristine Mango  PA-S 07/14/2017, 8:24 AM   I have seen and examined the patient. I agree with the assessment and plan above.  Javier Docker Ubaldo Glassing MD

## 2017-07-14 NOTE — Discharge Summary (Signed)
[facility] NAME: Holly Clark    MR#:  [id number]  DATE OF BIRTH:  09-02-1929  DATE OF ADMISSION:  07/13/2017 ADMITTING PHYSICIAN: Demetrios Loll, MD  DATE OF DISCHARGE: 07/14/2017  PRIMARY CARE PHYSICIAN: Rusty Aus, MD    ADMISSION DIAGNOSIS:  SVT (supraventricular tachycardia) (HCC) [I47.1] Atrial flutter, unspecified type ([place]) [I48.92] Back pain, unspecified back location, unspecified back pain laterality, unspecified chronicity [M54.9]  DISCHARGE DIAGNOSIS:  Active Problems:   Sinus tachycardia   SECONDARY DIAGNOSIS:   Past Medical History:  Diagnosis Date  . COPD (chronic obstructive pulmonary disease) ([place])   . Coronary artery disease   . Dementia   . Depression   . Hypothyroidism     HOSPITAL COURSE:   * Sinus tachycardia, unclear etiology. telemetry monitor and cardiology consult.  Continue Lopressor and Cardizem.   Likely anxiety episodes as remained stable on telemetry and troponin. Seen by cardiologist and suggested outpatient cardiac monitor, they will arrange for that.   Her TSH is also noted to be very low and she is on levothyroxine, I will decrease the dose.  * Other recent diagnosis and hospital managements in previous admissions ( when she was discharged on 07/11/17) are as follows  1. Acute hypoxic respiratory failure-secondary to COPD exacerbation. -Currently on BiPAP, breathing is much improved so can be weaned to nasal cannula. -Appreciate pulmonary critical care consult. -Continue steroids, inhalers and nebulizers -On azithromycin.Finished 3 days.  2. Elevated troponin-could be demand ischemia from her hypoxia and also anemia. -Echocardiogram isdone, normal EF.Given heparin drip but held due to dropping hemoglobin. -Denies any chest pain. -Patient has had history of CAD status post bypass graft surgery. Advised to follow with her cardiologist in office.  3. Acute on chronic  anemia-baseline hemoglobin seems to be around 11, significant drop noted, at 7.7 now. -For a few days patient states she has been having dark stools -Anemia labs have been ordered. No acute indication for transfusion at this time unless cardiac strain is worsening. - Iron deficiency noted. Hb was 12.9 in Nov 2018, so ordered GI consult. - transfuse 1 unit PRBC 07/09/16-Hb stable -EGD shows esophagitis and hiatal hernia, GI suggested lifelong PPI.  4. CK D stage III-Baseline creatinine is around 1.4. Monitor, avoid nephrotoxins  5. Hypothyroidism-continue Synthroid    DISCHARGE CONDITIONS:   Stable.  CONSULTS OBTAINED:  Treatment Team:  Teodoro Spray, MD  DRUG ALLERGIES:  Not on File  DISCHARGE MEDICATIONS:   Allergies as of 07/14/2017   Not on File     Medication List    TAKE these medications   albuterol 108 (90 Base) MCG/ACT inhaler Commonly known as:  PROVENTIL HFA;VENTOLIN HFA Inhale 2 puffs into the lungs every 6 (six) hours as needed for wheezing or shortness of breath.   ALPRAZolam 0.25 MG tablet Commonly known as:  XANAX Take 1 tablet (0.25 mg total) by mouth 3 (three) times daily as needed for anxiety.   alum & mag hydroxide-simeth 200-200-20 MG/5ML suspension Commonly known as:  MAALOX/MYLANTA Take 30 mLs by mouth every 4 (four) hours as needed for indigestion.   ANORO ELLIPTA 62.5-25 MCG/INH Aepb Generic drug:  umeclidinium-vilanterol Inhale 1 puff into the lungs daily.   aspirin EC 81 MG tablet Take 81 mg by mouth daily.   buPROPion 75 MG tablet Commonly known as:  WELLBUTRIN Take 75 mg by mouth daily.   diltiazem 120 MG 24 hr capsule Commonly known as:  DILACOR  XR Take 120 mg by mouth daily.   docusate sodium 100 MG capsule Commonly known as:  COLACE Take 1 capsule (100 mg total) by mouth 2 (two) times daily.   donepezil 5 MG tablet Commonly known as:  ARICEPT Take 5 mg by mouth at bedtime.   ferrous sulfate 325 (65 FE) MG  tablet Take 1 tablet (325 mg total) by mouth 2 (two) times daily with a meal.   galantamine 8 MG tablet Commonly known as:  RAZADYNE Take 8 mg by mouth every morning.   HYDROcodone-acetaminophen 5-325 MG tablet Commonly known as:  NORCO/VICODIN Take 1-2 tablets by mouth every 6 (six) hours as needed for moderate pain.   levalbuterol 1.25 MG/3ML nebulizer solution Commonly known as:  XOPENEX Take 1.25 mg by nebulization every 6 (six) hours as needed for wheezing.   levothyroxine 75 MCG tablet Commonly known as:  SYNTHROID, LEVOTHROID Take 1 tablet (75 mcg total) by mouth daily before breakfast. What changed:    medication strength  how much to take   magnesium hydroxide 400 MG/5ML suspension Commonly known as:  MILK OF MAGNESIA Take 30 mLs by mouth daily as needed for mild constipation.   metoprolol tartrate 25 MG tablet Commonly known as:  LOPRESSOR Take 0.5 tablets (12.5 mg total) by mouth 2 (two) times daily.   nitroGLYCERIN 0.4 MG SL tablet Commonly known as:  NITROSTAT Place 0.4 mg under the tongue every 5 (five) minutes as needed for chest pain.   oral electrolytes Tabs tablet Take 1 tablet by mouth.   pantoprazole 40 MG tablet Commonly known as:  PROTONIX Take 1 tablet (40 mg total) by mouth 2 (two) times daily.   predniSONE 10 MG (21) Tbpk tablet Commonly known as:  STERAPRED UNI-PAK 21 TAB Take 6 tabs first day, 5 tab on day 2, then 4 on day 3rd, 3 tabs on day 4th , 2 tab on day 5th, and 1 tab on 6th day.   sertraline 50 MG tablet Commonly known as:  ZOLOFT Take 50 mg by mouth daily.   simvastatin 20 MG tablet Commonly known as:  ZOCOR Take 20 mg by mouth at bedtime.   telmisartan 40 MG tablet Commonly known as:  MICARDIS Take 40 mg by mouth daily.        DISCHARGE INSTRUCTIONS:    Follow with Cardio clinic in 3-4 days.  If you experience worsening of your admission symptoms, develop shortness of breath, life threatening emergency, suicidal  or homicidal thoughts you must seek medical attention immediately by calling 911 or calling your MD immediately  if symptoms less severe.  You Must read complete instructions/literature along with all the possible adverse reactions/side effects for all the Medicines you take and that have been prescribed to you. Take any new Medicines after you have completely understood and accept all the possible adverse reactions/side effects.   Please note  You were cared for by a hospitalist during your hospital stay. If you have any questions about your discharge medications or the care you received while you were in the hospital after you are discharged, you can call the unit and asked to speak with the hospitalist on call if the hospitalist that took care of you is not available. Once you are discharged, your primary care physician will handle any further medical issues. Please note that NO REFILLS for any discharge medications will be authorized once you are discharged, as it is imperative that you return to your primary care physician (or establish a  relationship with a primary care physician if you do not have one) for your aftercare needs so that they can reassess your need for medications and monitor your lab values.    Today   CHIEF COMPLAINT:   Chief Complaint  Patient presents with  . Tachycardia    HISTORY OF PRESENT ILLNESS:  Holly Clark  is a 84 y.o. female with a known history of COPD, CAD, dementia, depression and hypothyroidism.  The patient presented to ED with back pain and left arm pain over the past hours.  She denies any symptoms in the ED.  She was found tachycardia, given 6 of adenosine without improvement.  She is also treated with Cardizem IV.  Heart rate is better controlled now.  ED physician requested observation overnight.   VITAL SIGNS:  Blood pressure (!) 163/65, pulse 74, temperature 98.5 F (36.9 C), temperature source Oral, resp. rate (!) 32, height 5\' 1"  (1.549 m),  weight 45.7 kg (100 lb 11.2 oz), SpO2 100 %.  I/O:    Intake/Output Summary (Last 24 hours) at 07/14/2017 1513 Last data filed at 07/14/2017 1500 Gross per 24 hour  Intake 2286.67 ml  Output 150 ml  Net 2136.67 ml    PHYSICAL EXAMINATION:   GENERAL:84 y.o.-year-old elderly patient lying in the bed with no acute distress.  EYES: Pupils equal, round, reactive to light and accommodation. No scleral icterus. Extraocular muscles intact.  HEENT: Head atraumatic, normocephalic. Oropharynx and nasopharynx clear. Remains on BiPAP NECK: Supple, no jugular venous distention. No thyroid enlargement, no tenderness.  LUNGS: Tight breath sounds bilaterally, with occasional scattered wheezing, no rales,rhonchi or crepitation. No use of accessory muscles of respiration.  CARDIOVASCULAR: S1, S2 normal. No murmurs, rubs, or gallops.  ABDOMEN: Soft, nontender, nondistended. Bowel sounds present. No organomegaly or mass.  EXTREMITIES: No pedal edema, cyanosis, or clubbing.  NEUROLOGIC: Cranial nerves II through XII are intact. Muscle strength 4-5/5 in all extremities. Sensation intact. Gait not checked. Global weakness present PSYCHIATRIC: The patient is alert and oriented x 3.  SKIN: No obvious rash, lesion, or ulcer.     DATA REVIEW:   CBC Recent Labs  Lab 07/14/17 0528  WBC 9.7  HGB 10.0*  HCT 30.4*  PLT 295    Chemistries  Recent Labs  Lab 07/13/17 1550 07/14/17 0528  NA  --  138  K  --  3.7  CL  --  112*  CO2  --  22  GLUCOSE  --  118*  BUN  --  26*  CREATININE  --  1.19*  CALCIUM  --  7.8*  MG 2.2  --     Cardiac Enzymes Recent Labs  Lab 07/13/17 1505  TROPONINI 0.29*    Microbiology Results  Results for orders placed or performed during the hospital encounter of 07/07/17  MRSA PCR Screening     Status: None   Collection Time: 07/07/17  4:01 AM  Result Value Ref Range Status   MRSA by PCR NEGATIVE NEGATIVE Final    Comment:        The GeneXpert MRSA Assay  (FDA approved for NASAL specimens only), is one component of a comprehensive MRSA colonization surveillance program. It is not intended to diagnose MRSA infection nor to guide or monitor treatment for MRSA infections. Performed at [facility], [address] [place], [place] [address]     RADIOLOGY:  Dg Chest 1 View  Result Date: 07/13/2017 CLINICAL DATA:  Tachycardia and back pain today EXAM: CHEST 1  VIEW COMPARISON:  July 10, 2017 FINDINGS: The heart size and mediastinal contours are stable. Patient status post prior CABG and median sternotomy. Both lungs are clear. The lungs are hyperinflated. There is probably a hiatal hernia. The visualized skeletal structures are stable. IMPRESSION: No active cardiopulmonary disease. Electronically Signed   By: Abelardo Diesel M.D.   On: 07/13/2017 16:08    EKG:   Orders placed or performed during the hospital encounter of 07/13/17  . EKG 12-Lead  . EKG 12-Lead  . EKG 12-Lead  . EKG 12-Lead  . EKG 12-Lead  . EKG 12-Lead  . EKG  . EKG  . EKG      Management plans discussed with the patient, family and they are in agreement.  CODE STATUS:     Code Status Orders  (From admission, onward)        Start     Ordered   07/13/17 1943  Full code  Continuous     07/13/17 1943    Code Status History    Date Active Date Inactive Code Status Order ID Comments User Context   07/07/2017 03:38 07/11/2017 19:21 Full Code [id number]  Saundra Shelling, MD Inpatient   05/19/2015 15:34 05/22/2015 18:54 Full Code [id number]  Hessie Knows, MD Inpatient   05/18/2015 17:41 05/19/2015 15:34 Full Code [id number]  Loletha Grayer, MD ED   05/18/2015 16:58 05/18/2015 17:41 Full Code [id number]  Hessie Knows, MD ED    Advance Directive Documentation     Most Recent Value  Type of Advance Directive  Healthcare Power of Attorney, Living will  Pre-existing out of facility DNR order (yellow form or pink MOST form)  No data  "MOST" Form in Place?   No data      TOTAL TIME TAKING CARE OF THIS PATIENT: 35 minutes.  Discussed with her son and daughter in the room.  Vaughan Basta M.D on 07/14/2017 at 3:13 PM  Between 7am to 6pm - Pager - [phone]  After 6pm go to [email or web address] - password [email or web address] [facility]  Office  [phone]  CC: Primary care physician; Rusty Aus, MD   Note: This dictation was prepared with Dragon dictation along with smaller phrase technology. Any transcriptional errors that result from this process are unintentional.

## 2017-07-15 DIAGNOSIS — I471 Supraventricular tachycardia: Secondary | ICD-10-CM | POA: Diagnosis not present

## 2017-07-15 DIAGNOSIS — I4892 Unspecified atrial flutter: Secondary | ICD-10-CM | POA: Diagnosis not present

## 2017-07-15 DIAGNOSIS — I10 Essential (primary) hypertension: Secondary | ICD-10-CM | POA: Diagnosis not present

## 2017-07-15 DIAGNOSIS — J449 Chronic obstructive pulmonary disease, unspecified: Secondary | ICD-10-CM | POA: Diagnosis not present

## 2017-07-15 DIAGNOSIS — M549 Dorsalgia, unspecified: Secondary | ICD-10-CM | POA: Diagnosis not present

## 2017-07-15 DIAGNOSIS — I2510 Atherosclerotic heart disease of native coronary artery without angina pectoris: Secondary | ICD-10-CM | POA: Diagnosis not present

## 2017-07-18 DIAGNOSIS — E782 Mixed hyperlipidemia: Secondary | ICD-10-CM | POA: Diagnosis not present

## 2017-07-18 DIAGNOSIS — I480 Paroxysmal atrial fibrillation: Secondary | ICD-10-CM | POA: Diagnosis not present

## 2017-07-18 DIAGNOSIS — I10 Essential (primary) hypertension: Secondary | ICD-10-CM | POA: Diagnosis not present

## 2017-07-18 DIAGNOSIS — I739 Peripheral vascular disease, unspecified: Secondary | ICD-10-CM | POA: Diagnosis not present

## 2017-07-18 DIAGNOSIS — I25810 Atherosclerosis of coronary artery bypass graft(s) without angina pectoris: Secondary | ICD-10-CM | POA: Diagnosis not present

## 2017-07-19 DIAGNOSIS — I2510 Atherosclerotic heart disease of native coronary artery without angina pectoris: Secondary | ICD-10-CM | POA: Diagnosis not present

## 2017-07-19 DIAGNOSIS — I4892 Unspecified atrial flutter: Secondary | ICD-10-CM | POA: Diagnosis not present

## 2017-07-19 DIAGNOSIS — M549 Dorsalgia, unspecified: Secondary | ICD-10-CM | POA: Diagnosis not present

## 2017-07-19 DIAGNOSIS — I471 Supraventricular tachycardia: Secondary | ICD-10-CM | POA: Diagnosis not present

## 2017-07-19 DIAGNOSIS — I10 Essential (primary) hypertension: Secondary | ICD-10-CM | POA: Diagnosis not present

## 2017-07-19 DIAGNOSIS — J449 Chronic obstructive pulmonary disease, unspecified: Secondary | ICD-10-CM | POA: Diagnosis not present

## 2017-07-21 DIAGNOSIS — I2510 Atherosclerotic heart disease of native coronary artery without angina pectoris: Secondary | ICD-10-CM | POA: Diagnosis not present

## 2017-07-21 DIAGNOSIS — I10 Essential (primary) hypertension: Secondary | ICD-10-CM | POA: Diagnosis not present

## 2017-07-21 DIAGNOSIS — J449 Chronic obstructive pulmonary disease, unspecified: Secondary | ICD-10-CM | POA: Diagnosis not present

## 2017-07-21 DIAGNOSIS — I471 Supraventricular tachycardia: Secondary | ICD-10-CM | POA: Diagnosis not present

## 2017-07-21 DIAGNOSIS — M549 Dorsalgia, unspecified: Secondary | ICD-10-CM | POA: Diagnosis not present

## 2017-07-21 DIAGNOSIS — I4892 Unspecified atrial flutter: Secondary | ICD-10-CM | POA: Diagnosis not present

## 2017-07-22 ENCOUNTER — Emergency Department: Payer: Medicare HMO

## 2017-07-22 ENCOUNTER — Other Ambulatory Visit: Payer: Self-pay

## 2017-07-22 ENCOUNTER — Encounter: Payer: Self-pay | Admitting: Emergency Medicine

## 2017-07-22 ENCOUNTER — Observation Stay
Admission: EM | Admit: 2017-07-22 | Discharge: 2017-07-26 | Disposition: A | Discharge status: Skilled Nursing Facility | Payer: Medicare HMO | Attending: Internal Medicine | Admitting: Internal Medicine

## 2017-07-22 DIAGNOSIS — M4854XA Collapsed vertebra, not elsewhere classified, thoracic region, initial encounter for fracture: Secondary | ICD-10-CM | POA: Insufficient documentation

## 2017-07-22 DIAGNOSIS — R000 Tachycardia, unspecified: Secondary | ICD-10-CM | POA: Diagnosis not present

## 2017-07-22 DIAGNOSIS — F329 Major depressive disorder, single episode, unspecified: Secondary | ICD-10-CM | POA: Diagnosis not present

## 2017-07-22 DIAGNOSIS — R0603 Acute respiratory distress: Secondary | ICD-10-CM | POA: Diagnosis not present

## 2017-07-22 DIAGNOSIS — I130 Hypertensive heart and chronic kidney disease with heart failure and stage 1 through stage 4 chronic kidney disease, or unspecified chronic kidney disease: Secondary | ICD-10-CM | POA: Diagnosis not present

## 2017-07-22 DIAGNOSIS — I2510 Atherosclerotic heart disease of native coronary artery without angina pectoris: Secondary | ICD-10-CM | POA: Diagnosis not present

## 2017-07-22 DIAGNOSIS — E785 Hyperlipidemia, unspecified: Secondary | ICD-10-CM | POA: Insufficient documentation

## 2017-07-22 DIAGNOSIS — K2210 Ulcer of esophagus without bleeding: Secondary | ICD-10-CM | POA: Insufficient documentation

## 2017-07-22 DIAGNOSIS — N182 Chronic kidney disease, stage 2 (mild): Secondary | ICD-10-CM | POA: Diagnosis not present

## 2017-07-22 DIAGNOSIS — Z951 Presence of aortocoronary bypass graft: Secondary | ICD-10-CM | POA: Insufficient documentation

## 2017-07-22 DIAGNOSIS — D631 Anemia in chronic kidney disease: Secondary | ICD-10-CM | POA: Diagnosis not present

## 2017-07-22 DIAGNOSIS — Z90710 Acquired absence of both cervix and uterus: Secondary | ICD-10-CM | POA: Insufficient documentation

## 2017-07-22 DIAGNOSIS — J441 Chronic obstructive pulmonary disease with (acute) exacerbation: Secondary | ICD-10-CM | POA: Insufficient documentation

## 2017-07-22 DIAGNOSIS — J9601 Acute respiratory failure with hypoxia: Secondary | ICD-10-CM | POA: Diagnosis not present

## 2017-07-22 DIAGNOSIS — J209 Acute bronchitis, unspecified: Secondary | ICD-10-CM | POA: Insufficient documentation

## 2017-07-22 DIAGNOSIS — I5033 Acute on chronic diastolic (congestive) heart failure: Secondary | ICD-10-CM | POA: Insufficient documentation

## 2017-07-22 DIAGNOSIS — Z9841 Cataract extraction status, right eye: Secondary | ICD-10-CM | POA: Insufficient documentation

## 2017-07-22 DIAGNOSIS — I700 Atherosclerosis of aorta: Secondary | ICD-10-CM | POA: Diagnosis not present

## 2017-07-22 DIAGNOSIS — J9690 Respiratory failure, unspecified, unspecified whether with hypoxia or hypercapnia: Secondary | ICD-10-CM | POA: Diagnosis present

## 2017-07-22 DIAGNOSIS — E039 Hypothyroidism, unspecified: Secondary | ICD-10-CM | POA: Insufficient documentation

## 2017-07-22 DIAGNOSIS — J90 Pleural effusion, not elsewhere classified: Secondary | ICD-10-CM | POA: Diagnosis not present

## 2017-07-22 DIAGNOSIS — J440 Chronic obstructive pulmonary disease with acute lower respiratory infection: Secondary | ICD-10-CM | POA: Insufficient documentation

## 2017-07-22 DIAGNOSIS — K449 Diaphragmatic hernia without obstruction or gangrene: Secondary | ICD-10-CM | POA: Insufficient documentation

## 2017-07-22 DIAGNOSIS — Z885 Allergy status to narcotic agent status: Secondary | ICD-10-CM | POA: Insufficient documentation

## 2017-07-22 DIAGNOSIS — Z79899 Other long term (current) drug therapy: Secondary | ICD-10-CM | POA: Insufficient documentation

## 2017-07-22 DIAGNOSIS — Z9842 Cataract extraction status, left eye: Secondary | ICD-10-CM | POA: Insufficient documentation

## 2017-07-22 DIAGNOSIS — R0602 Shortness of breath: Secondary | ICD-10-CM | POA: Diagnosis not present

## 2017-07-22 DIAGNOSIS — Z7982 Long term (current) use of aspirin: Secondary | ICD-10-CM | POA: Insufficient documentation

## 2017-07-22 DIAGNOSIS — R0600 Dyspnea, unspecified: Secondary | ICD-10-CM | POA: Diagnosis not present

## 2017-07-22 DIAGNOSIS — F17210 Nicotine dependence, cigarettes, uncomplicated: Secondary | ICD-10-CM | POA: Diagnosis not present

## 2017-07-22 DIAGNOSIS — F0390 Unspecified dementia without behavioral disturbance: Secondary | ICD-10-CM | POA: Diagnosis not present

## 2017-07-22 HISTORY — DX: Unspecified diastolic (congestive) heart failure: I50.30

## 2017-07-22 LAB — CBC WITH DIFFERENTIAL/PLATELET
BAND NEUTROPHILS: 3 %
BASOS PCT: 0 %
Basophils Absolute: 0 10*3/uL (ref 0–0.1)
Blasts: 0 %
EOS ABS: 0.2 10*3/uL (ref 0–0.7)
Eosinophils Relative: 2 %
HCT: 36.1 % (ref 35.0–47.0)
Hemoglobin: 11.9 g/dL — ABNORMAL LOW (ref 12.0–16.0)
LYMPHS PCT: 17 %
Lymphs Abs: 1.6 10*3/uL (ref 1.0–3.6)
MCH: 30.1 pg (ref 26.0–34.0)
MCHC: 33 g/dL (ref 32.0–36.0)
MCV: 91.4 fL (ref 80.0–100.0)
MONO ABS: 1.4 10*3/uL — AB (ref 0.2–0.9)
MONOS PCT: 15 %
Metamyelocytes Relative: 1 %
Myelocytes: 0 %
NEUTROS ABS: 6.2 10*3/uL (ref 1.4–6.5)
NEUTROS PCT: 62 %
NRBC: 0 /100{WBCs}
OTHER: 0 %
Platelets: 195 10*3/uL (ref 150–440)
Promyelocytes Absolute: 0 %
RBC: 3.95 MIL/uL (ref 3.80–5.20)
RDW: 17.2 % — AB (ref 11.5–14.5)
WBC: 9.4 10*3/uL (ref 3.6–11.0)

## 2017-07-22 LAB — COMPREHENSIVE METABOLIC PANEL
ALBUMIN: 3.7 g/dL (ref 3.5–5.0)
ALT: 37 U/L (ref 14–54)
ANION GAP: 10 (ref 5–15)
AST: 25 U/L (ref 15–41)
Alkaline Phosphatase: 106 U/L (ref 38–126)
BUN: 27 mg/dL — ABNORMAL HIGH (ref 6–20)
CALCIUM: 8.8 mg/dL — AB (ref 8.9–10.3)
CO2: 25 mmol/L (ref 22–32)
Chloride: 106 mmol/L (ref 101–111)
Creatinine, Ser: 1.3 mg/dL — ABNORMAL HIGH (ref 0.44–1.00)
GFR calc non Af Amer: 36 mL/min — ABNORMAL LOW (ref >60)
GFR, EST AFRICAN AMERICAN: 42 mL/min — AB (ref >60)
GLUCOSE: 112 mg/dL — AB (ref 65–99)
POTASSIUM: 3.6 mmol/L (ref 3.5–5.1)
Sodium: 141 mmol/L (ref 135–145)
Total Bilirubin: 0.7 mg/dL (ref 0.3–1.2)
Total Protein: 6.5 g/dL (ref 6.5–8.1)

## 2017-07-22 LAB — BRAIN NATRIURETIC PEPTIDE: B NATRIURETIC PEPTIDE 5: 1667 pg/mL — AB (ref 0.0–100.0)

## 2017-07-22 LAB — TROPONIN I: TROPONIN I: 0.3 ng/mL — AB (ref <0.03)

## 2017-07-22 LAB — INFLUENZA PANEL BY PCR (TYPE A & B)
INFLAPCR: NEGATIVE
INFLBPCR: NEGATIVE

## 2017-07-22 MED ORDER — SODIUM CHLORIDE 0.9 % IV BOLUS (SEPSIS)
1000.0000 mL | Freq: Once | INTRAVENOUS | Status: AC
  Administered 2017-07-23: 1000 mL via INTRAVENOUS

## 2017-07-22 MED ORDER — IPRATROPIUM-ALBUTEROL 0.5-2.5 (3) MG/3ML IN SOLN
3.0000 mL | Freq: Once | RESPIRATORY_TRACT | Status: AC
  Administered 2017-07-22: 3 mL via RESPIRATORY_TRACT
  Filled 2017-07-22: qty 3

## 2017-07-22 MED ORDER — IOPAMIDOL (ISOVUE-370) INJECTION 76%
50.0000 mL | Freq: Once | INTRAVENOUS | Status: AC | PRN
  Administered 2017-07-22: 50 mL via INTRAVENOUS

## 2017-07-22 MED ORDER — LORAZEPAM 0.5 MG PO TABS
0.2500 mg | ORAL_TABLET | Freq: Once | ORAL | Status: AC
  Administered 2017-07-23: 0.25 mg via ORAL
  Filled 2017-07-22: qty 1

## 2017-07-22 MED ORDER — METHYLPREDNISOLONE SODIUM SUCC 125 MG IJ SOLR
125.0000 mg | Freq: Once | INTRAMUSCULAR | Status: AC
  Administered 2017-07-22: 125 mg via INTRAVENOUS
  Filled 2017-07-22: qty 2

## 2017-07-22 NOTE — ED Notes (Signed)
This RN taking patient to CTA with RT

## 2017-07-22 NOTE — ED Notes (Signed)
Returned with patient from CT

## 2017-07-22 NOTE — ED Triage Notes (Signed)
Patient arrived from home with SOB, states she quit smoking 2 weeks ago.  EMS states she was at home giving herself a neb when they put her on CPAP, and she was 100% oxygen saturation.  Pt in NAD at this time and talking/joking with EMS and staff.  MD at bedside during triage.

## 2017-07-22 NOTE — ED Provider Notes (Signed)
[facility] Emergency Department Provider Note    None    (approximate)  I have reviewed the triage vital signs and the nursing notes.   HISTORY  Chief Complaint Shortness of Breath  Level V Caveat:  Respiratory distress   HPI Holly Clark is a 84 y.o. female with a history of COPD dementia depression presents from home with worsening shortness of breath.  Patient found to be hypoxic to the low 80s.  No fever.  Due to her respiratory distress while receiving nebulizer treatment patient had CPAP placed by EMS and she was brought emergently to the ER.  Patient states that she does have some improvement after being placed on BiPAP.  Denies any fevers cough or chest pain.  No lower extremity swelling.  No recent antibiotics or steroids.  Past Medical History:  Diagnosis Date  . COPD (chronic obstructive pulmonary disease) ([place])   . Coronary artery disease   . Dementia   . Depression   . Hypothyroidism    Family History  Problem Relation Age of Onset  . Stroke Mother    Past Surgical History:  Procedure Laterality Date  . ABDOMINAL HYSTERECTOMY    . CARDIAC SURGERY    . CATARACT EXTRACTION Bilateral 2012  . CORONARY ARTERY BYPASS GRAFT    . ESOPHAGOGASTRODUODENOSCOPY N/A 07/09/2017   Procedure: ESOPHAGOGASTRODUODENOSCOPY (EGD);  Surgeon: Lin Landsman, MD;  Location: [facility] ENDOSCOPY;  Service: Gastroenterology;  Laterality: N/A;  . [place]   patient unsure of which side  . HIP PINNING,CANNULATED Right 05/19/2015   Procedure: CANNULATED HIP PINNING;  Surgeon: Hessie Knows, MD;  Location: [facility] ORS;  Service: Orthopedics;  Laterality: Right;   Patient Active Problem List   Diagnosis Date Noted  . Sinus tachycardia 07/13/2017  . Esophagitis, erosive   . Iron deficiency anemia due to chronic blood loss   . [place] lesion, chronic   . Large hiatal hernia   . COPD exacerbation ([place]) 07/07/2017  . Hip fracture, right ([place])  05/18/2015      Prior to Admission medications   Medication Sig Start Date End Date Taking? Authorizing Provider  albuterol (PROVENTIL HFA;VENTOLIN HFA) 108 (90 BASE) MCG/ACT inhaler Inhale 2 puffs into the lungs every 6 (six) hours as needed for wheezing or shortness of breath. 05/22/15  Yes Epifanio Lesches, MD  ALPRAZolam Duanne Moron) 0.25 MG tablet Take 1 tablet (0.25 mg total) by mouth 3 (three) times daily as needed for anxiety. 07/14/17  Yes Vaughan Basta, MD  alum & mag hydroxide-simeth (MAALOX/MYLANTA) 200-200-20 MG/5ML suspension Take 30 mLs by mouth every 4 (four) hours as needed for indigestion. 05/22/15  Yes Epifanio Lesches, MD  aspirin EC 81 MG tablet Take 81 mg by mouth daily.   Yes [provider]  buPROPion (WELLBUTRIN) 75 MG tablet Take 75 mg by mouth daily.   Yes [provider]  diltiazem (DILACOR XR) 120 MG 24 hr capsule Take 120 mg by mouth daily.   Yes [provider]  docusate sodium (COLACE) 100 MG capsule Take 1 capsule (100 mg total) by mouth 2 (two) times daily. 05/22/15  Yes Epifanio Lesches, MD  donepezil (ARICEPT) 5 MG tablet Take 5 mg by mouth at bedtime.   Yes [provider]  ferrous sulfate 325 (65 FE) MG tablet Take 1 tablet (325 mg total) by mouth 2 (two) times daily with a meal. 07/10/17  Yes Vaughan Basta, MD  galantamine (RAZADYNE) 8 MG tablet Take 8 mg by  mouth every morning.   Yes [provider]  levalbuterol (XOPENEX) 1.25 MG/3ML nebulizer solution Take 1.25 mg by nebulization every 6 (six) hours as needed for wheezing.   Yes [provider]  levothyroxine (SYNTHROID, LEVOTHROID) 75 MCG tablet Take 1 tablet (75 mcg total) by mouth daily before breakfast. 07/14/17  Yes Vaughan Basta, MD  metoprolol tartrate (LOPRESSOR) 25 MG tablet Take 0.5 tablets (12.5 mg total) by mouth 2 (two) times daily. 07/10/17  Yes Vaughan Basta, MD  nitroGLYCERIN (NITROSTAT) 0.4 MG SL  tablet Place 0.4 mg under the tongue every 5 (five) minutes as needed for chest pain.   Yes [provider]  oral electrolytes (THERMOTABS) TABS tablet Take 1 tablet by mouth.   Yes [provider]  pantoprazole (PROTONIX) 40 MG tablet Take 1 tablet (40 mg total) by mouth 2 (two) times daily. 07/10/17  Yes Vaughan Basta, MD  sertraline (ZOLOFT) 50 MG tablet Take 50 mg by mouth daily.   Yes [provider]  simvastatin (ZOCOR) 20 MG tablet Take 20 mg by mouth at bedtime.   Yes [provider]  telmisartan (MICARDIS) 40 MG tablet Take 40 mg by mouth daily.   Yes [provider]  Umeclidinium-Vilanterol (ANORO ELLIPTA) 62.5-25 MCG/INH AEPB Inhale 1 puff into the lungs daily.   Yes [provider]  HYDROcodone-acetaminophen (NORCO/VICODIN) 5-325 MG tablet Take 1-2 tablets by mouth every 6 (six) hours as needed for moderate pain. Patient not taking: Reported on 07/07/2017 05/22/15   Epifanio Lesches, MD  magnesium hydroxide (MILK OF MAGNESIA) 400 MG/5ML suspension Take 30 mLs by mouth daily as needed for mild constipation. Patient not taking: Reported on 07/07/2017 05/22/15   Epifanio Lesches, MD  predniSONE (STERAPRED UNI-PAK 21 TAB) 10 MG (21) TBPK tablet Take 6 tabs first day, 5 tab on day 2, then 4 on day 3rd, 3 tabs on day 4th , 2 tab on day 5th, and 1 tab on 6th day. Patient not taking: Reported on 07/22/2017 07/10/17   Vaughan Basta, MD    Allergies Hydrocodone and Oxycodone    Social History Social History   Tobacco Use  . Smoking status: Current Every Day Smoker    Packs/day: 0.25    Types: Cigarettes  . Smokeless tobacco: Never Used  Substance Use Topics  . Alcohol use: Yes    Comment: rare, glass of wine occasionally  . Drug use: No    Review of Systems Patient denies headaches, rhinorrhea, blurry vision, numbness, shortness of breath, chest pain, edema, cough, abdominal pain, nausea, vomiting, diarrhea,  dysuria, fevers, rashes or hallucinations unless otherwise stated above in HPI. ____________________________________________   PHYSICAL EXAM:  VITAL SIGNS: Vitals:   07/22/17 2100 07/22/17 2101  BP: 117/67 117/67  Pulse: 77 84  Resp:  (!) 22  Temp:  98.7 F (37.1 C)  SpO2: 100% 100%    Constitutional: Alert and oriented. In moderate acute resp distress Eyes: Conjunctivae are normal.  Head: Atraumatic. Nose: No congestion/rhinnorhea. Mouth/Throat: Mucous membranes are moist.   Neck: No stridor. Painless ROM.  Cardiovascular: Normal rate, regular rhythm. Grossly normal heart sounds.  Good peripheral circulation. Respiratory: tachypnea with dimiinished breathsounds throughout. Gastrointestinal: Soft and nontender. No distention. No abdominal bruits. No CVA tenderness. Genitourinary:  Musculoskeletal: No lower extremity tenderness nor edema.  No joint effusions. Neurologic:   No gross focal neurologic deficits are appreciated. No facial droop Skin:  Skin is warm, dry and intact. No rash noted. Psychiatric: Mood and affect are normal. Speech and  behavior are normal.  ____________________________________________   LABS (all labs ordered are listed, but only abnormal results are displayed)  Results for orders placed or performed during the hospital encounter of 07/22/17 (from the past 24 hour(s))  CBC with Differential/Platelet     Status: Abnormal   Collection Time: 07/22/17  8:55 PM  Result Value Ref Range   WBC 9.4 3.6 - 11.0 K/uL   RBC 3.95 3.80 - 5.20 MIL/uL   Hemoglobin 11.9 (L) 12.0 - 16.0 g/dL   HCT 36.1 35.0 - 47.0 %   MCV 91.4 80.0 - 100.0 fL   MCH 30.1 26.0 - 34.0 pg   MCHC 33.0 32.0 - 36.0 g/dL   RDW 17.2 (H) 11.5 - 14.5 %   Platelets 195 150 - 440 K/uL   Neutrophils Relative % 62 %   Lymphocytes Relative 17 %   Monocytes Relative 15 %   Eosinophils Relative 2 %   Basophils Relative 0 %   Band Neutrophils 3 %   Metamyelocytes Relative 1 %   Myelocytes 0 %    Promyelocytes Absolute 0 %   Blasts 0 %   nRBC 0 0 /100 WBC   Other 0 %   Neutro Abs 6.2 1.4 - 6.5 K/uL   Lymphs Abs 1.6 1.0 - 3.6 K/uL   Monocytes Absolute 1.4 (H) 0.2 - 0.9 K/uL   Eosinophils Absolute 0.2 0 - 0.7 K/uL   Basophils Absolute 0.0 0 - 0.1 K/uL   Smear Review MORPHOLOGY UNREMARKABLE   Comprehensive metabolic panel     Status: Abnormal   Collection Time: 07/22/17  8:55 PM  Result Value Ref Range   Sodium 141 135 - 145 mmol/L   Potassium 3.6 3.5 - 5.1 mmol/L   Chloride 106 101 - 111 mmol/L   CO2 25 22 - 32 mmol/L   Glucose, Bld 112 (H) 65 - 99 mg/dL   BUN 27 (H) 6 - 20 mg/dL   Creatinine, Ser 1.30 (H) 0.44 - 1.00 mg/dL   Calcium 8.8 (L) 8.9 - 10.3 mg/dL   Total Protein 6.5 6.5 - 8.1 g/dL   Albumin 3.7 3.5 - 5.0 g/dL   AST 25 15 - 41 U/L   ALT 37 14 - 54 U/L   Alkaline Phosphatase 106 38 - 126 U/L   Total Bilirubin 0.7 0.3 - 1.2 mg/dL   GFR calc non Af Amer 36 (L) >60 mL/min   GFR calc Af Amer 42 (L) >60 mL/min   Anion gap 10 5 - 15  Brain natriuretic peptide     Status: Abnormal   Collection Time: 07/22/17  8:55 PM  Result Value Ref Range   B Natriuretic Peptide 1,667.0 (H) 0.0 - 100.0 pg/mL  Troponin I     Status: Abnormal   Collection Time: 07/22/17  8:55 PM  Result Value Ref Range   Troponin I 0.30 (HH) <0.03 ng/mL  Influenza panel by PCR (type A & B)     Status: None   Collection Time: 07/22/17  8:55 PM  Result Value Ref Range   Influenza A By PCR NEGATIVE NEGATIVE   Influenza B By PCR NEGATIVE NEGATIVE  Blood gas, venous     Status: None (Preliminary result)   Collection Time: 07/22/17  9:01 PM  Result Value Ref Range   Delivery systems BILEVEL POSITIVE AIRWAY PRESSURE    pH, Ven 7.30 7.250 - 7.430   pCO2, Ven 54 44.0 - 60.0 mmHg   pO2, Ven PENDING 32.0 - 45.0 mmHg  Bicarbonate 26.6 20.0 - 28.0 mmol/L   Acid-base deficit 0.5 0.0 - 2.0 mmol/L   O2 Saturation PENDING %   Patient temperature 37.0     ____________________________________________  EKG My review and personal interpretation at Time: 21:01   Indication: sob  Rate: 75  Rhythm: sinus Axis: normal Other: occasional pac, no stemi, nonspecific st changes ____________________________________________  RADIOLOGY  I personally reviewed all radiographic images ordered to evaluate for the above acute complaints and reviewed radiology reports and findings.  These findings were personally discussed with the patient.  Please see medical record for radiology report.  ____________________________________________   PROCEDURES  Procedure(s) performed:  .Critical Care Performed by: Merlyn Lot, MD Authorized by: Merlyn Lot, MD   Critical care provider statement:    Critical care time (minutes):  40   Critical care time was exclusive of:  Separately billable procedures and treating other patients   Critical care was necessary to treat or prevent imminent or life-threatening deterioration of the following conditions:  Respiratory failure   Critical care was time spent personally by me on the following activities:  Development of treatment plan with patient or surrogate, discussions with consultants, evaluation of patient's response to treatment, examination of patient, obtaining history from patient or surrogate, ordering and performing treatments and interventions, ordering and review of laboratory studies, ordering and review of radiographic studies, pulse oximetry, re-evaluation of patient's condition and review of old charts      Critical Care performed: yes ____________________________________________   INITIAL IMPRESSION / [place] / ED COURSE  Pertinent labs & imaging results that were available during my care of the patient were reviewed by me and considered in my medical decision making (see chart for details).  DDX: Asthma, copd, CHF, pna, ptx, malignancy, Pe, anemia   Holly Clark is a  84 y.o. who presents to the ED with acute respiratory distress currently on CPAP.  Based on her respiratory distress patient was transitioned over to BiPAP.  Started on nebulizer treatment as well as steroids.  Seems less consistent with CHF but may have a component of that.  Will check for flu.  Seems less consistent with pneumonia.  The patient will be placed on continuous pulse oximetry and telemetry for monitoring.  Laboratory evaluation will be sent to evaluate for the above complaints.     Clinical Course as of Jul 22 2357  Sat Jul 22, 2017  2213 Patient with elevated troponin but it seems to be roughly at baseline.  Will order CT angiogram to evaluate for pulmonary embolism and some borderline hypertension.  Will provide IV fluids.  [PR]    Clinical Course User Index [PR] Merlyn Lot, MD   ----------------------------------------- 11:58 PM on 07/22/2017 -----------------------------------------  Patient symmetrically improved on BiPAP.  May be able to be weaned off BiPAP shortly.  CT angiogram shows no evidence of PE.  Presentation most consistent with COPD exacerbation.  Patient will require admission for further respiratory monitoring  ____________________________________________   FINAL CLINICAL IMPRESSION(S) / ED DIAGNOSES  Final diagnoses:  Acute respiratory failure with hypoxia (HCC)  COPD exacerbation (HCC)      NEW MEDICATIONS STARTED DURING THIS VISIT:  New Prescriptions   No medications on file     Note:  This document was prepared using Dragon voice recognition software and may include unintentional dictation errors.    Merlyn Lot, MD 07/22/17 [phone]

## 2017-07-23 ENCOUNTER — Encounter: Payer: Self-pay | Admitting: Internal Medicine

## 2017-07-23 ENCOUNTER — Other Ambulatory Visit: Payer: Self-pay

## 2017-07-23 DIAGNOSIS — J9601 Acute respiratory failure with hypoxia: Secondary | ICD-10-CM | POA: Diagnosis not present

## 2017-07-23 DIAGNOSIS — F17200 Nicotine dependence, unspecified, uncomplicated: Secondary | ICD-10-CM | POA: Diagnosis not present

## 2017-07-23 DIAGNOSIS — N183 Chronic kidney disease, stage 3 (moderate): Secondary | ICD-10-CM | POA: Diagnosis not present

## 2017-07-23 DIAGNOSIS — J441 Chronic obstructive pulmonary disease with (acute) exacerbation: Secondary | ICD-10-CM | POA: Diagnosis not present

## 2017-07-23 DIAGNOSIS — J9690 Respiratory failure, unspecified, unspecified whether with hypoxia or hypercapnia: Secondary | ICD-10-CM | POA: Diagnosis present

## 2017-07-23 DIAGNOSIS — J209 Acute bronchitis, unspecified: Secondary | ICD-10-CM | POA: Diagnosis not present

## 2017-07-23 LAB — BASIC METABOLIC PANEL
Anion gap: 12 (ref 5–15)
BUN: 24 mg/dL — AB (ref 6–20)
CALCIUM: 8 mg/dL — AB (ref 8.9–10.3)
CO2: 19 mmol/L — ABNORMAL LOW (ref 22–32)
Chloride: 106 mmol/L (ref 101–111)
Creatinine, Ser: 1.21 mg/dL — ABNORMAL HIGH (ref 0.44–1.00)
GFR calc Af Amer: 46 mL/min — ABNORMAL LOW (ref >60)
GFR calc non Af Amer: 40 mL/min — ABNORMAL LOW (ref >60)
GLUCOSE: 217 mg/dL — AB (ref 65–99)
Potassium: 3.5 mmol/L (ref 3.5–5.1)
Sodium: 137 mmol/L (ref 135–145)

## 2017-07-23 LAB — CBC
HEMATOCRIT: 30.1 % — AB (ref 35.0–47.0)
Hemoglobin: 9.9 g/dL — ABNORMAL LOW (ref 12.0–16.0)
MCH: 30.5 pg (ref 26.0–34.0)
MCHC: 33 g/dL (ref 32.0–36.0)
MCV: 92.5 fL (ref 80.0–100.0)
Platelets: 135 10*3/uL — ABNORMAL LOW (ref 150–440)
RBC: 3.25 MIL/uL — ABNORMAL LOW (ref 3.80–5.20)
RDW: 17 % — AB (ref 11.5–14.5)
WBC: 7.6 10*3/uL (ref 3.6–11.0)

## 2017-07-23 LAB — GLUCOSE, CAPILLARY: Glucose-Capillary: 173 mg/dL — ABNORMAL HIGH (ref 65–99)

## 2017-07-23 LAB — TROPONIN I: TROPONIN I: 0.22 ng/mL — AB (ref <0.03)

## 2017-07-23 MED ORDER — IPRATROPIUM-ALBUTEROL 0.5-2.5 (3) MG/3ML IN SOLN
3.0000 mL | Freq: Four times a day (QID) | RESPIRATORY_TRACT | Status: DC
  Administered 2017-07-23 (×4): 3 mL via RESPIRATORY_TRACT
  Filled 2017-07-23 (×5): qty 3

## 2017-07-23 MED ORDER — IRBESARTAN 150 MG PO TABS
75.0000 mg | ORAL_TABLET | Freq: Every day | ORAL | Status: DC
  Administered 2017-07-23 – 2017-07-26 (×4): 75 mg via ORAL
  Filled 2017-07-23 (×4): qty 1

## 2017-07-23 MED ORDER — DILTIAZEM HCL ER COATED BEADS 120 MG PO CP24
120.0000 mg | ORAL_CAPSULE | Freq: Every day | ORAL | Status: DC
  Administered 2017-07-23 – 2017-07-26 (×4): 120 mg via ORAL
  Filled 2017-07-23 (×7): qty 1

## 2017-07-23 MED ORDER — METHYLPREDNISOLONE SODIUM SUCC 125 MG IJ SOLR
60.0000 mg | Freq: Four times a day (QID) | INTRAMUSCULAR | Status: DC
  Administered 2017-07-23: 60 mg via INTRAVENOUS
  Filled 2017-07-23: qty 2

## 2017-07-23 MED ORDER — ONDANSETRON HCL 4 MG PO TABS: 4.0000 mg | ORAL_TABLET | Freq: Four times a day (QID) | ORAL | Status: DC | PRN

## 2017-07-23 MED ORDER — PANTOPRAZOLE SODIUM 40 MG PO TBEC
40.0000 mg | DELAYED_RELEASE_TABLET | Freq: Two times a day (BID) | ORAL | Status: DC
  Administered 2017-07-23 – 2017-07-26 (×8): 40 mg via ORAL
  Filled 2017-07-23 (×8): qty 1

## 2017-07-23 MED ORDER — DONEPEZIL HCL 5 MG PO TABS
5.0000 mg | ORAL_TABLET | Freq: Every day | ORAL | Status: DC
  Administered 2017-07-23 – 2017-07-25 (×4): 5 mg via ORAL
  Filled 2017-07-23 (×5): qty 1

## 2017-07-23 MED ORDER — ASPIRIN EC 81 MG PO TBEC
81.0000 mg | DELAYED_RELEASE_TABLET | Freq: Every day | ORAL | Status: DC
  Administered 2017-07-23 – 2017-07-26 (×4): 81 mg via ORAL
  Filled 2017-07-23 (×4): qty 1

## 2017-07-23 MED ORDER — LEVOTHYROXINE SODIUM 50 MCG PO TABS
75.0000 ug | ORAL_TABLET | Freq: Every day | ORAL | Status: DC
  Administered 2017-07-23 – 2017-07-26 (×4): 75 ug via ORAL
  Filled 2017-07-23 (×4): qty 1

## 2017-07-23 MED ORDER — DOXYCYCLINE HYCLATE 100 MG PO TABS
100.0000 mg | ORAL_TABLET | Freq: Two times a day (BID) | ORAL | Status: DC
  Administered 2017-07-23 – 2017-07-26 (×7): 100 mg via ORAL
  Filled 2017-07-23 (×7): qty 1

## 2017-07-23 MED ORDER — SODIUM CHLORIDE 0.9 % IV SOLN
INTRAVENOUS | Status: DC
  Administered 2017-07-23: 04:00:00 via INTRAVENOUS

## 2017-07-23 MED ORDER — ACETAMINOPHEN 325 MG PO TABS: 650.0000 mg | ORAL_TABLET | Freq: Four times a day (QID) | ORAL | Status: DC | PRN

## 2017-07-23 MED ORDER — LEVOFLOXACIN IN D5W 500 MG/100ML IV SOLN
500.0000 mg | Freq: Once | INTRAVENOUS | Status: AC
Start: 2017-07-23 — End: 2017-07-23
  Administered 2017-07-23: 500 mg via INTRAVENOUS
  Filled 2017-07-23: qty 100

## 2017-07-23 MED ORDER — NITROGLYCERIN 0.4 MG SL SUBL: 0.4000 mg | SUBLINGUAL_TABLET | SUBLINGUAL | Status: DC | PRN

## 2017-07-23 MED ORDER — ONDANSETRON HCL 4 MG/2ML IJ SOLN: 4.0000 mg | Freq: Four times a day (QID) | INTRAMUSCULAR | Status: DC | PRN

## 2017-07-23 MED ORDER — HEPARIN SODIUM (PORCINE) 5000 UNIT/ML IJ SOLN
5000.0000 [IU] | Freq: Three times a day (TID) | INTRAMUSCULAR | Status: DC
  Administered 2017-07-23 – 2017-07-26 (×10): 5000 [IU] via SUBCUTANEOUS
  Filled 2017-07-23 (×11): qty 1

## 2017-07-23 MED ORDER — ACETAMINOPHEN 650 MG RE SUPP: 650.0000 mg | Freq: Four times a day (QID) | RECTAL | Status: DC | PRN

## 2017-07-23 MED ORDER — METOPROLOL TARTRATE 25 MG PO TABS
12.5000 mg | ORAL_TABLET | Freq: Two times a day (BID) | ORAL | Status: DC
  Administered 2017-07-23 – 2017-07-26 (×7): 12.5 mg via ORAL
  Filled 2017-07-23 (×7): qty 1

## 2017-07-23 MED ORDER — ALUM & MAG HYDROXIDE-SIMETH 200-200-20 MG/5ML PO SUSP: 30.0000 mL | ORAL | Status: DC | PRN

## 2017-07-23 MED ORDER — BISACODYL 5 MG PO TBEC: 5.0000 mg | DELAYED_RELEASE_TABLET | Freq: Every day | ORAL | Status: DC | PRN

## 2017-07-23 MED ORDER — ALPRAZOLAM 0.25 MG PO TABS: 0.2500 mg | ORAL_TABLET | Freq: Three times a day (TID) | ORAL | Status: DC | PRN

## 2017-07-23 MED ORDER — METHYLPREDNISOLONE SODIUM SUCC 125 MG IJ SOLR
60.0000 mg | Freq: Four times a day (QID) | INTRAMUSCULAR | Status: DC
  Administered 2017-07-23: 60 mg via INTRAVENOUS

## 2017-07-23 MED ORDER — FUROSEMIDE 10 MG/ML IJ SOLN
20.0000 mg | Freq: Once | INTRAMUSCULAR | Status: AC
  Administered 2017-07-23: 20 mg via INTRAVENOUS
  Filled 2017-07-23: qty 2

## 2017-07-23 MED ORDER — METOPROLOL TARTRATE 25 MG PO TABS: 12.5000 mg | ORAL_TABLET | Freq: Two times a day (BID) | ORAL | Status: DC

## 2017-07-23 MED ORDER — SERTRALINE HCL 50 MG PO TABS
50.0000 mg | ORAL_TABLET | Freq: Every day | ORAL | Status: DC
  Administered 2017-07-23 – 2017-07-26 (×4): 50 mg via ORAL
  Filled 2017-07-23 (×4): qty 1

## 2017-07-23 MED ORDER — SIMVASTATIN 20 MG PO TABS
20.0000 mg | ORAL_TABLET | Freq: Every day | ORAL | Status: DC
  Administered 2017-07-23 – 2017-07-25 (×3): 20 mg via ORAL
  Filled 2017-07-23 (×3): qty 1

## 2017-07-23 MED ORDER — DOCUSATE SODIUM 100 MG PO CAPS
100.0000 mg | ORAL_CAPSULE | Freq: Two times a day (BID) | ORAL | Status: DC
  Administered 2017-07-23 – 2017-07-26 (×8): 100 mg via ORAL
  Filled 2017-07-23 (×8): qty 1

## 2017-07-23 MED ORDER — FERROUS SULFATE 325 (65 FE) MG PO TABS
325.0000 mg | ORAL_TABLET | Freq: Two times a day (BID) | ORAL | Status: DC
  Administered 2017-07-23 – 2017-07-26 (×7): 325 mg via ORAL
  Filled 2017-07-23 (×7): qty 1

## 2017-07-23 MED ORDER — BUPROPION HCL 75 MG PO TABS
75.0000 mg | ORAL_TABLET | Freq: Every day | ORAL | Status: DC
  Administered 2017-07-23 – 2017-07-26 (×4): 75 mg via ORAL
  Filled 2017-07-23 (×4): qty 1

## 2017-07-23 MED ORDER — METHYLPREDNISOLONE SODIUM SUCC 40 MG IJ SOLR
40.0000 mg | Freq: Two times a day (BID) | INTRAMUSCULAR | Status: DC
Start: 2017-07-23 — End: 2017-07-26
  Administered 2017-07-23 – 2017-07-26 (×6): 40 mg via INTRAVENOUS
  Filled 2017-07-23 (×6): qty 1

## 2017-07-23 MED ORDER — GALANTAMINE HYDROBROMIDE 4 MG PO TABS
8.0000 mg | ORAL_TABLET | Freq: Every morning | ORAL | Status: DC
  Filled 2017-07-23: qty 2

## 2017-07-23 MED ORDER — LEVOFLOXACIN IN D5W 250 MG/50ML IV SOLN
250.0000 mg | INTRAVENOUS | Status: DC
  Filled 2017-07-23: qty 50

## 2017-07-23 MED ORDER — UMECLIDINIUM-VILANTEROL 62.5-25 MCG/INH IN AEPB
1.0000 | INHALATION_SPRAY | Freq: Every day | RESPIRATORY_TRACT | Status: DC
Start: 2017-07-23 — End: 2017-07-26
  Administered 2017-07-23 – 2017-07-24 (×2): 1 via RESPIRATORY_TRACT
  Filled 2017-07-23 (×2): qty 14

## 2017-07-23 MED ORDER — PREDNISONE 50 MG PO TABS: 50.0000 mg | ORAL_TABLET | Freq: Every day | ORAL | 0 refills | Status: AC

## 2017-07-23 MED ORDER — TRAZODONE HCL 50 MG PO TABS: 25.0000 mg | ORAL_TABLET | Freq: Every evening | ORAL | Status: DC | PRN

## 2017-07-23 MED ORDER — DONEPEZIL HCL 5 MG PO TABS
5.0000 mg | ORAL_TABLET | Freq: Every day | ORAL | Status: DC
Start: 2017-07-23 — End: 2017-07-23

## 2017-07-23 MED ORDER — METHYLPREDNISOLONE SODIUM SUCC 125 MG IJ SOLR
60.0000 mg | Freq: Four times a day (QID) | INTRAMUSCULAR | Status: DC
  Filled 2017-07-23: qty 2

## 2017-07-23 NOTE — H&P (Signed)
[facility] NAME: Holly Clark    MR#:  [id number]  DATE OF BIRTH:  12-Mar-1930  DATE OF ADMISSION:  07/22/2017  PRIMARY CARE PHYSICIAN: Rusty Aus, MD   REQUESTING/REFERRING PHYSICIAN:   CHIEF COMPLAINT:   Chief Complaint  Patient presents with  . Shortness of Breath    HISTORY OF PRESENT ILLNESS: Holly Clark  is a 84 y.o. female with a known history of coronary artery disease, dementia and COPD.  She was recently hospitalized for acute COPD exacerbation, just 2 weeks ago.  She was treated with 3 days of azithromycin in the hospital.  Patient came to emergency room this time, via EMS.  Patient's family noted her to be very short of breath call 911.  When paramedics arrived she was hypoxemic with oxygen saturation in low 80s.  She was given a nebulizer treatment without much improvement.  The arrival to emergency room patient was still hypoxemic and very short of breath.  She was started on BiPAP and given steroids and more nebulizer treatment with good results.  Blood test done in the emergency room show within normal limits WBC.  Creatinine is noted to be slightly elevated at 1.3 which is the baseline for the patient.  Troponin is chronically elevated at 0.3.  CT Angio of the chest is negative for PE, but it shows moderate emphysema and central bronchial thickening.  EKG, reviewed by myself, is noted without any acute changes. Patient is admitted for further evaluation and treatment.   PAST MEDICAL HISTORY:   Past Medical History:  Diagnosis Date  . COPD (chronic obstructive pulmonary disease) ([place])   . Coronary artery disease   . Dementia   . Depression   . Hypothyroidism     PAST SURGICAL HISTORY:  Past Surgical History:  Procedure Laterality Date  . ABDOMINAL HYSTERECTOMY    . CARDIAC SURGERY    . CATARACT EXTRACTION Bilateral 2012  . CORONARY ARTERY BYPASS GRAFT    . ESOPHAGOGASTRODUODENOSCOPY N/A 07/09/2017    Procedure: ESOPHAGOGASTRODUODENOSCOPY (EGD);  Surgeon: Lin Landsman, MD;  Location: [facility] ENDOSCOPY;  Service: Gastroenterology;  Laterality: N/A;  . [place]   patient unsure of which side  . HIP PINNING,CANNULATED Right 05/19/2015   Procedure: CANNULATED HIP PINNING;  Surgeon: Hessie Knows, MD;  Location: [facility] ORS;  Service: Orthopedics;  Laterality: Right;    SOCIAL HISTORY:  Social History   Tobacco Use  . Smoking status: Current Every Day Smoker    Packs/day: 0.25    Types: Cigarettes  . Smokeless tobacco: Never Used  Substance Use Topics  . Alcohol use: Yes    Comment: rare, glass of wine occasionally    FAMILY HISTORY:  Family History  Problem Relation Age of Onset  . Stroke Mother     DRUG ALLERGIES:  Allergies  Allergen Reactions  . Hydrocodone Other (See Comments)  . Oxycodone Hives    REVIEW OF SYSTEMS:   CONSTITUTIONAL: No fever/chills; patient complains of fatigue and generalized weakness.  EYES: No vision injuries.  EARS, NOSE, AND THROAT: No tinnitus or ear pain.  RESPIRATORY: Positive for chronic productive cough, shortness of breath, wheezing; no hemoptysis.  CARDIOVASCULAR: No chest pain, orthopnea, edema.  GASTROINTESTINAL: No nausea, vomiting, diarrhea or abdominal pain.  GENITOURINARY: No dysuria, hematuria.  ENDOCRINE: No polyuria, nocturia,  HEMATOLOGY: No bleeding. SKIN: No rash or lesion. MUSCULOSKELETAL: Has active history of osteoarthritis.   NEUROLOGIC: No focal weakness.  Patient has poor short term memory. PSYCHIATRY: History of anxiety.   MEDICATIONS AT HOME:  Prior to Admission medications   Medication Sig Start Date End Date Taking? Authorizing Provider  albuterol (PROVENTIL HFA;VENTOLIN HFA) 108 (90 BASE) MCG/ACT inhaler Inhale 2 puffs into the lungs every 6 (six) hours as needed for wheezing or shortness of breath. 05/22/15  Yes Epifanio Lesches, MD  ALPRAZolam Duanne Moron) 0.25 MG tablet Take 1 tablet  (0.25 mg total) by mouth 3 (three) times daily as needed for anxiety. 07/14/17  Yes Vaughan Basta, MD  alum & mag hydroxide-simeth (MAALOX/MYLANTA) 200-200-20 MG/5ML suspension Take 30 mLs by mouth every 4 (four) hours as needed for indigestion. 05/22/15  Yes Epifanio Lesches, MD  aspirin EC 81 MG tablet Take 81 mg by mouth daily.   Yes [provider]  buPROPion (WELLBUTRIN) 75 MG tablet Take 75 mg by mouth daily.   Yes [provider]  diltiazem (DILACOR XR) 120 MG 24 hr capsule Take 120 mg by mouth daily.   Yes [provider]  docusate sodium (COLACE) 100 MG capsule Take 1 capsule (100 mg total) by mouth 2 (two) times daily. 05/22/15  Yes Epifanio Lesches, MD  donepezil (ARICEPT) 5 MG tablet Take 5 mg by mouth as needed.    Yes [provider]  ferrous sulfate 325 (65 FE) MG tablet Take 1 tablet (325 mg total) by mouth 2 (two) times daily with a meal. 07/10/17  Yes Vaughan Basta, MD  galantamine (RAZADYNE) 8 MG tablet Take 8 mg by mouth every morning.   Yes [provider]  levalbuterol (XOPENEX) 1.25 MG/3ML nebulizer solution Take 1.25 mg by nebulization every 6 (six) hours as needed for wheezing.   Yes [provider]  levothyroxine (SYNTHROID, LEVOTHROID) 75 MCG tablet Take 1 tablet (75 mcg total) by mouth daily before breakfast. 07/14/17  Yes Vaughan Basta, MD  metoprolol tartrate (LOPRESSOR) 25 MG tablet Take 0.5 tablets (12.5 mg total) by mouth 2 (two) times daily. 07/10/17  Yes Vaughan Basta, MD  nitroGLYCERIN (NITROSTAT) 0.4 MG SL tablet Place 0.4 mg under the tongue every 5 (five) minutes as needed for chest pain.   Yes [provider]  oral electrolytes (THERMOTABS) TABS tablet Take 1 tablet by mouth.   Yes [provider]  pantoprazole (PROTONIX) 40 MG tablet Take 1 tablet (40 mg total) by mouth 2 (two) times daily. 07/10/17  Yes Vaughan Basta, MD  sertraline (ZOLOFT) 50 MG  tablet Take 50 mg by mouth daily.   Yes [provider]  simvastatin (ZOCOR) 20 MG tablet Take 20 mg by mouth at bedtime.   Yes [provider]  telmisartan (MICARDIS) 40 MG tablet Take 40 mg by mouth daily.   Yes [provider]  Umeclidinium-Vilanterol (ANORO ELLIPTA) 62.5-25 MCG/INH AEPB Inhale 1 puff into the lungs daily.   Yes [provider]  HYDROcodone-acetaminophen (NORCO/VICODIN) 5-325 MG tablet Take 1-2 tablets by mouth every 6 (six) hours as needed for moderate pain. Patient not taking: Reported on 07/07/2017 05/22/15   Epifanio Lesches, MD  magnesium hydroxide (MILK OF MAGNESIA) 400 MG/5ML suspension Take 30 mLs by mouth daily as needed for mild constipation. Patient not taking: Reported on 07/07/2017 05/22/15   Epifanio Lesches, MD  predniSONE (STERAPRED UNI-PAK 21 TAB) 10 MG (21) TBPK tablet Take 6 tabs first day, 5 tab on day 2, then 4 on day 3rd, 3 tabs on day 4th , 2 tab on day 5th, and 1 tab on 6th day. Patient  not taking: Reported on 07/22/2017 07/10/17   Vaughan Basta, MD      PHYSICAL EXAMINATION:   VITAL SIGNS: Blood pressure 136/69, pulse 100, temperature 98 F (36.7 C), temperature source Oral, resp. rate 18, weight 44.2 kg (97 lb 7.1 oz), SpO2 100 %.  GENERAL:  84 y.o.-year-old patient lying in the bed, currently in minimal distress, noted to shortness of breath.  She was just taken off BiPAP. EYES: No scleral icterus.   HEENT: Head atraumatic, normocephalic. Oropharynx and nasopharynx clear.  NECK:  Supple, no jugular venous distention. No thyroid enlargement, no tenderness.  LUNGS: Severely reduced breath sounds and scattered wheezing noted bilaterally.  Currently, she looks much improved, with only mild use of accessory muscles of respiration.  CARDIOVASCULAR: S1, S2 normal. No S3/S4.  ABDOMEN: Soft, nontender, nondistended. Bowel sounds present. No organomegaly or mass.  EXTREMITIES: No pedal edema, cyanosis, or  clubbing.  NEUROLOGIC: No focal weakness. Gait not checked, due to patient being very short of breath with ambulation.  PSYCHIATRIC: The patient is alert and oriented x 2.  Short-term memory is noted to be poor. SKIN: No obvious rash, lesion, or ulcer.   LABORATORY PANEL:   CBC Recent Labs  Lab 07/22/17 2055  WBC 9.4  HGB 11.9*  HCT 36.1  PLT 195  MCV 91.4  MCH 30.1  MCHC 33.0  RDW 17.2*  LYMPHSABS 1.6  MONOABS 1.4*  EOSABS 0.2  BASOSABS 0.0   ------------------------------------------------------------------------------------------------------------------  Chemistries  Recent Labs  Lab 07/22/17 2055  NA 141  K 3.6  CL 106  CO2 25  GLUCOSE 112*  BUN 27*  CREATININE 1.30*  CALCIUM 8.8*  AST 25  ALT 37  ALKPHOS 106  BILITOT 0.7   ------------------------------------------------------------------------------------------------------------------ estimated creatinine clearance is 22.1 mL/min (A) (by C-G formula based on SCr of 1.3 mg/dL (H)). ------------------------------------------------------------------------------------------------------------------ No results for input(s): TSH, T4TOTAL, T3FREE, THYROIDAB in the last 72 hours.  Invalid input(s): FREET3   Coagulation profile No results for input(s): INR, PROTIME in the last 168 hours. ------------------------------------------------------------------------------------------------------------------- No results for input(s): DDIMER in the last 72 hours. -------------------------------------------------------------------------------------------------------------------  Cardiac Enzymes Recent Labs  Lab 07/22/17 2055  TROPONINI 0.30*   ------------------------------------------------------------------------------------------------------------------ Invalid input(s): POCBNP  ---------------------------------------------------------------------------------------------------------------  Urinalysis     Component Value Date/Time   COLORURINE YELLOW (A) 05/18/2015 1705   APPEARANCEUR CLEAR (A) 05/18/2015 1705   LABSPEC 1.011 05/18/2015 1705   PHURINE 5.0 05/18/2015 1705   GLUCOSEU NEGATIVE 05/18/2015 1705   HGBUR 1+ (A) 05/18/2015 1705   BILIRUBINUR NEGATIVE 05/18/2015 1705   KETONESUR TRACE (A) 05/18/2015 1705   PROTEINUR NEGATIVE 05/18/2015 1705   NITRITE NEGATIVE 05/18/2015 1705   LEUKOCYTESUR NEGATIVE 05/18/2015 1705     RADIOLOGY: Ct Angio Chest Pe W And/or Wo Contrast  Result Date: 07/22/2017 CLINICAL DATA:  84 year old with increasing shortness of breath. EXAM: CT ANGIOGRAPHY CHEST WITH CONTRAST TECHNIQUE: Multidetector CT imaging of the chest was performed using the standard protocol during bolus administration of intravenous contrast. Multiplanar CT image reconstructions and MIPs were obtained to evaluate the vascular anatomy. CONTRAST:  63mL ISOVUE-370 IOPAMIDOL (ISOVUE-370) INJECTION 76% COMPARISON:  Radiograph earlier this day. FINDINGS: Cardiovascular: There are no filling defects within the pulmonary arteries to suggest pulmonary embolus. Post CABG with calcification of the native coronary arteries. Heart is normal in size. Contrast refluxes into the hepatic veins and IVC. Dense atherosclerosis of the thoracic aorta with calcified and irregular plaque. Without aneurysm. No aortic dissection. No pericardial effusion. Mediastinum/Nodes: Moderate hiatal hernia. No enlarged mediastinal  or hilar nodes. Trachea and mainstem bronchi are patent. Lungs/Pleura: Moderate emphysema and bronchial thickening. Small bilateral pleural effusions, left greater than right, with associated atelectasis. No pneumonic consolidation. No pulmonary edema. No pulmonary mass. Upper Abdomen: Mild contrast refluxing into the IVC and hepatic veins. Irregular plaque of the upper abdominal aorta. Musculoskeletal: Age-indeterminate mild compression fracture superior endplate of I33. No focal bone lesion. Review of  the MIP images confirms the above findings. IMPRESSION: 1. No pulmonary embolus. 2. Moderate emphysema and central bronchial thickening. 3. Small pleural effusions and adjacent atelectasis. 4. Contrast refluxing into the hepatic veins and IVC with mild straightening of the intraventricular septum suggesting elevated right heart pressures. 5. Mild age indeterminate superior endplate compression fracture of T12. 6. Aortic atherosclerosis.  Moderate hiatal hernia. Aortic Atherosclerosis (ICD10-I70.0) and Emphysema (ICD10-J43.9). Electronically Signed   By: Jeb Levering M.D.   On: 07/22/2017 23:31   Dg Chest Portable 1 View  Result Date: 07/22/2017 CLINICAL DATA:  Respiratory distress EXAM: PORTABLE CHEST 1 VIEW COMPARISON:  July 13, 2017 FINDINGS: There is a small hiatal hernia. The heart, hila, mediastinum, lungs, and pleura are unremarkable. IMPRESSION: No active disease. Electronically Signed   By: Dorise Bullion III M.D   On: 07/22/2017 22:04    EKG: Orders placed or performed during the hospital encounter of 07/22/17  . ED EKG  . ED EKG  . EKG 12-Lead  . EKG 12-Lead    IMPRESSION AND PLAN:  1.  Acute hypoxemic respiratory failure, secondary to COPD exacerbation.  Continue treatment with oxygen and BiPAP as needed.  Start nebulizer treatment and steroids IV.  2.  Acute COPD exacerbation, likely related to acute bronchitis.  We will start Levaquin IV.  Continue oxygen therapy, nebulizer treatments and steroids IV. 3.  Chronic elevation in troponin level, due to due to demand ischemia.  No acute findings per EKG. We will continue to monitor on telemetry and follow troponin level. 4.  Diastolic CHF, clinically compensated.  Will continue medical management. 5.  CKD 3, creatinine is at baseline, 1.3.  Continue to monitor kidney function closely and avoid nephrotoxic medications.  All the records are reviewed and case discussed with ED provider. Management plans discussed with the  patient, family and they are in agreement.  CODE STATUS:    Code Status Orders  (From admission, onward)        Start     Ordered   07/23/17 0319  Full code  Continuous     07/23/17 0319    Code Status History    Date Active Date Inactive Code Status Order ID Comments User Context   07/13/2017 19:43 07/14/2017 22:16 Full Code [id number]  Demetrios Loll, MD ED   07/07/2017 03:38 07/11/2017 19:21 Full Code [id number]  Saundra Shelling, MD Inpatient   05/19/2015 15:34 05/22/2015 18:54 Full Code [id number]  Hessie Knows, MD Inpatient   05/18/2015 17:41 05/19/2015 15:34 Full Code [id number]  Loletha Grayer, MD ED   05/18/2015 16:58 05/18/2015 17:41 Full Code [id number]  Hessie Knows, MD ED    Advance Directive Documentation     Most Recent Value  Type of Advance Directive  Healthcare Power of Attorney, Living will  Pre-existing out of facility DNR order (yellow form or pink MOST form)  No data  "MOST" Form in Place?  No data       TOTAL TIME TAKING CARE OF THIS PATIENT: 40 minutes.    Amelia Jo M.D on 07/23/2017 at 5:02 AM  Between 7am to 6pm -  Pager - [phone]  After 6pm go to [email or web address] - password [email or web address] [place] [facility]  Office  [phone]  CC: Primary care physician; Rusty Aus, MD

## 2017-07-23 NOTE — Plan of Care (Signed)
  Progressing Education: Knowledge of General Education information will improve 07/23/2017 0459 - Progressing by Liliane Channel, RN Clinical Measurements: Will remain free from infection 07/23/2017 0459 - Progressing by Liliane Channel, RN Respiratory complications will improve 07/23/2017 0459 - Progressing by Liliane Channel, RN Pain Managment: General experience of comfort will improve 07/23/2017 0459 - Progressing by Liliane Channel, RN Safety: Ability to remain free from injury will improve 07/23/2017 0459 - Progressing by Liliane Channel, RN

## 2017-07-23 NOTE — Care Management Note (Incomplete)
Case Management Note  Patient Details  Name: Holly Clark MRN: [id number] Date of Birth: [birth year]  Subjective/Objective:                    Action/Plan:   Expected Discharge Date:  07/23/17               Expected Discharge Plan:  [place]  In-House Referral:     Discharge planning Services  CM Consult  Post Acute Care Choice:    Choice offered to:  Patient  DME Arranged:    DME Agency:     HH Arranged:  RN, PT [place] Agency:  [place]  Status of Service:  Completed, signed off  If discussed at [place] of Stay Meetings, dates discussed:    Additional Comments:  Holly Clark A, RN 07/23/2017, 12:58 PM

## 2017-07-23 NOTE — Progress Notes (Signed)
Pt removed PIV. This RN was unable to regain access after attempt. IV team c/s placed and Dr. Benjie Karvonen notified via text page.

## 2017-07-23 NOTE — Plan of Care (Signed)
Pt is A&Ox4, but forgetful. VSS . RA . NSR on monitor. Family at bedside. OOB with walker and 2 person assist. No complaints thus far. Will continue to monitor and report to oncoming RN .  Progressing Education: Knowledge of General Education information will improve 07/23/2017 1535 - Progressing by Aleen Campi, RN Health Behavior/Discharge Planning: Ability to manage health-related needs will improve 07/23/2017 1535 - Progressing by Aleen Campi, RN Clinical Measurements: Ability to maintain clinical measurements within normal limits will improve 07/23/2017 1535 - Progressing by Aleen Campi, RN Will remain free from infection 07/23/2017 1535 - Progressing by Aleen Campi, RN Diagnostic test results will improve 07/23/2017 1535 - Progressing by Aleen Campi, RN Respiratory complications will improve 07/23/2017 1535 - Progressing by Aleen Campi, RN Cardiovascular complication will be avoided 07/23/2017 1535 - Progressing by Aleen Campi, RN Activity: Risk for activity intolerance will decrease 07/23/2017 1535 - Progressing by Aleen Campi, RN Nutrition: Adequate nutrition will be maintained 07/23/2017 1535 - Progressing by Aleen Campi, RN Coping: Level of anxiety will decrease 07/23/2017 1535 - Progressing by Aleen Campi, RN Elimination: Will not experience complications related to bowel motility 07/23/2017 1535 - Progressing by Aleen Campi, RN Will not experience complications related to urinary retention 07/23/2017 1535 - Progressing by Aleen Campi, RN Pain Managment: General experience of comfort will improve 07/23/2017 1535 - Progressing by Aleen Campi, RN Safety: Ability to remain free from injury will improve 07/23/2017 1535 - Progressing by Aleen Campi, RN Skin Integrity: Risk for impaired skin integrity will decrease 07/23/2017 1535 - Progressing by Aleen Campi, RN

## 2017-07-23 NOTE — Care Management Note (Signed)
Case Management Note  Patient Details  Name: Holly Clark MRN: [id number] Date of Birth: 04-21-1930  Subjective/Objective:      Per patient choice, a referral was called to [facility] requesting HH=PT, RN. [name] reports that she has a walker at home.               Action/Plan:   Expected Discharge Date:  07/23/17               Expected Discharge Plan:  [place]  In-House Referral:     Discharge planning Services  CM Consult  Post Acute Care Choice:    Choice offered to:  Patient  DME Arranged:    DME Agency:     HH Arranged:  RN, PT [place] Agency:  [place]  Status of Service:  Completed, signed off  If discussed at [place] of Stay Meetings, dates discussed:    Additional Comments:  Dinnis Rog A, RN 07/23/2017, 10:47 AM

## 2017-07-23 NOTE — Care Management Note (Signed)
Case Management Note  Patient Details  Name: Holly Clark MRN: [id number] Date of Birth: 1930-02-26  Subjective/Objective:   Code 83 not on WQ admit and DC on same day 2.17.19                 Action/Plan: text sent to Dr Doy Hutching   Expected Discharge Date:  07/23/17               Expected Discharge Plan:  [place]  In-House Referral:     Discharge planning Services  CM Consult  Post Acute Care Choice:    Choice offered to:  Patient  DME Arranged:    DME Agency:     HH Arranged:  RN, PT [place] Agency:  [place]  Status of Service:  Completed, signed off  If discussed at [place] of Stay Meetings, dates discussed:    Additional Comments:  Ival Bible, RN 07/23/2017, 12:20 PM

## 2017-07-23 NOTE — ED Notes (Signed)
Patient removed from BiPAP per Hospitalist VO, patient oxygen saturation is 95% on RA

## 2017-07-23 NOTE — ED Notes (Signed)
Shanon Brow RN, aware of bed assigned

## 2017-07-23 NOTE — Progress Notes (Addendum)
[facility] NAME: Holly Clark    MR#:  [id number]  DATE OF BIRTH:  [birth year]  SUBJECTIVE:   Patient very eager to go home today.  REVIEW OF SYSTEMS:    Review of Systems  Constitutional: Negative for fever, chills weight loss HENT: Negative for ear pain, nosebleeds, congestion, facial swelling, rhinorrhea, neck pain, neck stiffness and ear discharge.   Respiratory: Positive for cough with improved shortness of breath and no wheezing  Cardiovascular: Negative for chest pain, palpitations and leg swelling.  Gastrointestinal: Negative for heartburn, abdominal pain, vomiting, diarrhea or consitpation Genitourinary: Negative for dysuria, urgency, frequency, hematuria Musculoskeletal: Negative for back pain or joint pain Neurological: Negative for dizziness, seizures, syncope, focal weakness,  numbness and headaches.  Hematological: Does not bruise/bleed easily.  Psychiatric/Behavioral: Negative for hallucinations,  dysphoric mood  She has dementia  Tolerating Diet: yes      DRUG ALLERGIES:   Allergies  Allergen Reactions  . Hydrocodone Other (See Comments)  . Oxycodone Hives    VITALS:  Blood pressure (!) 117/40, pulse 92, temperature 98.7 F (37.1 C), temperature source Oral, resp. rate 18, weight 44.2 kg (97 lb 7.1 oz), SpO2 96 %.  PHYSICAL EXAMINATION:  Constitutional: Appears well-developed and well-nourished. No distress. HENT: Normocephalic. Marland Kitchen Oropharynx is clear and moist.  Eyes: Conjunctivae and EOM are normal. PERRLA, no scleral icterus.  Neck: Normal ROM. Neck supple. No JVD. No tracheal deviation. CVS: RRR, S1/S2 +, no murmurs, no gallops, no carotid bruit.  Pulmonary: Normal effort good air flow and mild bilateral wheezing on expiration  Abdominal: Soft. BS +,  no distension, tenderness, rebound or guarding.  Musculoskeletal: Normal range of motion. No edema and no tenderness.  Neuro: Alert. CN 2-12  grossly intact. No focal deficits. Skin: Skin is warm and dry. No rash noted. Psychiatric: Normal mood and affect.      LABORATORY PANEL:   CBC Recent Labs  Lab 07/23/17 0451  WBC 7.6  HGB 9.9*  HCT 30.1*  PLT 135*   ------------------------------------------------------------------------------------------------------------------  Chemistries  Recent Labs  Lab 07/22/17 2055 07/23/17 0451  NA 141 137  K 3.6 3.5  CL 106 106  CO2 25 19*  GLUCOSE 112* 217*  BUN 27* 24*  CREATININE 1.30* 1.21*  CALCIUM 8.8* 8.0*  AST 25  --   ALT 37  --   ALKPHOS 106  --   BILITOT 0.7  --    ------------------------------------------------------------------------------------------------------------------  Cardiac Enzymes Recent Labs  Lab 07/22/17 2055 07/23/17 0633  TROPONINI 0.30* 0.22*   ------------------------------------------------------------------------------------------------------------------  RADIOLOGY:  Ct Angio Chest Pe W And/or Wo Contrast  Result Date: 07/22/2017 CLINICAL DATA:  84 year old with increasing shortness of breath. shortness of breath. EXAM: CT ANGIOGRAPHY CHEST WITH CONTRAST TECHNIQUE: Multidetector CT imaging of the chest was performed using the standard protocol during bolus administration of intravenous contrast. Multiplanar CT image reconstructions and MIPs were obtained to evaluate the vascular anatomy. CONTRAST:  72mL ISOVUE-370 IOPAMIDOL (ISOVUE-370) INJECTION 76% COMPARISON:  Radiograph earlier this day. FINDINGS: Cardiovascular: There are no filling defects within the pulmonary arteries to suggest pulmonary embolus. Post CABG with calcification of the native coronary arteries. Heart is normal in size. Contrast refluxes into the hepatic veins and IVC. Dense atherosclerosis of the thoracic aorta with calcified and irregular plaque. Without aneurysm. No aortic dissection. No pericardial effusion. Mediastinum/Nodes: Moderate hiatal hernia. No enlarged mediastinal or hilar  nodes. Trachea and mainstem bronchi are patent. Lungs/Pleura: Moderate emphysema and bronchial thickening. Small bilateral  pleural effusions, left greater than right, with associated atelectasis. No pneumonic consolidation. No pulmonary edema. No pulmonary mass. Upper Abdomen: Mild contrast refluxing into the IVC and hepatic veins. Irregular plaque of the upper abdominal aorta. Musculoskeletal: Age-indeterminate mild compression fracture superior endplate of N05. No focal bone lesion. Review of the MIP images confirms the above findings. IMPRESSION: 1. No pulmonary embolus. 2. Moderate emphysema and central bronchial thickening. 3. Small pleural effusions and adjacent atelectasis. 4. Contrast refluxing into the hepatic veins and IVC with mild straightening of the intraventricular septum suggesting elevated right heart pressures. 5. Mild age indeterminate superior endplate compression fracture of T12. 6. Aortic atherosclerosis.  Moderate hiatal hernia. Aortic Atherosclerosis (ICD10-I70.0) and Emphysema (ICD10-J43.9). Electronically Signed   By: Jeb Levering M.D.   On: 07/22/2017 23:31   Dg Chest Portable 1 View  Result Date: 07/22/2017 CLINICAL DATA:  Respiratory distress EXAM: PORTABLE CHEST 1 VIEW COMPARISON:  July 13, 2017 FINDINGS: There is a small hiatal hernia. The heart, hila, mediastinum, lungs, and pleura are unremarkable. IMPRESSION: No active disease. Electronically Signed   By: Dorise Bullion III M.D   On: 07/22/2017 22:04     ASSESSMENT AND PLAN:   84 year old female with a history of CAD, dementia and COPD with 2 hospitalizations this month for COPD exacerbation who presents shortness of breath.  1. Acute hypoxic respiratory failure in the setting of acute exacerbation of COPD with acute bronchitis CT scan did not show evidence of pulmonary emboli or pneumonia Wean oxygen to room air Check oxygen level and ambulation  2. Acute exacerbation COPD with acute bronchitis: Wean  steroids Continue nebulizer and inhalers Continue doxycycline for acute bronchitis 3. Chronic diastolic heart failure with preserved ejection fraction by echocardiogram for very 2019  4. Dementia: Continue Aricept  5. Hypothyroidism: Continue Synthroid  6. Essential hypertension: Continue diltiazem and Avapro and metoprolol  7. Depression: Continue continue Wellbutrin and Zoloft  8. Anemia chronic disease on ferrous sulfate  9. Hyperlipidemia: Continue Zocor  10. Tobacco dependence: Patient is encouraged to quit smoking. Counseling was provided for 4 minutes.   Physical therapy consultation requested for discharge planning  Management plans discussed with the patient and she is in agreement.  CODE STATUS: FULL  TOTAL TIME TAKING CARE OF THIS PATIENT: 30 minutes.     POSSIBLE D/C 1-2 days, DEPENDING ON CLINICAL CONDITION.   Aylissa Heinemann M.D on 07/23/2017 at 10:29 AM  Between 7am to 6pm - Pager - [phone] After 6pm go to [email or web address] - password [email or web address] [email or web address]  Office  [phone]  CC: Primary care physician; Rusty Aus, MD  Note: This dictation was prepared with Dragon dictation along with smaller phrase technology. Any transcriptional errors that result from this process are unintentional.

## 2017-07-23 NOTE — Care Management CC44 (Signed)
Condition Code 44 Documentation Completed  Patient Details  Name: Holly Clark MRN: [id number] Date of Birth: Nov 15, 1929   Condition Code 44 given:  Yes Patient signature on Condition Code 44 notice:  Yes Documentation of 2 MD's agreement:  Yes Code 44 added to claim:  Yes    Kaari Zeigler A, RN 07/23/2017, 12:58 PM

## 2017-07-23 NOTE — Care Management Obs Status (Signed)
[facility] NOTIFICATION   Patient Details  Name: Holly Clark MRN: [id number] Date of Birth: 02-23-1930   Medicare Observation Status Notification Given:  Yes    Cadi Rhinehart A, RN 07/23/2017, 12:58 PM

## 2017-07-24 DIAGNOSIS — J209 Acute bronchitis, unspecified: Secondary | ICD-10-CM | POA: Diagnosis not present

## 2017-07-24 DIAGNOSIS — J441 Chronic obstructive pulmonary disease with (acute) exacerbation: Secondary | ICD-10-CM | POA: Diagnosis not present

## 2017-07-24 DIAGNOSIS — N183 Chronic kidney disease, stage 3 (moderate): Secondary | ICD-10-CM | POA: Diagnosis not present

## 2017-07-24 DIAGNOSIS — J9601 Acute respiratory failure with hypoxia: Secondary | ICD-10-CM | POA: Diagnosis not present

## 2017-07-24 LAB — BLOOD GAS, VENOUS
Acid-base deficit: 0.5 mmol/L (ref 0.0–2.0)
Bicarbonate: 26.6 mmol/L (ref 20.0–28.0)
Delivery systems: POSITIVE
PATIENT TEMPERATURE: 37
pCO2, Ven: 54 mmHg (ref 44.0–60.0)
pH, Ven: 7.3 (ref 7.250–7.430)

## 2017-07-24 LAB — BASIC METABOLIC PANEL
ANION GAP: 10 (ref 5–15)
BUN: 26 mg/dL — ABNORMAL HIGH (ref 6–20)
CALCIUM: 8.8 mg/dL — AB (ref 8.9–10.3)
CO2: 25 mmol/L (ref 22–32)
CREATININE: 1.2 mg/dL — AB (ref 0.44–1.00)
Chloride: 106 mmol/L (ref 101–111)
GFR calc Af Amer: 46 mL/min — ABNORMAL LOW (ref >60)
GFR, EST NON AFRICAN AMERICAN: 40 mL/min — AB (ref >60)
Glucose, Bld: 138 mg/dL — ABNORMAL HIGH (ref 65–99)
Potassium: 3.8 mmol/L (ref 3.5–5.1)
SODIUM: 141 mmol/L (ref 135–145)

## 2017-07-24 LAB — GLUCOSE, CAPILLARY: GLUCOSE-CAPILLARY: 128 mg/dL — AB (ref 65–99)

## 2017-07-24 MED ORDER — FUROSEMIDE 10 MG/ML IJ SOLN
20.0000 mg | Freq: Every day | INTRAMUSCULAR | Status: DC
  Administered 2017-07-25 – 2017-07-26 (×2): 20 mg via INTRAVENOUS
  Filled 2017-07-24 (×2): qty 2

## 2017-07-24 MED ORDER — ENSURE ENLIVE PO LIQD
237.0000 mL | Freq: Two times a day (BID) | ORAL | Status: DC
  Administered 2017-07-25 – 2017-07-26 (×2): 237 mL via ORAL

## 2017-07-24 MED ORDER — FUROSEMIDE 10 MG/ML IJ SOLN
60.0000 mg | Freq: Once | INTRAMUSCULAR | Status: AC
  Administered 2017-07-24: 60 mg via INTRAVENOUS
  Filled 2017-07-24: qty 6

## 2017-07-24 MED ORDER — IPRATROPIUM-ALBUTEROL 0.5-2.5 (3) MG/3ML IN SOLN
3.0000 mL | Freq: Four times a day (QID) | RESPIRATORY_TRACT | Status: DC
  Administered 2017-07-24 – 2017-07-26 (×10): 3 mL via RESPIRATORY_TRACT
  Filled 2017-07-24 (×9): qty 3

## 2017-07-24 NOTE — Progress Notes (Signed)
During rounding patient was very short of breath, lungs sound rhonchi and 02 saturation in mids 80s. MD notified. Given IV lasix once and breathing treatment early. Some immediate relief felt. Within hour patient resting in bed sleeping. Will continue to monitor

## 2017-07-24 NOTE — Progress Notes (Signed)
Physical Therapy Evaluation Patient Details Name: Holly Clark MRN: [id number] DOB: 1930-03-07 Today's Date: 07/24/2017   History of Present Illness  Holly Clark  is a 84 y.o. female with a known history of coronary artery disease, dementia and COPD.  She was recently hospitalized for acute COPD exacerbation, just 2 weeks ago.  She was treated with 3 days of azithromycin in the hospital. Patient came to emergency room this time, via EMS.  Patient's family noted her to be very short of breath call [phone].  When paramedics arrived she was hypoxemic with oxygen saturation in low 80s.  She was given a nebulizer treatment without much improvement. The arrival to emergency room patient was still hypoxemic and very short of breath.  She was started on BiPAP and given steroids and more nebulizer treatment with good results.  Blood test done in the emergency room show within normal limits WBC.  Creatinine is noted to be slightly elevated at 1.3 which is the baseline for the patient.  Troponin is chronically elevated at 0.3.  CT Angio of the chest is negative for PE, but it shows moderate emphysema and central bronchial thickening. Pt currently under observation status for acute hypoxemic respiratory failure secondary to COD exaccerbation and acute bronchitis.   Clinical Impression  Pt admitted with above diagnosis. Pt currently with functional limitations due to the deficits listed below (see PT Problem List). Pt uses bed railing and assist from therapist/RN with bed mobility. HOB elevated and increased time required to come to sitting. Pt requires minA+1 to get to standing with posterior LOB initially. Decreased bilateral LE strength with transfers. She requires modA+2 to get up from low commode in bathroom with transfers. Pt is very unsteady with ambulation with therapist requiring 3-4 episodes of minA+ to prevent her from falling. She continually walks with walker out in front of body requiring repeat cueing to  remain within confines fo walker. Pt becomes very short of breath with ambulation and SaO2 drops to 86% on room air. SaO2 recovers to 97% on 2L/min and remains >95% throughout rest of session. Recommend SNF placement at discharge. Pt will benefit from PT services to address deficits in strength, balance, and mobility in order to return to full function at home.   SaO2 on room air at rest = 97% SaO2 on room air while ambulating = 86% SaO2 on 2 liters of O2 while ambulating = 97%       Follow Up Recommendations SNF    Equipment Recommendations  None recommended by PT    Recommendations for Other Services       Precautions / Restrictions Precautions Precautions: Fall Restrictions Weight Bearing Restrictions: No      Mobility  Bed Mobility Overal bed mobility: Needs Assistance Bed Mobility: Supine to Sit     Supine to sit: Min assist     General bed mobility comments: Pt uses bed railing and assist from therapist/RN. HOB elevated and increased time required to come to sitting  Transfers Overall transfer level: Needs assistance Equipment used: Rolling walker (2 wheeled) Transfers: Sit to/from Stand Sit to Stand: Min assist         General transfer comment: Pt requires minA+1 to get to standing with posterior LOB initially. Decreased bilateral LE strength. She requires modA+2 to get up from low commode in bathroom  Ambulation/Gait Ambulation/Gait assistance: Min assist Ambulation Distance (Feet): 30 Feet Assistive device: Rolling walker (2 wheeled)   Gait velocity: Decreased Gait velocity interpretation: <1.8 ft/sec, indicative of  risk for recurrent falls General Gait Details: Pt is very unsteady with therapist requiring 3-4 episodes where minA+1 required to prevent her from falling. She continually walks with walker out in front of body requiring repeat cueing to remain within confines fo walker. Pt becomes very short of breath with ambulation and SaO2 drops to 86% on  room air. SaO2 recovers to 97% on 2L/min and remains >95% throughout rest of session  Stairs            Wheelchair Mobility    Modified Rankin (Stroke Patients Only)       Balance Overall balance assessment: Needs assistance Sitting-balance support: No upper extremity supported Sitting balance-Leahy Scale: Fair     Standing balance support: Bilateral upper extremity supported Standing balance-Leahy Scale: Poor Standing balance comment: Requires mina+1 to stabilize in standing                             Pertinent Vitals/Pain Pain Assessment: No/denies pain    Home Living Family/patient expects to be discharged to:: Private residence Living Arrangements: Children(Son) Available Help at Discharge: Available 24 hours/day(Son, two daughters also live nearby) Type of Home: House Home Access: Stairs to enter Entrance Stairs-Rails: Left Entrance Stairs-Number of Steps: 3 Home Layout: Able to live on main level with bedroom/bathroom;Two level Home Equipment: Grab bars - toilet;Grab bars - tub/shower;Walker - 2 wheels;Bedside commode;Wheelchair - manual;Tub bench      Prior Function Level of Independence: Needs assistance   Gait / Transfers Assistance Needed: Pt ambulates limited distances in the house with rolling walker. Family reports DOE with ambulation  ADL's / Homemaking Assistance Needed: Has become more reliant on family for ADLs. Daughter states that the son who lives with patient will not assist her with toileting needs due to modesty/discomfort so he does not help her with ambulation in bathroom  Comments: uses RW at all times     Hand Dominance   Dominant Hand: Right    Extremity/Trunk Assessment   Upper Extremity Assessment Upper Extremity Assessment: Overall WFL for tasks assessed    Lower Extremity Assessment Lower Extremity Assessment: Generalized weakness       Communication   Communication: No difficulties  Cognition  Arousal/Alertness: Awake/alert Behavior During Therapy: WFL for tasks assessed/performed Overall Cognitive Status: History of cognitive impairments - at baseline                                 General Comments: History of cognitive impairments but AOx4 at time of evaluation      General Comments      Exercises     Assessment/Plan    PT Assessment Patient needs continued PT services  PT Problem List Decreased strength;Decreased balance;Decreased mobility;Decreased safety awareness       PT Treatment Interventions Gait training;DME instruction;Therapeutic exercise;Balance training;Stair training;Therapeutic activities;Neuromuscular re-education;Cognitive remediation;Patient/family education    PT Goals (Current goals can be found in the Care Plan section)  Acute Rehab PT Goals Patient Stated Goal: Return to prior function  PT Goal Formulation: With patient/family Time For Goal Achievement: 08/07/17 Potential to Achieve Goals: Fair    Frequency Min 2X/week   Barriers to discharge Decreased caregiver support Son who lives with pt won't assist pt in bathroom with toileting    Co-evaluation               AM-PAC PT "6 Clicks" Daily Activity  Outcome Measure Difficulty turning over in bed (including adjusting bedclothes, sheets and blankets)?: A Little Difficulty moving from lying on back to sitting on the side of the bed? : Unable Difficulty sitting down on and standing up from a chair with arms (e.g., wheelchair, bedside commode, etc,.)?: Unable Help needed moving to and from a bed to chair (including a wheelchair)?: A Lot Help needed walking in hospital room?: A Lot Help needed climbing 3-5 steps with a railing? : Total 6 Click Score: 10    End of Session Equipment Utilized During Treatment: Gait belt Activity Tolerance: Patient tolerated treatment well Patient left: in bed;with bed alarm set;with family/visitor present Nurse Communication: Mobility  status;Other (comment)(Needs new external catheter) PT Visit Diagnosis: Unsteadiness on feet (R26.81);Repeated falls (R29.6);Muscle weakness (generalized) (M62.81);History of falling (Z91.81);Difficulty in walking, not elsewhere classified (R26.2)    Time: 9528-4132 PT Time Calculation (min) (ACUTE ONLY): 23 min   Charges:   PT Evaluation $PT Eval Low Complexity: 1 Low PT Treatments $Therapeutic Activity: 8-22 mins   PT G Codes:        Lyndel Safe Huprich PT, DPT    Huprich,Jason 07/24/2017, 11:57 AM

## 2017-07-24 NOTE — Progress Notes (Signed)
Initial Nutrition Assessment  DOCUMENTATION CODES:   Non-severe (moderate) malnutrition in context of chronic illness, Underweight  INTERVENTION:   Liberalize Diet to Regular  Magic Cup BID at meals, each supplement provides 290 kcal and 9 grams of protein  Ensure Enlive po BID between meals, each supplement provides 350 kcal and 20 grams of protein  Feeding assistance   NUTRITION DIAGNOSIS:   Moderate Malnutrition related to chronic illness(COPD, AMS) as evidenced by mild fat depletion, mild muscle depletion.   GOAL:   Patient will meet greater than or equal to 90% of their needs   MONITOR:   PO intake, Supplement acceptance, Weight trends  REASON FOR ASSESSMENT:   Other (Comment)(Low BMI)    ASSESSMENT:   84 yo female admitted 2/16 with acute hypoxic respiratory failure secondary to acute exacerbation of COPD and acute bronchitis; Iron deficiency anemia secondary to chronic cameron lesion. PMH dementia, depression, chronic HF, CKD2,  Hip fracture, erosive esophagitis, HTN, hyperlipidemia.   Percent meals since admission 2/17 = 0%; 2/18 am = 50-75%  S/w RN who reported that pt is not always lucid (drifts in and out), but has been doing "okay" and daughter is concerned about pt's PO intake (quality/quantity).  Pt was sleepy and unable to stay awake for nutrition assessment interview. Daughter was in pt's room. Per daughter pt:  --Lives with son who prepares/buys food (pizza, carryout, ice cream) --Daughter was unable to quantify intake PTA. --Has difficulty eating d/t hands shaking, early satiety, fatigue r/t COPD --AMS/dementia-- forgetful and requires assistance feeding --Stable weight (no hx of weight loss for past few years)  Daughter reports that pt has upper dentures but denies pt has any difficulty chewing nor discomfort with swallowing.   Pt does not take any supplemental vitamins or nutrition at home.  Given pt's progressively poor PO intake, hx of hip  fracture/CKD, recommended supplemental Vit D, calcium, increased protein post discharge as well as supplemental nutrition if pt continues to have poor PO intake.   Pt PO intake inadequate since admission. Daughter reports that pt only eating 5-10 bites or 50% of meals. Pt does not care for some of the offerings on the Heart Healthy diet; she liked sherbet; daughter said pt would like [employer].     Medications:  Prednisone Synthroid Protonix Zoloft IV levaquin Donepezil (side effects include N/V/D, loss of appetite) Vibratabs (side effects include N/V/D) Ferrous sulfate Lasix   Labs:  CBG's 173-128 BNP 1,667 (H) BUN 27 (H) Cr 1.3 (H) HGB 11.9 (L)     NUTRITION - FOCUSED PHYSICAL EXAM:    Most Recent Value  Orbital Region  Moderate depletion  Upper Arm Region  Moderate depletion  Thoracic and Lumbar Region  No depletion  Buccal Region  No depletion  Temple Region  Mild depletion  Clavicle Bone Region  Mild depletion  Clavicle and Acromion Bone Region  Mild depletion  Scapular Bone Region  Unable to assess  Dorsal Hand  Moderate depletion [arthritic hands]  Patellar Region  Mild depletion  Anterior Thigh Region  Mild depletion  Posterior Calf Region  Mild depletion  Edema (RD Assessment)  None  Hair  Reviewed  Eyes  Reviewed  Mouth  Reviewed  Skin  Reviewed  Nails  Reviewed       Diet Order:  Diet Heart Room service appropriate? Yes; Fluid consistency: Thin Diet - low sodium heart healthy  EDUCATION NEEDS:   Education needs have been addressed  Skin:  Skin Assessment: Reviewed RN Assessment  Last BM:  2/17  Height:   Ht Readings from Last 1 Encounters:  07/23/17 5\' 3"  (1.6 m)    Weight:   Wt Readings from Last 1 Encounters:  07/24/17 99 lb 4.8 oz (45 kg)    Ideal Body Weight:  52.2 kg  BMI:  Body mass index is 17.59 kg/m.  Estimated Nutritional Needs:   Kcal:  1,250-1,450 kcal  Protein:  50-60 grams  Fluid:  1.3-1.4 L    Edmonia Lynch, MS Dietetic Intern Pager: [phone]

## 2017-07-24 NOTE — Progress Notes (Signed)
SATURATION QUALIFICATIONS: (This note is used to comply with regulatory documentation for home oxygen)  Patient Saturations on Room Air at Rest = 90%  Patient Saturations on Room Air while Ambulating = 85%  Patient Saturations on 2 Liters of oxygen while Ambulating = 91%  Please briefly explain why patient needs home oxygen:

## 2017-07-24 NOTE — Progress Notes (Signed)
[facility] [place] NAME: Holly Clark    MR#:  [id number]  DATE OF BIRTH:  09-Apr-1930  SUBJECTIVE:  Patient with SOB this am Given Lasix and Neb treatment Family at bedside this am SOB improving   REVIEW OF SYSTEMS:    Review of Systems  Constitutional: Negative for fever, chills weight loss HENT: Negative for ear pain, nosebleeds, congestion, facial swelling, rhinorrhea, neck pain, neck stiffness and ear discharge.   Respiratory: Positive for cough with improved shortness of breath and no wheezing  Cardiovascular: Negative for chest pain, palpitations and leg swelling.  Gastrointestinal: Negative for heartburn, abdominal pain, vomiting, diarrhea or consitpation Genitourinary: Negative for dysuria, urgency, frequency, hematuria Musculoskeletal: Negative for back pain or joint pain Neurological: Negative for dizziness, seizures, syncope, focal weakness,  numbness and headaches.  Hematological: Does not bruise/bleed easily.  Psychiatric/Behavioral: Negative for hallucinations,  dysphoric mood  She has dementia  Tolerating Diet: yes      DRUG ALLERGIES:   Allergies  Allergen Reactions  . Hydrocodone Other (See Comments)  . Oxycodone Hives    VITALS:  Blood pressure 134/84, pulse 73, temperature 98.1 F (36.7 C), temperature source Oral, resp. rate 18, height 5\' 3"  (1.6 m), weight 45 kg (99 lb 4.8 oz), SpO2 98 %.  PHYSICAL EXAMINATION:  Constitutional: Appears well-developed and well-nourished. No distress. HENT: Normocephalic. Marland Kitchen Oropharynx is clear and moist.  Eyes: Conjunctivae and EOM are normal. PERRLA, no scleral icterus.  Neck: Normal ROM. Neck supple. No JVD. No tracheal deviation. CVS: RRR, S1/S2 +, no murmurs, no gallops, no carotid bruit.  Pulmonary: Normal effort good air flow and mild bilateral wheezing on expiration  Abdominal: Soft. BS +,  no distension, tenderness, rebound or guarding.  Musculoskeletal:  Normal range of motion. No edema and no tenderness.  Neuro: Alert. CN 2-12 grossly intact. No focal deficits. Skin: Skin is warm and dry. No rash noted. Psychiatric: Normal mood and affect.      LABORATORY PANEL:   CBC Recent Labs  Lab 07/23/17 0451  WBC 7.6  HGB 9.9*  HCT 30.1*  PLT 135*   ------------------------------------------------------------------------------------------------------------------  Chemistries  Recent Labs  Lab 07/22/17 2055  07/24/17 0527  NA 141   < > 141  K 3.6   < > 3.8  CL 106   < > 106  CO2 25   < > 25  GLUCOSE 112*   < > 138*  BUN 27*   < > 26*  CREATININE 1.30*   < > 1.20*  CALCIUM 8.8*   < > 8.8*  AST 25  --   --   ALT 37  --   --   ALKPHOS 106  --   --   BILITOT 0.7  --   --    < > = values in this interval not displayed.   ------------------------------------------------------------------------------------------------------------------  Cardiac Enzymes Recent Labs  Lab 07/22/17 2055 07/23/17 0633  TROPONINI 0.30* 0.22*   ------------------------------------------------------------------------------------------------------------------  RADIOLOGY:  Ct Angio Chest Pe W And/or Wo Contrast  Result Date: 07/22/2017 CLINICAL DATA:  84 year old with increasing shortness of breath. EXAM: CT ANGIOGRAPHY CHEST WITH CONTRAST TECHNIQUE: Multidetector CT imaging of the chest was performed using the standard protocol during bolus administration of intravenous contrast. Multiplanar CT image reconstructions and MIPs were obtained to evaluate the vascular anatomy. CONTRAST:  68mL ISOVUE-370 IOPAMIDOL (ISOVUE-370) INJECTION 76% COMPARISON:  Radiograph earlier this day. FINDINGS: Cardiovascular: There are no filling defects within the pulmonary arteries  to suggest pulmonary embolus. Post CABG with calcification of the native coronary arteries. Heart is normal in size. Contrast refluxes into the hepatic veins and IVC. Dense atherosclerosis of the  thoracic aorta with calcified and irregular plaque. Without aneurysm. No aortic dissection. No pericardial effusion. Mediastinum/Nodes: Moderate hiatal hernia. No enlarged mediastinal or hilar nodes. Trachea and mainstem bronchi are patent. Lungs/Pleura: Moderate emphysema and bronchial thickening. Small bilateral pleural effusions, left greater than right, with associated atelectasis. No pneumonic consolidation. No pulmonary edema. No pulmonary mass. Upper Abdomen: Mild contrast refluxing into the IVC and hepatic veins. Irregular plaque of the upper abdominal aorta. Musculoskeletal: Age-indeterminate mild compression fracture superior endplate of N27. No focal bone lesion. Review of the MIP images confirms the above findings. IMPRESSION: 1. No pulmonary embolus. 2. Moderate emphysema and central bronchial thickening. 3. Small pleural effusions and adjacent atelectasis. 4. Contrast refluxing into the hepatic veins and IVC with mild straightening of the intraventricular septum suggesting elevated right heart pressures. 5. Mild age indeterminate superior endplate compression fracture of T12. 6. Aortic atherosclerosis.  Moderate hiatal hernia. Aortic Atherosclerosis (ICD10-I70.0) and Emphysema (ICD10-J43.9). Electronically Signed   By: Jeb Levering M.D.   On: 07/22/2017 23:31   Dg Chest Portable 1 View  Result Date: 07/22/2017 CLINICAL DATA:  Respiratory distress EXAM: PORTABLE CHEST 1 VIEW COMPARISON:  July 13, 2017 FINDINGS: There is a small hiatal hernia. The heart, hila, mediastinum, lungs, and pleura are unremarkable. IMPRESSION: No active disease. Electronically Signed   By: Dorise Bullion III M.D   On: 07/22/2017 22:04     ASSESSMENT AND PLAN:   84 year old female with a history of CAD, dementia and COPD with 2 hospitalizations this month for COPD exacerbation who presents shortness of breath.  1. Acute hypoxic respiratory failure in the setting of acute exacerbation of COPD with acute  bronchitis CT scan did not show evidence of pulmonary emboli or pneumonia Wean oxygen to room air if possible   2. Acute exacerbation COPD with acute bronchitis: Continue IVsteroids Continue nebulizer and inhalers Continue doxycycline for acute bronchitis  3. Acute on Chronic diastolic heart failure with preserved ejection fraction by echocardiogram with pEF Continue daily Lasix Monitor intake and output with daily weight Monitor BMP  4. Dementia: Continue Aricept  5. Hypothyroidism: Continue Synthroid  6. Essential hypertension: Continue diltiazem and Avapro and metoprolol  7. Depression: Continue continue Wellbutrin and Zoloft  8. Anemia chronic disease on ferrous sulfate  9. Hyperlipidemia: Continue Zocor  10. Tobacco dependence: Patient is encouraged to quit smoking. Counseling was provided for 4 minutes. 11. Chronic kidney disease stage II: Creatinine remains at baseline  Physical therapy consultation requested for discharge planning  Management plans discussed with the patient and family and she is in agreement.  CODE STATUS: FULL  TOTAL TIME TAKING CARE OF THIS PATIENT: 25 minutes.     POSSIBLE D/C tomorrow DEPENDING ON CLINICAL CONDITION.   Shaniyah Wix M.D on 07/24/2017 at 10:57 AM  Between 7am to 6pm - Pager - [phone] After 6pm go to [email or web address] - password [email or web address] [facility]  Office  [phone]  CC: Primary care physician; Rusty Aus, MD  Note: This dictation was prepared with Dragon dictation along with smaller phrase technology. Any transcriptional errors that result from this process are unintentional.

## 2017-07-24 NOTE — Care Management (Signed)
Patient is currently followed by [facility] RN PT and Aide.  She presents with shortness of breath and found to be hypoxic.  Current oxygen requirement is acute. She required bipap in the ED.  Physical therapy has recommended skilled nursing placement.  Patient says she would rather go home and son supports this decision. Spoke with daughter who wishes for patient to go to skilled nursing facility and says she and her sister has HCPOA.  CM explained that HCPOA is enforce when patient is not able to make her decisions.  Discussed need for family to  to discuss concerns and plan with  patient.  CM informed to go forward with bed search.  CM informed CSW and CM notified [facility]

## 2017-07-25 ENCOUNTER — Ambulatory Visit: Payer: Medicare Other | Admitting: Gastroenterology

## 2017-07-25 ENCOUNTER — Encounter: Payer: Self-pay | Admitting: *Deleted

## 2017-07-25 ENCOUNTER — Encounter
Admission: RE | Admit: 2017-07-25 | Discharge: 2017-07-25 | Disposition: A | Discharge status: Home / Self Care | Payer: Medicare HMO | Source: Ambulatory Visit | Attending: Internal Medicine | Admitting: Internal Medicine

## 2017-07-25 DIAGNOSIS — J441 Chronic obstructive pulmonary disease with (acute) exacerbation: Secondary | ICD-10-CM | POA: Diagnosis not present

## 2017-07-25 DIAGNOSIS — J9601 Acute respiratory failure with hypoxia: Secondary | ICD-10-CM | POA: Diagnosis not present

## 2017-07-25 DIAGNOSIS — J209 Acute bronchitis, unspecified: Secondary | ICD-10-CM | POA: Diagnosis not present

## 2017-07-25 DIAGNOSIS — I509 Heart failure, unspecified: Secondary | ICD-10-CM | POA: Diagnosis not present

## 2017-07-25 DIAGNOSIS — I959 Hypotension, unspecified: Secondary | ICD-10-CM | POA: Insufficient documentation

## 2017-07-25 LAB — BASIC METABOLIC PANEL WITH GFR
Anion gap: 9 (ref 5–15)
BUN: 30 mg/dL — ABNORMAL HIGH (ref 6–20)
CO2: 27 mmol/L (ref 22–32)
Calcium: 8.8 mg/dL — ABNORMAL LOW (ref 8.9–10.3)
Chloride: 104 mmol/L (ref 101–111)
Creatinine, Ser: 1.26 mg/dL — ABNORMAL HIGH (ref 0.44–1.00)
GFR calc Af Amer: 44 mL/min — ABNORMAL LOW
GFR calc non Af Amer: 38 mL/min — ABNORMAL LOW
Glucose, Bld: 126 mg/dL — ABNORMAL HIGH (ref 65–99)
Potassium: 3.9 mmol/L (ref 3.5–5.1)
Sodium: 140 mmol/L (ref 135–145)

## 2017-07-25 LAB — GLUCOSE, CAPILLARY: GLUCOSE-CAPILLARY: 121 mg/dL — AB (ref 65–99)

## 2017-07-25 MED ORDER — FUROSEMIDE 20 MG PO TABS: 20.0000 mg | ORAL_TABLET | Freq: Every day | ORAL | 11 refills | Status: DC

## 2017-07-25 MED ORDER — ALPRAZOLAM 0.25 MG PO TABS: 0.2500 mg | ORAL_TABLET | Freq: Three times a day (TID) | ORAL | 0 refills | Status: DC | PRN

## 2017-07-25 MED ORDER — ENSURE ENLIVE PO LIQD: 237.0000 mL | Freq: Two times a day (BID) | ORAL | 12 refills | Status: DC

## 2017-07-25 MED ORDER — DOXYCYCLINE HYCLATE 100 MG PO TABS: 100.0000 mg | ORAL_TABLET | Freq: Two times a day (BID) | ORAL | 0 refills | Status: AC

## 2017-07-25 NOTE — Progress Notes (Addendum)
Pt has order for d/c to SNF. Per Randall Hiss (Social work) waiting on Biochemist, clinical. MD Mody aware.

## 2017-07-25 NOTE — NC FL2 (Signed)
[place] LEVEL OF CARE SCREENING TOOL     IDENTIFICATION  Patient Name: Holly Clark Birthdate: Aug 24, 1929 Sex: female Admission Date (Current Location): 07/22/2017  [place] and [place] Number:  Engineering geologist and Address:  [facility], [address], [place], [place] [address]      Provider Number: [id number]  Attending Physician Name and Address:  Bettey Costa, MD  Relative Name and Phone Number:  Alycia Rossetti Daughter [phone]  or Drema Balzarine Daughter [phone]  [phone] or Jason Fila [phone]     Current Level of Care: Hospital Recommended Level of Care: [place] Prior Approval Number:    Date Approved/Denied:   PASRR Number: [id number]  Discharge Plan: SNF    Current Diagnoses: Patient Active Problem List   Diagnosis Date Noted  . Acute hypoxemic respiratory failure ([place]) 07/23/2017  . Respiratory failure ([place]) 07/23/2017  . Sinus tachycardia 07/13/2017  . Esophagitis, erosive   . Iron deficiency anemia due to chronic blood loss   . [place] lesion, chronic   . Large hiatal hernia   . COPD exacerbation ([place]) 07/07/2017  . Hip fracture, right ([place]) 05/18/2015    Orientation RESPIRATION BLADDER Height & Weight     Self, Time, Situation, Place  O2(2.5) Continent Weight: 97 lb 3.2 oz (44.1 kg) Height:  5\' 3"  (160 cm)  BEHAVIORAL SYMPTOMS/MOOD NEUROLOGICAL BOWEL NUTRITION STATUS      Continent Diet(Cardiac diet)  AMBULATORY STATUS COMMUNICATION OF NEEDS Skin   Limited Assist Verbally Normal                       Personal Care Assistance Level of Assistance  Bathing, Feeding, Dressing Bathing Assistance: Limited assistance Feeding assistance: Independent Dressing Assistance: Limited assistance     Functional Limitations Info  Sight, Hearing, Speech Sight Info: Adequate Hearing Info: Adequate Speech Info: Adequate    SPECIAL CARE FACTORS FREQUENCY  PT (By  licensed PT)     PT Frequency: 5x a week              Contractures Contractures Info: Not present    Additional Factors Info  Code Status, Allergies Code Status Info: Full Code Allergies Info: HYDROCODONE, OXYCODONE            Current Medications (07/25/2017):  This is the current hospital active medication list Current Facility-Administered Medications  Medication Dose Route Frequency Provider Last Rate Last Dose  . acetaminophen (TYLENOL) tablet 650 mg  650 mg Oral Q6H PRN Amelia Jo, MD       Or  . acetaminophen (TYLENOL) suppository 650 mg  650 mg Rectal Q6H PRN Amelia Jo, MD      . ALPRAZolam Duanne Moron) tablet 0.25 mg  0.25 mg Oral TID PRN Amelia Jo, MD      . alum & mag hydroxide-simeth (MAALOX/MYLANTA) 200-200-20 MG/5ML suspension 30 mL  30 mL Oral Q4H PRN Amelia Jo, MD      . aspirin EC tablet 81 mg  81 mg Oral Daily Amelia Jo, MD   81 mg at 07/25/17 3419  . bisacodyl (DULCOLAX) EC tablet 5 mg  5 mg Oral Daily PRN Amelia Jo, MD      . buPROPion [facility]) tablet 75 mg  75 mg Oral Daily Amelia Jo, MD   75 mg at 07/25/17 0854  . diltiazem (CARDIZEM CD) 24 hr capsule 120 mg  120 mg Oral Daily Amelia Jo, MD   120 mg at 07/25/17 0937  .  docusate sodium (COLACE) capsule 100 mg  100 mg Oral BID Amelia Jo, MD   100 mg at 07/25/17 0853  . donepezil (ARICEPT) tablet 5 mg  5 mg Oral QHS Amelia Jo, MD   5 mg at 07/24/17 2203  . doxycycline (VIBRA-TABS) tablet 100 mg  100 mg Oral Q12H Mody, Sital, MD   100 mg at 07/25/17 0852  . feeding supplement (ENSURE ENLIVE) (ENSURE ENLIVE) liquid 237 mL  237 mL Oral BID BM Mody, Sital, MD      . ferrous sulfate tablet 325 mg  325 mg Oral BID WC Amelia Jo, MD   325 mg at 07/25/17 4037  . furosemide (LASIX) injection 20 mg  20 mg Intravenous Daily Bettey Costa, MD   20 mg at 07/25/17 0856  . heparin injection 5,000 Units  5,000 Units Subcutaneous Q8H Amelia Jo, MD   5,000 Units at 07/25/17 0523  .  ipratropium-albuterol (DUONEB) 0.5-2.5 (3) MG/3ML nebulizer solution 3 mL  3 mL Nebulization Q6H Amelia Jo, MD   3 mL at 07/25/17 0820  . irbesartan (AVAPRO) tablet 75 mg  75 mg Oral Daily Amelia Jo, MD   75 mg at 07/25/17 0853  . levothyroxine (SYNTHROID, LEVOTHROID) tablet 75 mcg  75 mcg Oral QAC breakfast Amelia Jo, MD   75 mcg at 07/25/17 [phone]  . methylPREDNISolone sodium succinate (SOLU-MEDROL) 40 mg/mL injection 40 mg  40 mg Intravenous Q12H Bettey Costa, MD   40 mg at 07/25/17 0523  . metoprolol tartrate (LOPRESSOR) tablet 12.5 mg  12.5 mg Oral BID Amelia Jo, MD   12.5 mg at 07/25/17 0855  . nitroGLYCERIN (NITROSTAT) SL tablet 0.4 mg  0.4 mg Sublingual Q5 min PRN Amelia Jo, MD      . ondansetron [facility]) tablet 4 mg  4 mg Oral Q6H PRN Amelia Jo, MD       Or  . ondansetron [facility]) injection 4 mg  4 mg Intravenous Q6H PRN Amelia Jo, MD      . pantoprazole (PROTONIX) EC tablet 40 mg  40 mg Oral BID Amelia Jo, MD   40 mg at 07/25/17 0853  . sertraline (ZOLOFT) tablet 50 mg  50 mg Oral Daily Amelia Jo, MD   50 mg at 07/25/17 0852  . simvastatin (ZOCOR) tablet 20 mg  20 mg Oral QHS Amelia Jo, MD   20 mg at 07/24/17 2203  . traZODone (DESYREL) tablet 25 mg  25 mg Oral QHS PRN Amelia Jo, MD      . umeclidinium-vilanterol [facility] ELLIPTA) 62.5-25 MCG/INH 1 puff  1 puff Inhalation Daily Amelia Jo, MD   1 puff at 07/24/17 1036     Discharge Medications: Please see discharge summary for a list of discharge medications.  Relevant Imaging Results:  Relevant Lab Results:   Additional Information SSN [id number]  Ross Ludwig, [place]

## 2017-07-25 NOTE — Clinical Social Work Note (Signed)
Clinical Social Work Assessment  Patient Details  Name: TALICIA SUI MRN: [id number] Date of Birth: November 17, 1929  Date of referral:  07/25/17               Reason for consult:  Facility Placement                Permission sought to share information with:  Facility Sport and exercise psychologist, Family Supports Permission granted to share information::  Yes, Verbal Permission Granted  Name::     [facility] Daughter [phone] or Drema Balzarine Daughter [phone]  [phone] or Jason Fila [phone]   Agency::  SNF admissions  Relationship::     Contact Information:     Housing/Transportation Living arrangements for the past 2 months:  Single Family Home Source of Information:  Patient, Adult Children Patient Interpreter Needed:  None Criminal Activity/Legal Involvement Pertinent to Current Situation/Hospitalization:  No - Comment as needed Significant Relationships:  Adult Children Lives with:  Adult Children Do you feel safe going back to the place where you live?  No Need for family participation in patient care:  No (Coment)  Care giving concerns:  Patient's daughter feels she needs some short term rehab first before she is able to return back home.   Social Worker assessment / plan:  Patient is an 84 year old female who is alert and oriented x4.  Patient states she has been to SNF before, she expressed she does not really want to go again, but she is agreeable with going to SNF.  CSW spoke to patient and expressed the advantages of going to a SNF instead of going home with home health.  Patient stated she does not want to go [place] a [place] out of the county, but she is agreeable to going to a SNF in [place], and would prefer [employer].  CSW was given permission to begin bed search in [place].  Patient and her daughter who was at bedside did not express any other questions or concerns.  Employment status:  Retired Nurse, adult PT  Recommendations:  [place] / Referral to community resources:  [place]  Patient/Family's Response to care:  Patient is hesitant but agreeable to go to SNF for short term rehab.  Patient/Family's Understanding of and Emotional Response to Diagnosis, Current Treatment, and Prognosis:  Patient is hopeful she does not have to be at [facility] for very long.  Emotional Assessment Appearance:  Appears stated age Attitude/Demeanor/Rapport:    Affect (typically observed):  Stable, Overwhelmed Orientation:  Oriented to Self, Oriented to Place, Oriented to  Time, Oriented to Situation Alcohol / Substance use:  Not Applicable Psych involvement (Current and /or in the community):  No (Comment)  Discharge Needs  Concerns to be addressed:  Care Coordination Readmission within the last 30 days:  No Current discharge risk:  Cognitively Impaired Barriers to Discharge:  Ship broker, Continued Medical Work up   [facility], [place] 07/25/2017, 3:10 PM

## 2017-07-25 NOTE — Clinical Social Work Note (Signed)
CSW presented bed offers and patient and daughter chose [facility].  CSW spoke to [place] they will begin insurance authorization.  Jones Broom. [place], MSW, [place]  07/25/2017 2:19 PM

## 2017-07-25 NOTE — Care Management (Signed)
Patient to discharge to SNF rather than home with home health through [place].  Notified Alvis Lemmings that anticipate patient to discharge to [facility]

## 2017-07-25 NOTE — Discharge Summary (Addendum)
[facility] [place] NAME: Holly Clark    MR#:  [id number]  DATE OF BIRTH:  1930/01/11  DATE OF ADMISSION:  07/22/2017 ADMITTING PHYSICIAN: Amelia Jo, MD  DATE OF DISCHARGE: 07/26/2017  PRIMARY CARE PHYSICIAN: Rusty Aus, MD    ADMISSION DIAGNOSIS:  COPD exacerbation ([place]) [J44.1] Acute respiratory failure with hypoxia ([place]) [J96.01]  DISCHARGE DIAGNOSIS:  Active Problems:   Acute hypoxemic respiratory failure ([place])   Respiratory failure ([place])   SECONDARY DIAGNOSIS:   Past Medical History:  Diagnosis Date  . COPD (chronic obstructive pulmonary disease) ([place])   . Coronary artery disease   . Dementia   . Depression   . Diastolic heart failure ([place])   . Hypothyroidism     HOSPITAL COURSE:   84 year old female with a history of CAD, dementia and COPD with 2 hospitalizations this month for COPD exacerbation who presents shortness of breath.  1. Acute hypoxic respiratory failure in the setting of acute exacerbation of COPD with acute bronchitis CT scan did not show evidence of pulmonary emboli or pneumonia She will need oxygen at discharge  2. Acute exacerbation COPD with acute bronchitis:  she will continue with oral steroids and nebulizers. She will continue doxycycline for acute bronchitis  3. Acute on Chronic diastolic heart failure with preserved ejection fraction by echocardiogram with pEF Continue daily Lasix CHF referral upon discharge  4. Dementia: Continue Aricept  5. Hypothyroidism: Continue Synthroid  6. Essential hypertension: Continue diltiazem and Avapro and metoprolol  7. Depression: Continue continue Wellbutrin and Zoloft  8. Anemia chronic disease on ferrous sulfate  9. Hyperlipidemia: Continue Zocor  10. Tobacco dependence: Patient is encouraged to quit smoking. Counseling was provided for 4 minutes. 11. Chronic kidney disease stage II: Creatinine remains at baseline    DISCHARGE  CONDITIONS AND DIET:   Stable for discharge on heart healthy diet  CONSULTS OBTAINED:    DRUG ALLERGIES:   Allergies  Allergen Reactions  . Hydrocodone Other (See Comments)  . Oxycodone Hives    DISCHARGE MEDICATIONS:   Allergies as of 07/26/2017      Reactions   Hydrocodone Other (See Comments)   Oxycodone Hives      Medication List    STOP taking these medications   HYDROcodone-acetaminophen 5-325 MG tablet Commonly known as:  NORCO/VICODIN   magnesium hydroxide 400 MG/5ML suspension Commonly known as:  MILK OF MAGNESIA   oral electrolytes Tabs tablet   predniSONE 10 MG (21) Tbpk tablet Commonly known as:  STERAPRED UNI-PAK 21 TAB Replaced by:  predniSONE 50 MG tablet     TAKE these medications   albuterol 108 (90 Base) MCG/ACT inhaler Commonly known as:  PROVENTIL HFA;VENTOLIN HFA Inhale 2 puffs into the lungs every 6 (six) hours as needed for wheezing or shortness of breath.   ALPRAZolam 0.25 MG tablet Commonly known as:  XANAX Take 1 tablet (0.25 mg total) by mouth 3 (three) times daily as needed for anxiety.   alum & mag hydroxide-simeth 200-200-20 MG/5ML suspension Commonly known as:  MAALOX/MYLANTA Take 30 mLs by mouth every 4 (four) hours as needed for indigestion.   ANORO ELLIPTA 62.5-25 MCG/INH Aepb Generic drug:  umeclidinium-vilanterol Inhale 1 puff into the lungs daily.   aspirin EC 81 MG tablet Take 81 mg by mouth daily.   buPROPion 75 MG tablet Commonly known as:  WELLBUTRIN Take 75 mg by mouth daily.   diltiazem 120 MG 24 hr capsule Commonly known as:  DILACOR  XR Take 120 mg by mouth daily.   docusate sodium 100 MG capsule Commonly known as:  COLACE Take 1 capsule (100 mg total) by mouth 2 (two) times daily.   donepezil 5 MG tablet Commonly known as:  ARICEPT Take 5 mg by mouth as needed.   doxycycline 100 MG tablet Commonly known as:  VIBRA-TABS Take 1 tablet (100 mg total) by mouth every 12 (twelve) hours for 5 days.    feeding supplement (ENSURE ENLIVE) Liqd Take 237 mLs by mouth 2 (two) times daily between meals.   ferrous sulfate 325 (65 FE) MG tablet Take 1 tablet (325 mg total) by mouth 2 (two) times daily with a meal.   furosemide 20 MG tablet Commonly known as:  LASIX Take 1 tablet (20 mg total) by mouth daily.   galantamine 8 MG tablet Commonly known as:  RAZADYNE Take 8 mg by mouth every morning.   levalbuterol 1.25 MG/3ML nebulizer solution Commonly known as:  XOPENEX Take 1.25 mg by nebulization every 6 (six) hours as needed for wheezing.   levothyroxine 75 MCG tablet Commonly known as:  SYNTHROID, LEVOTHROID Take 1 tablet (75 mcg total) by mouth daily before breakfast.   metoprolol tartrate 25 MG tablet Commonly known as:  LOPRESSOR Take 0.5 tablets (12.5 mg total) by mouth 2 (two) times daily.   nitroGLYCERIN 0.4 MG SL tablet Commonly known as:  NITROSTAT Place 0.4 mg under the tongue every 5 (five) minutes as needed for chest pain.   pantoprazole 40 MG tablet Commonly known as:  PROTONIX Take 1 tablet (40 mg total) by mouth 2 (two) times daily.   predniSONE 50 MG tablet Commonly known as:  DELTASONE Take 1 tablet (50 mg total) by mouth daily with breakfast for 5 days. Replaces:  predniSONE 10 MG (21) Tbpk tablet   sertraline 50 MG tablet Commonly known as:  ZOLOFT Take 50 mg by mouth daily.   simvastatin 20 MG tablet Commonly known as:  ZOCOR Take 20 mg by mouth at bedtime.   telmisartan 40 MG tablet Commonly known as:  MICARDIS Take 40 mg by mouth daily.            Durable Medical Equipment  (From admission, onward)        Start     Ordered   07/23/17 1027  DME Oxygen  Once    Question Answer Comment  Mode or (Route) Nasal cannula   Liters per Minute 2   Frequency Continuous (stationary and portable oxygen unit needed)   Oxygen conserving device Yes   Oxygen delivery system Gas      07/23/17 1027        Today   CHIEF COMPLAINT:    Patient doing better this morning. Had still has a cough but no wheezing   VITAL SIGNS:  Blood pressure (!) 142/70, pulse 70, temperature 98.1 F (36.7 C), temperature source Oral, resp. rate 18, height 5\' 3"  (1.6 m), weight 43.9 kg (96 lb 12.8 oz), SpO2 100 %.   REVIEW OF SYSTEMS:  Review of Systems  Constitutional: Negative.  Negative for chills, fever and malaise/fatigue.  HENT: Negative.  Negative for ear discharge, ear pain, hearing loss, nosebleeds and sore throat.   Eyes: Negative.  Negative for blurred vision and pain.  Respiratory: Positive for cough. Negative for shortness of breath and wheezing.   Cardiovascular: Negative.  Negative for chest pain, palpitations and leg swelling.  Gastrointestinal: Negative.  Negative for abdominal pain, blood in stool, diarrhea, nausea and  vomiting.  Genitourinary: Negative.  Negative for dysuria.  Musculoskeletal: Negative.  Negative for back pain.  Skin: Negative.   Neurological: Negative for dizziness, tremors, speech change, focal weakness, seizures and headaches.  Endo/Heme/Allergies: Negative.  Does not bruise/bleed easily.  Psychiatric/Behavioral: Positive for memory loss. Negative for depression, hallucinations and suicidal ideas.     PHYSICAL EXAMINATION:  GENERAL:  84 y.o.-year-old patient lying in the bed with no acute distress.  NECK:  Supple, no jugular venous distention. No thyroid enlargement, no tenderness.  LUNGS: Normal breath sounds bilaterally, no wheezing, rales,rhonchi  No use of accessory muscles of respiration.  CARDIOVASCULAR: S1, S2 normal. No murmurs, rubs, or gallops.  ABDOMEN: Soft, non-tender, non-distended. Bowel sounds present. No organomegaly or mass.  EXTREMITIES: No pedal edema, cyanosis, or clubbing.  PSYCHIATRIC: The patient is alert and oriented x 3.  SKIN: No obvious rash, lesion, or ulcer.   DATA REVIEW:   CBC Recent Labs  Lab 07/23/17 0451  WBC 7.6  HGB 9.9*  HCT 30.1*  PLT 135*     Chemistries  Recent Labs  Lab 07/22/17 2055  07/25/17 0535  NA 141   < > 140  K 3.6   < > 3.9  CL 106   < > 104  CO2 25   < > 27  GLUCOSE 112*   < > 126*  BUN 27*   < > 30*  CREATININE 1.30*   < > 1.26*  CALCIUM 8.8*   < > 8.8*  AST 25  --   --   ALT 37  --   --   ALKPHOS 106  --   --   BILITOT 0.7  --   --    < > = values in this interval not displayed.    Cardiac Enzymes Recent Labs  Lab 07/22/17 2055 07/23/17 0633  TROPONINI 0.30* 0.22*    Microbiology Results  @MICRORSLT48 @  RADIOLOGY:  No results found.    Allergies as of 07/26/2017      Reactions   Hydrocodone Other (See Comments)   Oxycodone Hives      Medication List    STOP taking these medications   HYDROcodone-acetaminophen 5-325 MG tablet Commonly known as:  NORCO/VICODIN   magnesium hydroxide 400 MG/5ML suspension Commonly known as:  MILK OF MAGNESIA   oral electrolytes Tabs tablet   predniSONE 10 MG (21) Tbpk tablet Commonly known as:  STERAPRED UNI-PAK 21 TAB Replaced by:  predniSONE 50 MG tablet     TAKE these medications   albuterol 108 (90 Base) MCG/ACT inhaler Commonly known as:  PROVENTIL HFA;VENTOLIN HFA Inhale 2 puffs into the lungs every 6 (six) hours as needed for wheezing or shortness of breath.   ALPRAZolam 0.25 MG tablet Commonly known as:  XANAX Take 1 tablet (0.25 mg total) by mouth 3 (three) times daily as needed for anxiety.   alum & mag hydroxide-simeth 200-200-20 MG/5ML suspension Commonly known as:  MAALOX/MYLANTA Take 30 mLs by mouth every 4 (four) hours as needed for indigestion.   ANORO ELLIPTA 62.5-25 MCG/INH Aepb Generic drug:  umeclidinium-vilanterol Inhale 1 puff into the lungs daily.   aspirin EC 81 MG tablet Take 81 mg by mouth daily.   buPROPion 75 MG tablet Commonly known as:  WELLBUTRIN Take 75 mg by mouth daily.   diltiazem 120 MG 24 hr capsule Commonly known as:  DILACOR XR Take 120 mg by mouth daily.   docusate sodium 100 MG  capsule Commonly known as:  COLACE Take  1 capsule (100 mg total) by mouth 2 (two) times daily.   donepezil 5 MG tablet Commonly known as:  ARICEPT Take 5 mg by mouth as needed.   doxycycline 100 MG tablet Commonly known as:  VIBRA-TABS Take 1 tablet (100 mg total) by mouth every 12 (twelve) hours for 5 days.   feeding supplement (ENSURE ENLIVE) Liqd Take 237 mLs by mouth 2 (two) times daily between meals.   ferrous sulfate 325 (65 FE) MG tablet Take 1 tablet (325 mg total) by mouth 2 (two) times daily with a meal.   furosemide 20 MG tablet Commonly known as:  LASIX Take 1 tablet (20 mg total) by mouth daily.   galantamine 8 MG tablet Commonly known as:  RAZADYNE Take 8 mg by mouth every morning.   levalbuterol 1.25 MG/3ML nebulizer solution Commonly known as:  XOPENEX Take 1.25 mg by nebulization every 6 (six) hours as needed for wheezing.   levothyroxine 75 MCG tablet Commonly known as:  SYNTHROID, LEVOTHROID Take 1 tablet (75 mcg total) by mouth daily before breakfast.   metoprolol tartrate 25 MG tablet Commonly known as:  LOPRESSOR Take 0.5 tablets (12.5 mg total) by mouth 2 (two) times daily.   nitroGLYCERIN 0.4 MG SL tablet Commonly known as:  NITROSTAT Place 0.4 mg under the tongue every 5 (five) minutes as needed for chest pain.   pantoprazole 40 MG tablet Commonly known as:  PROTONIX Take 1 tablet (40 mg total) by mouth 2 (two) times daily.   predniSONE 50 MG tablet Commonly known as:  DELTASONE Take 1 tablet (50 mg total) by mouth daily with breakfast for 5 days. Replaces:  predniSONE 10 MG (21) Tbpk tablet   sertraline 50 MG tablet Commonly known as:  ZOLOFT Take 50 mg by mouth daily.   simvastatin 20 MG tablet Commonly known as:  ZOCOR Take 20 mg by mouth at bedtime.   telmisartan 40 MG tablet Commonly known as:  MICARDIS Take 40 mg by mouth daily.            Durable Medical Equipment  (From admission, onward)        Start      Ordered   07/23/17 1027  DME Oxygen  Once    Question Answer Comment  Mode or (Route) Nasal cannula   Liters per Minute 2   Frequency Continuous (stationary and portable oxygen unit needed)   Oxygen conserving device Yes   Oxygen delivery system Gas      07/23/17 1027        Management plans discussed with the patient and she is in agreement. Stable for discharge   Patient should follow up with pcp  CODE STATUS:     Code Status Orders  (From admission, onward)        Start     Ordered   07/23/17 0319  Full code  Continuous     07/23/17 0319    Code Status History    Date Active Date Inactive Code Status Order ID Comments User Context   07/13/2017 19:43 07/14/2017 22:16 Full Code [id number]  Demetrios Loll, MD ED   07/07/2017 03:38 07/11/2017 19:21 Full Code [id number]  Saundra Shelling, MD Inpatient   05/19/2015 15:34 05/22/2015 18:54 Full Code [id number]  Hessie Knows, MD Inpatient   05/18/2015 17:41 05/19/2015 15:34 Full Code [id number]  Loletha Grayer, MD ED   05/18/2015 16:58 05/18/2015 17:41 Full Code [id number]  Hessie Knows, MD ED    Advance Directive Documentation  Most Recent Value  Type of Advance Directive  Healthcare Power of Attorney, Living will  Pre-existing out of facility DNR order (yellow form or pink MOST form)  No data  "MOST" Form in Place?  No data      TOTAL TIME TAKING CARE OF THIS PATIENT: 38 minutes.    Note: This dictation was prepared with Dragon dictation along with smaller phrase technology. Any transcriptional errors that result from this process are unintentional.  Chesney Suares M.D on 07/26/2017 at 11:16 AM  Between 7am to 6pm - Pager - [phone] After 6pm go to [email or web address] - password [email or web address] [facility]  Office  [phone]  CC: Primary care physician; Rusty Aus, MD

## 2017-07-26 DIAGNOSIS — G9341 Metabolic encephalopathy: Secondary | ICD-10-CM | POA: Diagnosis not present

## 2017-07-26 DIAGNOSIS — I10 Essential (primary) hypertension: Secondary | ICD-10-CM | POA: Diagnosis not present

## 2017-07-26 DIAGNOSIS — I4892 Unspecified atrial flutter: Secondary | ICD-10-CM | POA: Diagnosis not present

## 2017-07-26 DIAGNOSIS — J449 Chronic obstructive pulmonary disease, unspecified: Secondary | ICD-10-CM | POA: Diagnosis not present

## 2017-07-26 DIAGNOSIS — I110 Hypertensive heart disease with heart failure: Secondary | ICD-10-CM | POA: Diagnosis not present

## 2017-07-26 DIAGNOSIS — Z823 Family history of stroke: Secondary | ICD-10-CM | POA: Diagnosis not present

## 2017-07-26 DIAGNOSIS — I959 Hypotension, unspecified: Secondary | ICD-10-CM | POA: Diagnosis not present

## 2017-07-26 DIAGNOSIS — I509 Heart failure, unspecified: Secondary | ICD-10-CM | POA: Diagnosis not present

## 2017-07-26 DIAGNOSIS — R1312 Dysphagia, oropharyngeal phase: Secondary | ICD-10-CM | POA: Diagnosis not present

## 2017-07-26 DIAGNOSIS — E785 Hyperlipidemia, unspecified: Secondary | ICD-10-CM | POA: Diagnosis not present

## 2017-07-26 DIAGNOSIS — I2510 Atherosclerotic heart disease of native coronary artery without angina pectoris: Secondary | ICD-10-CM | POA: Diagnosis not present

## 2017-07-26 DIAGNOSIS — I4891 Unspecified atrial fibrillation: Secondary | ICD-10-CM | POA: Diagnosis not present

## 2017-07-26 DIAGNOSIS — N182 Chronic kidney disease, stage 2 (mild): Secondary | ICD-10-CM | POA: Diagnosis not present

## 2017-07-26 DIAGNOSIS — Z79899 Other long term (current) drug therapy: Secondary | ICD-10-CM | POA: Diagnosis not present

## 2017-07-26 DIAGNOSIS — F0390 Unspecified dementia without behavioral disturbance: Secondary | ICD-10-CM | POA: Diagnosis not present

## 2017-07-26 DIAGNOSIS — Z87891 Personal history of nicotine dependence: Secondary | ICD-10-CM | POA: Diagnosis not present

## 2017-07-26 DIAGNOSIS — I25810 Atherosclerosis of coronary artery bypass graft(s) without angina pectoris: Secondary | ICD-10-CM | POA: Diagnosis not present

## 2017-07-26 DIAGNOSIS — F329 Major depressive disorder, single episode, unspecified: Secondary | ICD-10-CM | POA: Diagnosis not present

## 2017-07-26 DIAGNOSIS — Z885 Allergy status to narcotic agent status: Secondary | ICD-10-CM | POA: Diagnosis not present

## 2017-07-26 DIAGNOSIS — I5032 Chronic diastolic (congestive) heart failure: Secondary | ICD-10-CM | POA: Diagnosis not present

## 2017-07-26 DIAGNOSIS — Z951 Presence of aortocoronary bypass graft: Secondary | ICD-10-CM | POA: Diagnosis not present

## 2017-07-26 DIAGNOSIS — I130 Hypertensive heart and chronic kidney disease with heart failure and stage 1 through stage 4 chronic kidney disease, or unspecified chronic kidney disease: Secondary | ICD-10-CM | POA: Diagnosis not present

## 2017-07-26 DIAGNOSIS — M6281 Muscle weakness (generalized): Secondary | ICD-10-CM | POA: Diagnosis not present

## 2017-07-26 DIAGNOSIS — R262 Difficulty in walking, not elsewhere classified: Secondary | ICD-10-CM | POA: Diagnosis not present

## 2017-07-26 DIAGNOSIS — E039 Hypothyroidism, unspecified: Secondary | ICD-10-CM | POA: Diagnosis not present

## 2017-07-26 DIAGNOSIS — J441 Chronic obstructive pulmonary disease with (acute) exacerbation: Secondary | ICD-10-CM | POA: Diagnosis not present

## 2017-07-26 DIAGNOSIS — E782 Mixed hyperlipidemia: Secondary | ICD-10-CM | POA: Diagnosis not present

## 2017-07-26 DIAGNOSIS — I471 Supraventricular tachycardia: Secondary | ICD-10-CM | POA: Diagnosis not present

## 2017-07-26 DIAGNOSIS — Z7401 Bed confinement status: Secondary | ICD-10-CM | POA: Diagnosis not present

## 2017-07-26 DIAGNOSIS — Z7982 Long term (current) use of aspirin: Secondary | ICD-10-CM | POA: Diagnosis not present

## 2017-07-26 DIAGNOSIS — Z9889 Other specified postprocedural states: Secondary | ICD-10-CM | POA: Diagnosis not present

## 2017-07-26 DIAGNOSIS — I739 Peripheral vascular disease, unspecified: Secondary | ICD-10-CM | POA: Diagnosis not present

## 2017-07-26 DIAGNOSIS — J440 Chronic obstructive pulmonary disease with acute lower respiratory infection: Secondary | ICD-10-CM | POA: Diagnosis not present

## 2017-07-26 DIAGNOSIS — J209 Acute bronchitis, unspecified: Secondary | ICD-10-CM | POA: Diagnosis not present

## 2017-07-26 DIAGNOSIS — J9601 Acute respiratory failure with hypoxia: Secondary | ICD-10-CM | POA: Diagnosis not present

## 2017-07-26 DIAGNOSIS — I480 Paroxysmal atrial fibrillation: Secondary | ICD-10-CM | POA: Diagnosis not present

## 2017-07-26 DIAGNOSIS — Z90710 Acquired absence of both cervix and uterus: Secondary | ICD-10-CM | POA: Diagnosis not present

## 2017-07-26 DIAGNOSIS — M25551 Pain in right hip: Secondary | ICD-10-CM | POA: Diagnosis not present

## 2017-07-26 DIAGNOSIS — M549 Dorsalgia, unspecified: Secondary | ICD-10-CM | POA: Diagnosis not present

## 2017-07-26 LAB — GLUCOSE, CAPILLARY
GLUCOSE-CAPILLARY: 120 mg/dL — AB (ref 65–99)
Glucose-Capillary: 163 mg/dL — ABNORMAL HIGH (ref 65–99)

## 2017-07-26 NOTE — Progress Notes (Signed)
SATURATION QUALIFICATIONS: (This note is used to comply with regulatory documentation for home oxygen)  Patient Saturations on Room Air at Rest = 100%  Patient Saturations on Room Air while Ambulating = 95%  Patient Saturations on n/a Liters of oxygen while Ambulating = n//a%  Please briefly explain why patient needs home oxygen:

## 2017-07-26 NOTE — Progress Notes (Signed)
Kendrick Fries to be D/C'd Skilled nursing facility [facility]) per MD order.  Discussed prescriptions and follow up appointments with the patient and daughter. Prescriptions given to patient, medication list explained in detail. Pt verbalized understanding.  Allergies as of 07/26/2017      Reactions   Hydrocodone Other (See Comments)   Oxycodone Hives      Medication List    STOP taking these medications   HYDROcodone-acetaminophen 5-325 MG tablet Commonly known as:  NORCO/VICODIN   magnesium hydroxide 400 MG/5ML suspension Commonly known as:  MILK OF MAGNESIA   oral electrolytes Tabs tablet   predniSONE 10 MG (21) Tbpk tablet Commonly known as:  STERAPRED UNI-PAK 21 TAB Replaced by:  predniSONE 50 MG tablet     TAKE these medications   albuterol 108 (90 Base) MCG/ACT inhaler Commonly known as:  PROVENTIL HFA;VENTOLIN HFA Inhale 2 puffs into the lungs every 6 (six) hours as needed for wheezing or shortness of breath.   ALPRAZolam 0.25 MG tablet Commonly known as:  XANAX Take 1 tablet (0.25 mg total) by mouth 3 (three) times daily as needed for anxiety.   alum & mag hydroxide-simeth 200-200-20 MG/5ML suspension Commonly known as:  MAALOX/MYLANTA Take 30 mLs by mouth every 4 (four) hours as needed for indigestion.   ANORO ELLIPTA 62.5-25 MCG/INH Aepb Generic drug:  umeclidinium-vilanterol Inhale 1 puff into the lungs daily.   aspirin EC 81 MG tablet Take 81 mg by mouth daily.   buPROPion 75 MG tablet Commonly known as:  WELLBUTRIN Take 75 mg by mouth daily.   diltiazem 120 MG 24 hr capsule Commonly known as:  DILACOR XR Take 120 mg by mouth daily.   docusate sodium 100 MG capsule Commonly known as:  COLACE Take 1 capsule (100 mg total) by mouth 2 (two) times daily.   donepezil 5 MG tablet Commonly known as:  ARICEPT Take 5 mg by mouth as needed.   doxycycline 100 MG tablet Commonly known as:  VIBRA-TABS Take 1 tablet (100 mg total) by mouth every 12  (twelve) hours for 5 days.   feeding supplement (ENSURE ENLIVE) Liqd Take 237 mLs by mouth 2 (two) times daily between meals.   ferrous sulfate 325 (65 FE) MG tablet Take 1 tablet (325 mg total) by mouth 2 (two) times daily with a meal.   furosemide 20 MG tablet Commonly known as:  LASIX Take 1 tablet (20 mg total) by mouth daily.   galantamine 8 MG tablet Commonly known as:  RAZADYNE Take 8 mg by mouth every morning.   levalbuterol 1.25 MG/3ML nebulizer solution Commonly known as:  XOPENEX Take 1.25 mg by nebulization every 6 (six) hours as needed for wheezing.   levothyroxine 75 MCG tablet Commonly known as:  SYNTHROID, LEVOTHROID Take 1 tablet (75 mcg total) by mouth daily before breakfast.   metoprolol tartrate 25 MG tablet Commonly known as:  LOPRESSOR Take 0.5 tablets (12.5 mg total) by mouth 2 (two) times daily.   nitroGLYCERIN 0.4 MG SL tablet Commonly known as:  NITROSTAT Place 0.4 mg under the tongue every 5 (five) minutes as needed for chest pain.   pantoprazole 40 MG tablet Commonly known as:  PROTONIX Take 1 tablet (40 mg total) by mouth 2 (two) times daily.   predniSONE 50 MG tablet Commonly known as:  DELTASONE Take 1 tablet (50 mg total) by mouth daily with breakfast for 5 days. Replaces:  predniSONE 10 MG (21) Tbpk tablet   sertraline 50 MG tablet Commonly  known as:  ZOLOFT Take 50 mg by mouth daily.   simvastatin 20 MG tablet Commonly known as:  ZOCOR Take 20 mg by mouth at bedtime.   telmisartan 40 MG tablet Commonly known as:  MICARDIS Take 40 mg by mouth daily.            Durable Medical Equipment  (From admission, onward)        Start     Ordered   07/23/17 1027  DME Oxygen  Once    Question Answer Comment  Mode or (Route) Nasal cannula   Liters per Minute 2   Frequency Continuous (stationary and portable oxygen unit needed)   Oxygen conserving device Yes   Oxygen delivery system Gas      07/23/17 1027      Vitals:    07/26/17 1621 07/26/17 1631  BP: (!) 96/56 (!) 147/55  Pulse: 66 68  Resp: 18 18  Temp: 98.2 F (36.8 C) 98.2 F (36.8 C)  SpO2: 99% 99%    Tele box removed and returned.Skin clean, dry and intact without evidence of skin break down, no evidence of skin tears noted. IV catheter discontinued intact. Site without signs and symptoms of complications. Dressing and pressure applied. Pt denies pain at this time. No complaints noted.  An After Visit Summary was printed and given to the patient. Patient escorted via stretcher, and D/C home via EMS.  Rolley Sims

## 2017-07-26 NOTE — Progress Notes (Signed)
[facility] [place] NAME: Holly Clark    MR#:  [id number]  DATE OF BIRTH:  06/28/1929  SUBJECTIVE:  Doing well this am  REVIEW OF SYSTEMS:    Review of Systems  Constitutional: Negative for fever, chills weight loss HENT: Negative for ear pain, nosebleeds, congestion, facial swelling, rhinorrhea, neck pain, neck stiffness and ear discharge.   Respiratory: Positive for cough with improved shortness of breath and no wheezing  Cardiovascular: Negative for chest pain, palpitations and leg swelling.  Gastrointestinal: Negative for heartburn, abdominal pain, vomiting, diarrhea or consitpation Genitourinary: Negative for dysuria, urgency, frequency, hematuria Musculoskeletal: Negative for back pain or joint pain Neurological: Negative for dizziness, seizures, syncope, focal weakness,  numbness and headaches.  Hematological: Does not bruise/bleed easily.  Psychiatric/Behavioral: Negative for hallucinations,  dysphoric mood  She has dementia  Tolerating Diet: yes      DRUG ALLERGIES:   Allergies  Allergen Reactions  . Hydrocodone Other (See Comments)  . Oxycodone Hives    VITALS:  Blood pressure (!) 142/70, pulse 70, temperature 98.1 F (36.7 C), temperature source Oral, resp. rate 18, height 5\' 3"  (1.6 m), weight 43.9 kg (96 lb 12.8 oz), SpO2 100 %.  PHYSICAL EXAMINATION:  Constitutional: Appears well-developed and well-nourished. No distress. HENT: Normocephalic. Marland Kitchen Oropharynx is clear and moist.  Eyes: Conjunctivae and EOM are normal. PERRLA, no scleral icterus.  Neck: Normal ROM. Neck supple. No JVD. No tracheal deviation. CVS: RRR, S1/S2 +, no murmurs, no gallops, no carotid bruit.  Pulmonary: Normal effort good air flow and mild bilateral wheezing on expiration  Abdominal: Soft. BS +,  no distension, tenderness, rebound or guarding.  Musculoskeletal: Normal range of motion. No edema and no tenderness.  Neuro: Alert. CN 2-12  grossly intact. No focal deficits. Skin: Skin is warm and dry. No rash noted. Psychiatric: Normal mood and affect.      LABORATORY PANEL:   CBC Recent Labs  Lab 07/23/17 0451  WBC 7.6  HGB 9.9*  HCT 30.1*  PLT 135*   ------------------------------------------------------------------------------------------------------------------  Chemistries  Recent Labs  Lab 07/22/17 2055  07/25/17 0535  NA 141   < > 140  K 3.6   < > 3.9  CL 106   < > 104  CO2 25   < > 27  GLUCOSE 112*   < > 126*  BUN 27*   < > 30*  CREATININE 1.30*   < > 1.26*  CALCIUM 8.8*   < > 8.8*  AST 25  --   --   ALT 37  --   --   ALKPHOS 106  --   --   BILITOT 0.7  --   --    < > = values in this interval not displayed.   ------------------------------------------------------------------------------------------------------------------  Cardiac Enzymes Recent Labs  Lab 07/22/17 2055 07/23/17 0633  TROPONINI 0.30* 0.22*   ------------------------------------------------------------------------------------------------------------------  RADIOLOGY:  No results found.   ASSESSMENT AND PLAN:    84 year old female with a history of CAD, dementia and COPD with 2 hospitalizations this month for COPD exacerbation who presents shortness of breath.  1. Acute hypoxic respiratory failure in the setting of acute exacerbation of COPD with acute bronchitis CT scan did not show evidence of pulmonary emboli or pneumonia She will need oxygen at discharge  2. Acute exacerbation COPD with acute bronchitis: she will continue with oral steroids and nebulizers. She will continue doxycycline for acute bronchitis  3.Acute onChronic diastolic heart failure  with preserved ejection fraction by echocardiogramwith pEF Continue daily Lasix CHF referral upon discharge  4. Dementia: Continue Aricept  5. Hypothyroidism: Continue Synthroid  6. Essential hypertension: Continue diltiazem and Avapro and  metoprolol  7. Depression: Continue continue Wellbutrin and Zoloft  8. Anemia chronic disease on ferrous sulfate  9. Hyperlipidemia: Continue Zocor  10. Tobacco dependence: Patient is encouraged to quit smoking. Counseling was provided for 4 minutes. 11. Chronic kidney disease stage II: Creatinine remains at baseline   Physical therapy consultation requested for discharge planning  Management plans discussed with the patient and family and she is in agreement.  CODE STATUS: FULL  TOTAL TIME TAKING CARE OF THIS PATIENT: 25 minutes.     POSSIBLE D/C today DEPENDING ON insurance Holly Clark M.D on 07/26/2017 at 11:14 AM  Between 7am to 6pm - Pager - [phone] After 6pm go to [email or web address] - password [email or web address] [facility]  Office  [phone]  CC: Primary care physician; Rusty Aus, MD  Note: This dictation was prepared with Dragon dictation along with smaller phrase technology. Any transcriptional errors that result from this process are unintentional.

## 2017-07-26 NOTE — Progress Notes (Signed)
Physical Therapy Treatment Patient Details Name: MARGUETTA WINDISH MRN: [id number] DOB: 06-19-1929 Today's Date: 07/26/2017    History of Present Illness Bryson Palen  is a 84 y.o. female with a known history of coronary artery disease, dementia and COPD.  She was recently hospitalized for acute COPD exacerbation, just 2 weeks ago.  She was treated with 3 days of azithromycin in the hospital. Patient came to emergency room this time, via EMS.  Patient's family noted her to be very short of breath call 911.  When paramedics arrived she was hypoxemic with oxygen saturation in low 80s.  She was given a nebulizer treatment without much improvement. The arrival to emergency room patient was still hypoxemic and very short of breath.  She was started on BiPAP and given steroids and more nebulizer treatment with good results.  Blood test done in the emergency room show within normal limits WBC.  Creatinine is noted to be slightly elevated at 1.3 which is the baseline for the patient.  Troponin is chronically elevated at 0.3.  CT Angio of the chest is negative for PE, but it shows moderate emphysema and central bronchial thickening. Pt currently under observation status for acute hypoxemic respiratory failure secondary to COD exaccerbation and acute bronchitis.     PT Comments    Pt requires minA+1 to get to standing with posterior LOB with repeated transfers. Transfers performed from both bed and commode. Pt is able to ambulate to bathroom and back to bed. She requires repeated cues to remain within confines of walker during ambulation. Decreased step length, poor safety awareness, and instability requiring minA+1 from therapist to prevent falls. SaO2 95% on room air following ambulation. 98% on room air at rest. Pt is able to complete all seated and supine exercises as instructed by therapist. She wil need SNF placement to facilitate safe transition back home. Pt will benefit from PT services to address deficits  in strength, balance, and mobility in order to return to full function at home.    Follow Up Recommendations  SNF     Equipment Recommendations  None recommended by PT    Recommendations for Other Services       Precautions / Restrictions Precautions Precautions: Fall Restrictions Weight Bearing Restrictions: No    Mobility  Bed Mobility Overal bed mobility: Needs Assistance Bed Mobility: Supine to Sit     Supine to sit: Min assist     General bed mobility comments: Pt uses bed railing and assist from therapist to come to sitting. Increased time required. HOB elevated.  Transfers Overall transfer level: Needs assistance Equipment used: Rolling walker (2 wheeled) Transfers: Sit to/from Stand Sit to Stand: Min assist         General transfer comment: Pt requires minA+1 to get to standing with posterior LOB with repeated transfers. Transfers performed from both bed and commode.  Ambulation/Gait Ambulation/Gait assistance: Min assist Ambulation Distance (Feet): 30 Feet Assistive device: Rolling walker (2 wheeled)   Gait velocity: Decreased Gait velocity interpretation: <1.8 ft/sec, indicative of risk for recurrent falls General Gait Details: Pt is able to ambulate to bathroom and back to chair. She requires repeated cues to remain within confines of walker. Decreased step length, poor safety awareness, and instability requiring minA+1 from therapist to prevent falls. SaO2 95% on room air following ambulation. 98% on room air at rest   Stairs            Wheelchair Mobility    Modified Rankin (Stroke Patients Only)  Balance Overall balance assessment: Needs assistance Sitting-balance support: No upper extremity supported Sitting balance-Leahy Scale: Fair     Standing balance support: Bilateral upper extremity supported Standing balance-Leahy Scale: Poor Standing balance comment: Requires mina+1 to stabilize in standing                             Cognition Arousal/Alertness: Awake/alert Behavior During Therapy: WFL for tasks assessed/performed Overall Cognitive Status: History of cognitive impairments - at baseline                                 General Comments: History of cognitive impairments but AOx4 at time of evaluation      Exercises General Exercises - Lower Extremity Long Arc Quad: Both;10 reps Heel Slides: Both;10 reps Hip ABduction/ADduction: Both;10 reps Straight Leg Raises: Both;10 reps Hip Flexion/Marching: Both;10 reps Heel Raises: Both;10 reps Other Exercises Other Exercises: Sit to stand without UE support x 5 from bed with minA+1 assist from therapist    General Comments        Pertinent Vitals/Pain Pain Assessment: No/denies pain    Home Living                      Prior Function            PT Goals (current goals can now be found in the care plan section) Acute Rehab PT Goals Patient Stated Goal: Return to prior function  PT Goal Formulation: With patient/family Time For Goal Achievement: 08/07/17 Potential to Achieve Goals: Fair Progress towards PT goals: Progressing toward goals    Frequency    Min 2X/week      PT Plan Current plan remains appropriate    Co-evaluation              AM-PAC PT "6 Clicks" Daily Activity  Outcome Measure  Difficulty turning over in bed (including adjusting bedclothes, sheets and blankets)?: A Little Difficulty moving from lying on back to sitting on the side of the bed? : Unable Difficulty sitting down on and standing up from a chair with arms (e.g., wheelchair, bedside commode, etc,.)?: Unable Help needed moving to and from a bed to chair (including a wheelchair)?: A Lot Help needed walking in hospital room?: A Lot Help needed climbing 3-5 steps with a railing? : Total 6 Click Score: 10    End of Session Equipment Utilized During Treatment: Gait belt Activity Tolerance: Patient tolerated treatment  well Patient left: in bed;with bed alarm set;with family/visitor present;with call bell/phone within reach Nurse Communication: Mobility status(sore on L buttock) PT Visit Diagnosis: Unsteadiness on feet (R26.81);Repeated falls (R29.6);Muscle weakness (generalized) (M62.81);History of falling (Z91.81);Difficulty in walking, not elsewhere classified (R26.2)     Time: 8502-7741 PT Time Calculation (min) (ACUTE ONLY): 24 min  Charges:  $Gait Training: 8-22 mins $Therapeutic Exercise: 8-22 mins                    G Codes:       Lyndel Safe Pau Banh PT, DPT     Whittaker Lenis 07/26/2017, 11:55 AM

## 2017-07-26 NOTE — Progress Notes (Signed)
Pt up for d/c per MD order. Report called to Benjie Karvonen, Therapist, sports at [facility]. Awaiting on transportation.

## 2017-07-26 NOTE — Clinical Social Work Note (Signed)
CSW received phone call from [facility] and they have received insurance approval for patient to go to SNF for rehab.  Patient to be d/c'ed today to [facility].  Patient and family agreeable to plans will transport via ems RN to call report to [phone].  CSW updated patient's daughter Alycia Rossetti [phone].   Evette Cristal, MSW, [place]

## 2017-07-27 ENCOUNTER — Other Ambulatory Visit: Payer: Self-pay

## 2017-07-27 DIAGNOSIS — I471 Supraventricular tachycardia: Secondary | ICD-10-CM | POA: Diagnosis not present

## 2017-07-27 DIAGNOSIS — G9341 Metabolic encephalopathy: Secondary | ICD-10-CM | POA: Diagnosis not present

## 2017-07-27 DIAGNOSIS — J440 Chronic obstructive pulmonary disease with acute lower respiratory infection: Secondary | ICD-10-CM | POA: Diagnosis not present

## 2017-07-27 DIAGNOSIS — I480 Paroxysmal atrial fibrillation: Secondary | ICD-10-CM | POA: Diagnosis not present

## 2017-07-27 DIAGNOSIS — I25810 Atherosclerosis of coronary artery bypass graft(s) without angina pectoris: Secondary | ICD-10-CM | POA: Diagnosis not present

## 2017-07-27 DIAGNOSIS — I4892 Unspecified atrial flutter: Secondary | ICD-10-CM | POA: Diagnosis not present

## 2017-07-27 DIAGNOSIS — M549 Dorsalgia, unspecified: Secondary | ICD-10-CM | POA: Diagnosis not present

## 2017-07-27 DIAGNOSIS — I10 Essential (primary) hypertension: Secondary | ICD-10-CM | POA: Diagnosis not present

## 2017-07-27 MED ORDER — ALPRAZOLAM 0.25 MG PO TABS: 0.2500 mg | ORAL_TABLET | Freq: Three times a day (TID) | ORAL | 0 refills | Status: AC | PRN

## 2017-07-28 ENCOUNTER — Other Ambulatory Visit
Admission: RE | Admit: 2017-07-28 | Discharge: 2017-07-28 | Disposition: A | Discharge status: Home / Self Care | Payer: Medicare HMO | Source: Other Acute Inpatient Hospital | Attending: Internal Medicine | Admitting: Internal Medicine

## 2017-07-28 DIAGNOSIS — J440 Chronic obstructive pulmonary disease with acute lower respiratory infection: Secondary | ICD-10-CM | POA: Insufficient documentation

## 2017-07-28 DIAGNOSIS — I959 Hypotension, unspecified: Secondary | ICD-10-CM | POA: Diagnosis not present

## 2017-07-28 LAB — CBC WITH DIFFERENTIAL/PLATELET
BASOS ABS: 0 10*3/uL (ref 0–0.1)
Basophils Relative: 0 %
Eosinophils Absolute: 0 10*3/uL (ref 0–0.7)
Eosinophils Relative: 0 %
HEMATOCRIT: 30.6 % — AB (ref 35.0–47.0)
Hemoglobin: 10.2 g/dL — ABNORMAL LOW (ref 12.0–16.0)
LYMPHS PCT: 7 %
Lymphs Abs: 0.6 10*3/uL — ABNORMAL LOW (ref 1.0–3.6)
MCH: 30.1 pg (ref 26.0–34.0)
MCHC: 33.3 g/dL (ref 32.0–36.0)
MCV: 90.3 fL (ref 80.0–100.0)
MONOS PCT: 12 %
Monocytes Absolute: 1 10*3/uL — ABNORMAL HIGH (ref 0.2–0.9)
NEUTROS PCT: 81 %
Neutro Abs: 7 10*3/uL — ABNORMAL HIGH (ref 1.4–6.5)
Platelets: 222 10*3/uL (ref 150–440)
RBC: 3.39 MIL/uL — AB (ref 3.80–5.20)
RDW: 16.7 % — AB (ref 11.5–14.5)
WBC: 8.6 10*3/uL (ref 3.6–11.0)

## 2017-07-28 LAB — COMPREHENSIVE METABOLIC PANEL
ALBUMIN: 3.2 g/dL — AB (ref 3.5–5.0)
ALT: 25 U/L (ref 14–54)
AST: 22 U/L (ref 15–41)
Alkaline Phosphatase: 67 U/L (ref 38–126)
Anion gap: 10 (ref 5–15)
BILIRUBIN TOTAL: 0.7 mg/dL (ref 0.3–1.2)
BUN: 48 mg/dL — AB (ref 6–20)
CHLORIDE: 98 mmol/L — AB (ref 101–111)
CO2: 32 mmol/L (ref 22–32)
Calcium: 9.3 mg/dL (ref 8.9–10.3)
Creatinine, Ser: 1.23 mg/dL — ABNORMAL HIGH (ref 0.44–1.00)
GFR calc Af Amer: 45 mL/min — ABNORMAL LOW (ref >60)
GFR calc non Af Amer: 39 mL/min — ABNORMAL LOW (ref >60)
GLUCOSE: 99 mg/dL (ref 65–99)
POTASSIUM: 4.2 mmol/L (ref 3.5–5.1)
Sodium: 140 mmol/L (ref 135–145)
TOTAL PROTEIN: 5.4 g/dL — AB (ref 6.5–8.1)

## 2017-07-31 NOTE — Progress Notes (Signed)
Patient ID: Holly Clark, female    DOB: 1930/02/17, 84 y.o.   MRN: [id number]  HPI  Holly Clark is a 84 y/o female with a history of COPD, CAD, dementia, paroxysmal atrial fibrillation, PVD (iliac stents), HTN, hyperlipidemia, depression, hypothyroidism, recent tobacco use and chronic heart failure.   Echo report from 07/07/17 reviewed and showed an EF of 55-65% along with mild MR and moderate TR.   Admitted 07/22/17 due to exacerbation of COPD, HF and bronchitis. CT scan was negative. Needed steroids, antibiotics and diuretics. Discharged after 4 days. Admitted 07/13/17 due to tachycardia thought to be due to anxiety. Cardiology consult was obtained. Discharged the next day. Admitted 07/07/17 due to COPD exacerbation. Initially needed bipap and then transitioned to oxygen. Needed steroids, nebulizers and antibiotics. Elevated troponin thought to be due to demand ischemia and/or anemia. Was given 1 unit of PRBC's due to anemia and GI was consulted. Discharged after 4 days.  She presents today for her initial visit with a chief complaint of moderate shortness of breath with minimal exertion. Family members say this has been chronic in nature although they feel like she might be getting stronger. She has associated shortness of breath, cough and easy bruising along with this. Denies any chest pain, edema, abdominal distention or dizziness. Currently not being weighed daily at [facility]. Patient has dementia so history was obtained from her son and daughter.  Past Medical History:  Diagnosis Date  . COPD (chronic obstructive pulmonary disease) ([place])   . Coronary artery disease   . Dementia   . Depression   . Diastolic heart failure ([place])   . Hypothyroidism    Past Surgical History:  Procedure Laterality Date  . ABDOMINAL HYSTERECTOMY    . CARDIAC SURGERY    . CARDIOVERSION    . CATARACT EXTRACTION Bilateral 2012  . CORONARY ARTERY BYPASS GRAFT    . ESOPHAGOGASTRODUODENOSCOPY N/A 07/09/2017    Procedure: ESOPHAGOGASTRODUODENOSCOPY (EGD);  Surgeon: Lin Landsman, MD;  Location: [facility] ENDOSCOPY;  Service: Gastroenterology;  Laterality: N/A;  . [place]   patient unsure of which side  . HIP PINNING,CANNULATED Right 05/19/2015   Procedure: CANNULATED HIP PINNING;  Surgeon: Hessie Knows, MD;  Location: [facility] ORS;  Service: Orthopedics;  Laterality: Right;   Family History  Problem Relation Age of Onset  . Stroke Mother    Social History   Tobacco Use  . Smoking status: Former Smoker    Packs/day: 0.25    Types: Cigarettes    Last attempt to quit: 07/01/2017    Years since quitting: 0.0  . Smokeless tobacco: Never Used  Substance Use Topics  . Alcohol use: Yes    Comment: rare, glass of wine occasionally   Allergies  Allergen Reactions  . Hydrocodone Other (See Comments)  . Oxycodone Hives   Prior to Admission medications   Medication Sig Start Date End Date Taking? Authorizing Provider  albuterol (PROVENTIL HFA;VENTOLIN HFA) 108 (90 BASE) MCG/ACT inhaler Inhale 2 puffs into the lungs every 6 (six) hours as needed for wheezing or shortness of breath. 05/22/15  Yes Epifanio Lesches, MD  ALPRAZolam Duanne Moron) 0.25 MG tablet Take 1 tablet (0.25 mg total) by mouth 3 (three) times daily as needed for up to 14 days for anxiety. 07/27/17 08/10/17 Yes Toni Arthurs, NP  alum & mag hydroxide-simeth (MAALOX/MYLANTA) 200-200-20 MG/5ML suspension Take 30 mLs by mouth every 4 (four) hours as needed for indigestion. 05/22/15  Yes Epifanio Lesches, MD  aspirin EC  81 MG tablet Take 81 mg by mouth daily.   Yes [provider]  buPROPion (WELLBUTRIN) 75 MG tablet Take 75 mg by mouth daily.   Yes [provider]  diltiazem (DILACOR XR) 120 MG 24 hr capsule Take 120 mg by mouth daily.   Yes [provider]  docusate sodium (COLACE) 100 MG capsule Take 1 capsule (100 mg total) by mouth 2 (two) times daily. 05/22/15  Yes Epifanio Lesches, MD   donepezil (ARICEPT) 5 MG tablet Take 5 mg by mouth as needed.    Yes [provider]  feeding supplement, ENSURE ENLIVE, (ENSURE ENLIVE) LIQD Take 237 mLs by mouth 2 (two) times daily between meals. 07/25/17  Yes Bettey Costa, MD  ferrous sulfate 325 (65 FE) MG tablet Take 1 tablet (325 mg total) by mouth 2 (two) times daily with a meal. 07/10/17  Yes Vaughan Basta, MD  furosemide (LASIX) 20 MG tablet Take 1 tablet (20 mg total) by mouth daily. 07/25/17 07/25/18 Yes Mody, Ulice Bold, MD  levalbuterol (XOPENEX) 1.25 MG/3ML nebulizer solution Take 1.25 mg by nebulization every 6 (six) hours as needed for wheezing.   Yes [provider]  levothyroxine (SYNTHROID, LEVOTHROID) 75 MCG tablet Take 1 tablet (75 mcg total) by mouth daily before breakfast. 07/14/17  Yes Vaughan Basta, MD  metoprolol tartrate (LOPRESSOR) 25 MG tablet Take 0.5 tablets (12.5 mg total) by mouth 2 (two) times daily. 07/10/17  Yes Vaughan Basta, MD  nitroGLYCERIN (NITROSTAT) 0.4 MG SL tablet Place 0.4 mg under the tongue every 5 (five) minutes as needed for chest pain.   Yes [provider]  pantoprazole (PROTONIX) 40 MG tablet Take 1 tablet (40 mg total) by mouth 2 (two) times daily. 07/10/17  Yes Vaughan Basta, MD  sertraline (ZOLOFT) 50 MG tablet Take 50 mg by mouth daily.   Yes [provider]  simvastatin (ZOCOR) 20 MG tablet Take 20 mg by mouth at bedtime.   Yes [provider]  telmisartan (MICARDIS) 40 MG tablet Take 40 mg by mouth daily.   Yes [provider]  Umeclidinium-Vilanterol (ANORO ELLIPTA) 62.5-25 MCG/INH AEPB Inhale 1 puff into the lungs daily.   Yes [provider]    Review of Systems  Constitutional: Positive for appetite change (decreased) and fatigue.  HENT: Negative for congestion, postnasal drip and sore throat.   Eyes: Negative.   Respiratory: Positive for cough (croupy cough) and shortness of breath.   Cardiovascular:  Negative for chest pain, palpitations and leg swelling.  Gastrointestinal: Negative for abdominal distention and abdominal pain.  Endocrine: Negative.   Genitourinary: Negative.   Musculoskeletal: Negative for back pain and neck pain.  Skin: Negative.   Allergic/Immunologic: Negative.   Neurological: Negative for dizziness and light-headedness.  Hematological: Negative for adenopathy. Bruises/bleeds easily.  Psychiatric/Behavioral: Negative for sleep disturbance.    Vitals:   08/01/17 1206  BP: 102/67  Pulse: 74  Resp: 18  SpO2: 99%  Weight: 98 lb (44.5 kg)  Height: 5' (1.524 m)   Wt Readings from Last 3 Encounters:  08/01/17 98 lb (44.5 kg)  07/26/17 96 lb 12.8 oz (43.9 kg)  07/13/17 100 lb 11.2 oz (45.7 kg)   Lab Results  Component Value Date   CREATININE 1.23 (H) 07/28/2017   CREATININE 1.26 (H) 07/25/2017   CREATININE 1.20 (H) 07/24/2017    Physical Exam  Constitutional: She appears well-developed.  thin  HENT:  Head: Normocephalic and atraumatic.  Neck: Normal range of motion. Neck supple. No JVD  present.  Cardiovascular: Normal rate. An irregular rhythm present.  Pulmonary/Chest: Effort normal. She has no wheezes. She has rhonchi in the right upper field, the right lower field, the left upper field and the left lower field. She has no rales.  Abdominal: Soft. She exhibits no distension. There is no tenderness.  Musculoskeletal: She exhibits tenderness (in calves). She exhibits no edema.  Neurological: She is alert. She displays no tremor.  Skin: Skin is warm and dry. Ecchymosis (both lower arms/lower legs) noted.  Psychiatric: Her behavior is normal. Her mood appears not anxious. Cognition and memory are impaired. She does not exhibit a depressed mood.  Nursing note and vitals reviewed.  Assessment & Plan:  1: Chronic heart failure with preserved ejection fraction- - NYHA class III - euvolemic today - not being weighed daily so an order was written for her  to be weighed daily and to call for an overnight weight gain of >2 pounds or a weekly weight gain of >5 pounds - currently on a low sodium diet at [facility] and she is not using a salt shaker. Reviewed the importance of closely following a 2000mg  sodium diet when she goes home - saw cardiology (Fath) 07/18/17 & returns to him later today - patient reports receiving her flu vaccine for this season - BNP on 07/22/17 was 1667.0  2: HTN- - BP looks good today - currently seeing the PCP at [facility]; sees Dr. Sabra Heck when at home - BMP from 07/28/17 reviewed and showed sodium 140, potassium 4.2 and GFR 39  3: Atrial fibrillation- - been cardioverted in the past - no anticoagulation due to increased fall risk - lots of bruising on arms/legs  4: Dementia- - is confused and most of the history is obtained from the daughter/son  5: COPD- - does have rhonchi present throughout - recently finished antibiotics and steroids  Facility medication list was reviewed.  Return here in 1 month or sooner for any questions/problems before then.

## 2017-08-01 ENCOUNTER — Ambulatory Visit: Payer: Medicare HMO | Attending: Family | Admitting: Family

## 2017-08-01 ENCOUNTER — Encounter: Payer: Self-pay | Admitting: Family

## 2017-08-01 DIAGNOSIS — E785 Hyperlipidemia, unspecified: Secondary | ICD-10-CM | POA: Insufficient documentation

## 2017-08-01 DIAGNOSIS — Z951 Presence of aortocoronary bypass graft: Secondary | ICD-10-CM | POA: Insufficient documentation

## 2017-08-01 DIAGNOSIS — F329 Major depressive disorder, single episode, unspecified: Secondary | ICD-10-CM | POA: Diagnosis not present

## 2017-08-01 DIAGNOSIS — I480 Paroxysmal atrial fibrillation: Secondary | ICD-10-CM | POA: Diagnosis not present

## 2017-08-01 DIAGNOSIS — Z87891 Personal history of nicotine dependence: Secondary | ICD-10-CM | POA: Insufficient documentation

## 2017-08-01 DIAGNOSIS — I5032 Chronic diastolic (congestive) heart failure: Secondary | ICD-10-CM | POA: Diagnosis not present

## 2017-08-01 DIAGNOSIS — I10 Essential (primary) hypertension: Secondary | ICD-10-CM

## 2017-08-01 DIAGNOSIS — Z885 Allergy status to narcotic agent status: Secondary | ICD-10-CM | POA: Insufficient documentation

## 2017-08-01 DIAGNOSIS — J449 Chronic obstructive pulmonary disease, unspecified: Secondary | ICD-10-CM | POA: Diagnosis not present

## 2017-08-01 DIAGNOSIS — Z823 Family history of stroke: Secondary | ICD-10-CM | POA: Insufficient documentation

## 2017-08-01 DIAGNOSIS — I4891 Unspecified atrial fibrillation: Secondary | ICD-10-CM | POA: Diagnosis not present

## 2017-08-01 DIAGNOSIS — I2510 Atherosclerotic heart disease of native coronary artery without angina pectoris: Secondary | ICD-10-CM | POA: Insufficient documentation

## 2017-08-01 DIAGNOSIS — Z7982 Long term (current) use of aspirin: Secondary | ICD-10-CM | POA: Insufficient documentation

## 2017-08-01 DIAGNOSIS — I25810 Atherosclerosis of coronary artery bypass graft(s) without angina pectoris: Secondary | ICD-10-CM | POA: Diagnosis not present

## 2017-08-01 DIAGNOSIS — E782 Mixed hyperlipidemia: Secondary | ICD-10-CM | POA: Diagnosis not present

## 2017-08-01 DIAGNOSIS — I110 Hypertensive heart disease with heart failure: Secondary | ICD-10-CM | POA: Insufficient documentation

## 2017-08-01 DIAGNOSIS — Z79899 Other long term (current) drug therapy: Secondary | ICD-10-CM | POA: Insufficient documentation

## 2017-08-01 DIAGNOSIS — Z9889 Other specified postprocedural states: Secondary | ICD-10-CM | POA: Insufficient documentation

## 2017-08-01 DIAGNOSIS — Z90710 Acquired absence of both cervix and uterus: Secondary | ICD-10-CM | POA: Insufficient documentation

## 2017-08-01 DIAGNOSIS — F0390 Unspecified dementia without behavioral disturbance: Secondary | ICD-10-CM | POA: Diagnosis not present

## 2017-08-01 DIAGNOSIS — E039 Hypothyroidism, unspecified: Secondary | ICD-10-CM | POA: Insufficient documentation

## 2017-08-01 DIAGNOSIS — I739 Peripheral vascular disease, unspecified: Secondary | ICD-10-CM | POA: Insufficient documentation

## 2017-08-01 NOTE — Patient Instructions (Signed)
Continue weighing daily and call for an overnight weight gain of > 2 pounds or a weekly weight gain of >5 pounds. 

## 2017-08-04 ENCOUNTER — Non-Acute Institutional Stay (SKILLED_NURSING_FACILITY): Payer: Medicare HMO | Admitting: Gerontology

## 2017-08-04 ENCOUNTER — Encounter
Admission: RE | Admit: 2017-08-04 | Discharge: 2017-08-04 | Disposition: A | Discharge status: Home / Self Care | Payer: Medicare HMO | Source: Ambulatory Visit | Attending: Internal Medicine | Admitting: Internal Medicine

## 2017-08-04 ENCOUNTER — Other Ambulatory Visit: Payer: Self-pay

## 2017-08-04 ENCOUNTER — Encounter: Payer: Self-pay | Admitting: Gerontology

## 2017-08-04 DIAGNOSIS — J449 Chronic obstructive pulmonary disease, unspecified: Secondary | ICD-10-CM

## 2017-08-04 DIAGNOSIS — F0390 Unspecified dementia without behavioral disturbance: Secondary | ICD-10-CM

## 2017-08-04 NOTE — Progress Notes (Signed)
Location:   [facility] Room Number: [place] of Service:  SNF [phone]) Provider:  Toni Arthurs, NP-C  Rusty Aus, MD  Patient Care Team: Rusty Aus, MD as PCP - General (Internal Medicine)  Extended Emergency Contact Information Primary Emergency Contact: [facility] Phone: [phone] Relation: Daughter Secondary Emergency Contact: Drema Balzarine Address: [address]          [place], [place] [address] Home Phone: [phone] Mobile Phone: [phone] Relation: Daughter  Code Status:  FULL Goals of care: Advanced Directive information Advanced Directives 08/04/2017  Does Patient Have a Medical Advance Directive? Yes  Type of Paramedic of [place];Living will  Does patient want to make changes to medical advance directive? No - Patient declined  Copy of [place] in Chart? Yes  Would patient like information on creating a medical advance directive? -     Chief Complaint  Patient presents with  . Medical Management of Chronic Issues    Routine Visit    HPI:  Pt is a 84 y.o. female seen today for medical management of chronic diseases. PT was admitted to the facility for rehab following hospitalization for COPD exacerbation with respiratory failure. Pt has generalized weakness and deconditioning. Pt also has dementia. Prior to admission, she lived with her son. She has had decreased ability to perform ADLs independently, but refused care from hired caregivers. She is confused/ disoriented. She has had worsening failing health with multiple trips to the ED within the past few weeks. Pt is unable to fully participate in therapies. Pt denies pain. Poor appetite. Intermittently refuses meds and care. Wants staff to call her a cab so she can go home. Re-oriented frequently by staff and family. Otherwise, VSS, no other complaints.   Please note pt with limited verbal/cognitive ability. Unable to  obtain complete ROS. Some ROS info obtained from staff and documentation.    Past Medical History:  Diagnosis Date  . Anxiety    unspecified  . Atrial fibrillation ([place])   . COPD (chronic obstructive pulmonary disease) ([place]) 10/05/2013  . Coronary artery disease 10/05/2013  . Dementia   . Depression   . Diastolic heart failure ([place])   . Hyperlipidemia, unspecified   . Hypertension 10/05/2013  . Hypothyroidism   . Hypothyroidism, acquired    unspecified  . PVD (peripheral vascular disease) ([place]) 10/05/2013   iliac stents   Past Surgical History:  Procedure Laterality Date  . ABDOMINAL HYSTERECTOMY    . CARDIAC SURGERY    . CARDIOVERSION    . CATARACT EXTRACTION Bilateral 2012  . CORONARY ARTERY BYPASS GRAFT  1985   x3  . ESOPHAGOGASTRODUODENOSCOPY N/A 07/09/2017   Procedure: ESOPHAGOGASTRODUODENOSCOPY (EGD);  Surgeon: Lin Landsman, MD;  Location: [facility] END[facility]COPY;  Service: Gastroenterology;  Laterality: N/A;  . [place]   patient unsure of which side  . HIP PINNING,CANNULATED Right 05/19/2015   Procedure: CANNULATED HIP PINNING;  Surgeon: Hessie Knows, MD;  Location: [facility] ORS;  Service: Orthopedics;  Laterality: Right;    Allergies  Allergen Reactions  . Hydrocodone Other (See Comments)  . Oxycodone Hives    Allergies as of 08/04/2017      Reactions   Hydrocodone Other (See Comments)   Oxycodone Hives      Medication List        Accurate as of 08/04/17  1:59 PM. Always use your most recent med list.  albuterol 108 (90 Base) MCG/ACT inhaler Commonly known as:  PROVENTIL HFA;VENTOLIN HFA Inhale 2 puffs into the lungs every 6 (six) hours as needed for wheezing or shortness of breath.   ALPRAZolam 0.25 MG tablet Commonly known as:  XANAX Take 1 tablet (0.25 mg total) by mouth 3 (three) times daily as needed for up to 14 days for anxiety.   alum & mag hydroxide-simeth 200-200-20 MG/5ML suspension Commonly known as:   MAALOX/MYLANTA Take 30 mLs by mouth every 4 (four) hours as needed for indigestion.   ANORO ELLIPTA 62.5-25 MCG/INH Aepb Generic drug:  umeclidinium-vilanterol Inhale 1 puff into the lungs daily.   aspirin EC 81 MG tablet Take 81 mg by mouth daily.   bisacodyl 10 MG suppository Commonly known as:  DULCOLAX Place 10 mg rectally daily as needed.   buPROPion 75 MG tablet Commonly known as:  WELLBUTRIN Take 75 mg by mouth daily.   DERMACLOUD Crea Apply liberal amount topically to area of skin irritation daily as needed. OK to leave at bedside.   diltiazem 120 MG 24 hr capsule Commonly known as:  DILACOR XR Take 120 mg by mouth daily.   docusate sodium 100 MG capsule Commonly known as:  COLACE Take 1 capsule (100 mg total) by mouth 2 (two) times daily.   donepezil 5 MG tablet Commonly known as:  ARICEPT Take 5 mg by mouth as needed.   feeding supplement (ENSURE ENLIVE) Liqd Take 237 mLs by mouth 2 (two) times daily between meals.   ferrous sulfate 325 (65 FE) MG tablet Take 1 tablet (325 mg total) by mouth 2 (two) times daily with a meal.   furosemide 20 MG tablet Commonly known as:  LASIX Take 1 tablet (20 mg total) by mouth daily.   levalbuterol 1.25 MG/3ML nebulizer solution Commonly known as:  XOPENEX Take 1.25 mg by nebulization 2 (two) times daily.   levalbuterol 1.25 MG/3ML nebulizer solution Commonly known as:  XOPENEX Take 1.25 mg by nebulization every 6 (six) hours as needed for wheezing.   levothyroxine 75 MCG tablet Commonly known as:  SYNTHROID, LEVOTHROID Take 1 tablet (75 mcg total) by mouth daily before breakfast.   metoprolol tartrate 25 MG tablet Commonly known as:  LOPRESSOR Take 0.5 tablets (12.5 mg total) by mouth 2 (two) times daily.   nitroGLYCERIN 0.4 MG SL tablet Commonly known as:  NITROSTAT Place 0.4 mg under the tongue every 5 (five) minutes as needed for chest pain.   pantoprazole 40 MG tablet Commonly known as:  PROTONIX Take 1  tablet (40 mg total) by mouth 2 (two) times daily.   sertraline 50 MG tablet Commonly known as:  ZOLOFT Take 50 mg by mouth daily.   telmisartan 40 MG tablet Commonly known as:  MICARDIS Take 40 mg by mouth daily.       Review of Systems  Unable to perform ROS: Dementia  Constitutional: Negative for activity change, appetite change, chills, diaphoresis and fever.  HENT: Negative for congestion, mouth sores, nosebleeds, postnasal drip, sneezing, sore throat, trouble swallowing and voice change.   Respiratory: Negative for apnea, cough, choking, chest tightness, shortness of breath and wheezing.   Cardiovascular: Negative for chest pain, palpitations and leg swelling.  Gastrointestinal: Negative for abdominal distention, abdominal pain, constipation, diarrhea and nausea.  Genitourinary: Negative for difficulty urinating, dysuria, frequency and urgency.  Musculoskeletal: Positive for arthralgias (typical arthritis). Negative for back pain, gait problem and myalgias.  Skin: Negative for color change, pallor, rash and wound.  Neurological:  Positive for weakness. Negative for dizziness, tremors, syncope, speech difficulty, numbness and headaches.  Psychiatric/Behavioral: Positive for confusion and dysphoric mood. Negative for agitation and behavioral problems.  All other systems reviewed and are negative.   Immunization History  Administered Date(s) Administered  . Influenza Split 03/31/2014  . Influenza,inj,Quad PF,6+ Mos 05/20/2015  . Pneumococcal Polysaccharide-23 03/31/2014   Pertinent  Health Maintenance Due  Topic Date Due  . DEXA SCAN  05/25/1997  . PNA vac Low Risk Adult (1 of 2 - PCV13) 05/25/1997  . INFLUENZA VACCINE  01/04/2017   Fall Risk  08/01/2017  Falls in the past year? No   Functional Status Survey:    Vitals:   08/04/17 1323  BP: 128/64  Pulse: 65  Resp: 18  Temp: 97.6 F (36.4 C)  TempSrc: Oral  SpO2: 100%  Weight: 95 lb 9.6 oz (43.4 kg)  Height:  5' (1.524 m)   Body mass index is 18.67 kg/m. Physical Exam  Constitutional: Vital signs are normal. She appears well-developed and well-nourished. She is active and cooperative. She does not appear ill. No distress. Nasal cannula in place.  HENT:  Head: Normocephalic and atraumatic.  Mouth/Throat: Uvula is midline, oropharynx is clear and moist and mucous membranes are normal. Mucous membranes are not pale, not dry and not cyanotic.  Eyes: Pupils are equal, round, and reactive to light. Conjunctivae, EOM and lids are normal.  Neck: Trachea normal, normal range of motion and full passive range of motion without pain. Neck supple. No JVD present. No tracheal deviation, no edema and no erythema present. No thyromegaly present.  Cardiovascular: Normal rate, regular rhythm, normal heart sounds, intact distal pulses and normal pulses. Exam reveals no gallop, no distant heart sounds and no friction rub.  No murmur heard. Pulses:      Dorsalis pedis pulses are 2+ on the right side, and 2+ on the left side.  No edema  Pulmonary/Chest: Effort normal and breath sounds normal. No accessory muscle usage. No respiratory distress. She has no decreased breath sounds. She has no wheezes. She has no rhonchi. She has no rales. She exhibits no tenderness.  Abdominal: Soft. Normal appearance and bowel sounds are normal. She exhibits no distension and no ascites. There is no tenderness.  Musculoskeletal: Normal range of motion. She exhibits no edema or tenderness.  Expected osteoarthritis, stiffness; Bilateral Calves soft, supple. Negative Homan's Sign. B- pedal pulses equal; generalized weakness, deconditioning  Neurological: She is alert. She has normal strength. Coordination abnormal.  Skin: Skin is warm, dry and intact. She is not diaphoretic. No cyanosis. No pallor. Nails show no clubbing.  Psychiatric: Her speech is normal and behavior is normal. Thought content normal. Her mood appears anxious. Her affect is  labile. Cognition and memory are impaired. She expresses impulsivity and inappropriate judgment. She exhibits a depressed mood. She exhibits abnormal recent memory and abnormal remote memory.  Nursing note and vitals reviewed.   Labs reviewed: Recent Labs    07/13/17 1550  07/24/17 0527 07/25/17 0535 07/28/17 0435  NA  --    < > 141 140 140  K  --    < > 3.8 3.9 4.2  CL  --    < > 106 104 98*  CO2  --    < > 25 27 32  GLUCOSE  --    < > 138* 126* 99  BUN  --    < > 26* 30* 48*  CREATININE  --    < >  1.20* 1.26* 1.23*  CALCIUM  --    < > 8.8* 8.8* 9.3  MG 2.2  --   --   --   --    < > = values in this interval not displayed.   Recent Labs    07/22/17 2055 07/28/17 0435  AST 25 22  ALT 37 25  ALKPHOS 106 67  BILITOT 0.7 0.7  PROT 6.5 5.4*  ALBUMIN 3.7 3.2*   Recent Labs    07/13/17 1505  07/22/17 2055 07/23/17 0451 07/28/17 0435  WBC 15.5*   < > 9.4 7.6 8.6  NEUTROABS 13.6*  --  6.2  --  7.0*  HGB 12.4   < > 11.9* 9.9* 10.2*  HCT 38.4   < > 36.1 30.1* 30.6*  MCV 90.6   < > 91.4 92.5 90.3  PLT 380   < > 195 135* 222   < > = values in this interval not displayed.   Lab Results  Component Value Date   TSH 0.057 (L) 07/07/2017   No results found for: HGBA1C No results found for: CHOL, HDL, LDLCALC, LDLDIRECT, TRIG, CHOLHDL  Significant Diagnostic Results in last 30 days:  Dg Chest 1 View  Result Date: 07/13/2017 CLINICAL DATA:  Tachycardia and back pain today EXAM: CHEST 1 VIEW COMPARISON:  July 10, 2017 FINDINGS: The heart size and mediastinal contours are stable. Patient status post prior CABG and median sternotomy. Both lungs are clear. The lungs are hyperinflated. There is probably a hiatal hernia. The visualized skeletal structures are stable. IMPRESSION: No active cardiopulmonary disease. Electronically Signed   By: Abelardo Diesel M.D.   On: 07/13/2017 16:08   Dg Chest 1 View  Result Date: 07/10/2017 CLINICAL DATA:  Shortness of breath EXAM: CHEST 1 VIEW  COMPARISON:  07/07/2017, 05/19/2015 FINDINGS: Post sternotomy changes. Hyperinflation with emphysematous changes. Streaky atelectasis at the left lung base. No pleural effusion. Stable cardiomediastinal silhouette with aortic atherosclerosis. No pneumothorax. Hiatal hernia. IMPRESSION: 1. Hyperinflation with emphysematous disease. No focal pulmonary infiltrate 2. Probable moderate hiatal hernia. Electronically Signed   By: Donavan Foil M.D.   On: 07/10/2017 20:44   Ct Angio Chest Pe W And/or Wo Contrast  Result Date: 07/22/2017 CLINICAL DATA:  84 year old with increasing shortness of breath. EXAM: CT ANGIOGRAPHY CHEST WITH CONTRAST TECHNIQUE: Multidetector CT imaging of the chest was performed using the standard protocol during bolus administration of intravenous contrast. Multiplanar CT image reconstructions and MIPs were obtained to evaluate the vascular anatomy. CONTRAST:  40m ISOVUE-370 IOPAMIDOL (ISOVUE-370) INJECTION 76% COMPARISON:  Radiograph earlier this day. FINDINGS: Cardiovascular: There are no filling defects within the pulmonary arteries to suggest pulmonary embolus. Post CABG with calcification of the native coronary arteries. Heart is normal in size. Contrast refluxes into the hepatic veins and IVC. Dense atherosclerosis of the thoracic aorta with calcified and irregular plaque. Without aneurysm. No aortic dissection. No pericardial effusion. Mediastinum/Nodes: Moderate hiatal hernia. No enlarged mediastinal or hilar nodes. Trachea and mainstem bronchi are patent. Lungs/Pleura: Moderate emphysema and bronchial thickening. Small bilateral pleural effusions, left greater than right, with associated atelectasis. No pneumonic consolidation. No pulmonary edema. No pulmonary mass. Upper Abdomen: Mild contrast refluxing into the IVC and hepatic veins. Irregular plaque of the upper abdominal aorta. Musculoskeletal: Age-indeterminate mild compression fracture superior endplate of TN82 No focal bone  lesion. Review of the MIP images confirms the above findings. IMPRESSION: 1. No pulmonary embolus. 2. Moderate emphysema and central bronchial thickening. 3. Small pleural effusions and adjacent atelectasis.  4. Contrast refluxing into the hepatic veins and IVC with mild straightening of the intraventricular septum suggesting elevated right heart pressures. 5. Mild age indeterminate superior endplate compression fracture of T12. 6. Aortic atherosclerosis.  Moderate hiatal hernia. Aortic Atherosclerosis (ICD10-I70.0) and Emphysema (ICD10-J43.9). Electronically Signed   By: Jeb Levering M.D.   On: 07/22/2017 23:31   Dg Chest Portable 1 View  Result Date: 07/22/2017 CLINICAL DATA:  Respiratory distress EXAM: PORTABLE CHEST 1 VIEW COMPARISON:  July 13, 2017 FINDINGS: There is a small hiatal hernia. The heart, hila, mediastinum, lungs, and pleura are unremarkable. IMPRESSION: No active disease. Electronically Signed   By: Dorise Bullion III M.D   On: 07/22/2017 22:04   Dg Chest Portable 1 View  Result Date: 07/07/2017 CLINICAL DATA:  Respiratory distress EXAM: PORTABLE CHEST 1 VIEW COMPARISON:  05/19/2015 FINDINGS: Postoperative changes in the mediastinum. Heart size and pulmonary vascularity are normal. Probable emphysematous changes in the upper lungs. Scattered fibrosis in the lungs. No airspace disease or consolidation. No blunting of costophrenic angles. No pneumothorax. Calcification of the aorta. IMPRESSION: Emphysematous changes in the lungs. No consolidation or edema. Aortic atherosclerosis. Electronically Signed   By: Lucienne Capers M.D.   On: 07/07/2017 01:40    Assessment/Plan  Chronic obstructive pulmonary disease, unspecified COPD type (HCC)  Continue Albuterol inhaler 2 puffs Q 6 hours prn  Continue scheduled BID Xopenex neb  Continue Q 6 hr prn Xopenex neb  Continue Anoro Ellipta 62.5-25 mcg inhaler 1 puff daily  Continue O2 2L [place]  Dementia without behavioral disturbance,  unspecified dementia type  Reorient frequently  Allow pt to speak with family if needed  Continue Aricept 5 mg po Q HS  Monitor for safety  Fall precautions  Family/ staff Communication:   Total Time:  Documentation:  Face to Face:  Family/Phone:   Labs/tests ordered:  Cbc, met c  Medication list reviewed and assessed for continued appropriateness. Monthly medication orders reviewed and signed.  Vikki Ports, NP-C [facility] [phone], [place] [address] Cell Phone (Mon-Fri 8am-5pm):  [phone] On Call:  [phone] & follow prompts after 5pm & weekends Office Phone:  [phone] Office Fax:  [phone]

## 2017-08-06 ENCOUNTER — Other Ambulatory Visit
Admission: RE | Admit: 2017-08-06 | Discharge: 2017-08-06 | Disposition: A | Discharge status: Home / Self Care | Payer: Medicare HMO | Source: Skilled Nursing Facility | Attending: Internal Medicine | Admitting: Internal Medicine

## 2017-08-06 DIAGNOSIS — I959 Hypotension, unspecified: Secondary | ICD-10-CM | POA: Insufficient documentation

## 2017-08-06 LAB — CBC WITH DIFFERENTIAL/PLATELET
Basophils Absolute: 0.1 10*3/uL (ref 0–0.1)
Basophils Relative: 1 %
EOS PCT: 1 %
Eosinophils Absolute: 0 10*3/uL (ref 0–0.7)
HCT: 34.9 % — ABNORMAL LOW (ref 35.0–47.0)
Hemoglobin: 11.8 g/dL — ABNORMAL LOW (ref 12.0–16.0)
LYMPHS ABS: 0.6 10*3/uL — AB (ref 1.0–3.6)
LYMPHS PCT: 10 %
MCH: 30.8 pg (ref 26.0–34.0)
MCHC: 33.8 g/dL (ref 32.0–36.0)
MCV: 90.9 fL (ref 80.0–100.0)
MONOS PCT: 17 %
Monocytes Absolute: 1.1 10*3/uL — ABNORMAL HIGH (ref 0.2–0.9)
Neutro Abs: 4.5 10*3/uL (ref 1.4–6.5)
Neutrophils Relative %: 71 %
PLATELETS: 195 10*3/uL (ref 150–440)
RBC: 3.84 MIL/uL (ref 3.80–5.20)
RDW: 18.7 % — ABNORMAL HIGH (ref 11.5–14.5)
WBC: 6.3 10*3/uL (ref 3.6–11.0)

## 2017-08-06 LAB — COMPREHENSIVE METABOLIC PANEL
ALK PHOS: 81 U/L (ref 38–126)
ALT: 27 U/L (ref 14–54)
ANION GAP: 10 (ref 5–15)
AST: 29 U/L (ref 15–41)
Albumin: 3.6 g/dL (ref 3.5–5.0)
BUN: 54 mg/dL — ABNORMAL HIGH (ref 6–20)
CALCIUM: 9 mg/dL (ref 8.9–10.3)
CHLORIDE: 99 mmol/L — AB (ref 101–111)
CO2: 30 mmol/L (ref 22–32)
CREATININE: 1.99 mg/dL — AB (ref 0.44–1.00)
GFR, EST AFRICAN AMERICAN: 25 mL/min — AB (ref >60)
GFR, EST NON AFRICAN AMERICAN: 22 mL/min — AB (ref >60)
Glucose, Bld: 146 mg/dL — ABNORMAL HIGH (ref 65–99)
Potassium: 4.2 mmol/L (ref 3.5–5.1)
Sodium: 139 mmol/L (ref 135–145)
Total Bilirubin: 0.8 mg/dL (ref 0.3–1.2)
Total Protein: 6.1 g/dL — ABNORMAL LOW (ref 6.5–8.1)

## 2017-08-09 ENCOUNTER — Other Ambulatory Visit
Admission: RE | Admit: 2017-08-09 | Discharge: 2017-08-09 | Disposition: A | Discharge status: Home / Self Care | Payer: Medicare HMO | Source: Ambulatory Visit | Attending: Gerontology | Admitting: Gerontology

## 2017-08-09 DIAGNOSIS — I959 Hypotension, unspecified: Secondary | ICD-10-CM | POA: Insufficient documentation

## 2017-08-09 LAB — COMPREHENSIVE METABOLIC PANEL
ALT: 30 U/L (ref 14–54)
ANION GAP: 13 (ref 5–15)
AST: 25 U/L (ref 15–41)
Albumin: 3.3 g/dL — ABNORMAL LOW (ref 3.5–5.0)
Alkaline Phosphatase: 89 U/L (ref 38–126)
BILIRUBIN TOTAL: 1 mg/dL (ref 0.3–1.2)
BUN: 79 mg/dL — AB (ref 6–20)
CHLORIDE: 95 mmol/L — AB (ref 101–111)
CO2: 28 mmol/L (ref 22–32)
Calcium: 8.9 mg/dL (ref 8.9–10.3)
Creatinine, Ser: 2.59 mg/dL — ABNORMAL HIGH (ref 0.44–1.00)
GFR calc Af Amer: 18 mL/min — ABNORMAL LOW (ref >60)
GFR, EST NON AFRICAN AMERICAN: 16 mL/min — AB (ref >60)
Glucose, Bld: 138 mg/dL — ABNORMAL HIGH (ref 65–99)
POTASSIUM: 4.7 mmol/L (ref 3.5–5.1)
Sodium: 136 mmol/L (ref 135–145)
TOTAL PROTEIN: 6 g/dL — AB (ref 6.5–8.1)

## 2017-08-10 LAB — CBC WITH DIFFERENTIAL/PLATELET
BASOS PCT: 0 %
Basophils Absolute: 0 10*3/uL (ref 0–0.1)
EOS ABS: 0 10*3/uL (ref 0–0.7)
Eosinophils Relative: 1 %
HCT: 30.1 % — ABNORMAL LOW (ref 35.0–47.0)
Hemoglobin: 10.1 g/dL — ABNORMAL LOW (ref 12.0–16.0)
Lymphocytes Relative: 10 %
Lymphs Abs: 0.7 10*3/uL — ABNORMAL LOW (ref 1.0–3.6)
MCH: 30.2 pg (ref 26.0–34.0)
MCHC: 33.5 g/dL (ref 32.0–36.0)
MCV: 90.3 fL (ref 80.0–100.0)
MONOS PCT: 27 %
Monocytes Absolute: 1.9 10*3/uL — ABNORMAL HIGH (ref 0.2–0.9)
NEUTROS PCT: 62 %
Neutro Abs: 4.4 10*3/uL (ref 1.4–6.5)
PLATELETS: 167 10*3/uL (ref 150–440)
RBC: 3.34 MIL/uL — ABNORMAL LOW (ref 3.80–5.20)
RDW: 18.7 % — ABNORMAL HIGH (ref 11.5–14.5)
WBC: 7.1 10*3/uL (ref 3.6–11.0)

## 2017-08-17 ENCOUNTER — Other Ambulatory Visit
Admission: RE | Admit: 2017-08-17 | Discharge: 2017-08-17 | Disposition: A | Discharge status: Home / Self Care | Payer: Medicare HMO | Source: Ambulatory Visit | Attending: Gerontology | Admitting: Gerontology

## 2017-08-17 DIAGNOSIS — J440 Chronic obstructive pulmonary disease with acute lower respiratory infection: Secondary | ICD-10-CM | POA: Diagnosis not present

## 2017-08-17 LAB — CBC WITH DIFFERENTIAL/PLATELET
BAND NEUTROPHILS: 1 %
BLASTS: 0 %
Basophils Absolute: 0.1 10*3/uL (ref 0–0.1)
Basophils Relative: 1 %
Eosinophils Absolute: 0 10*3/uL (ref 0–0.7)
Eosinophils Relative: 0 %
HCT: 31.8 % — ABNORMAL LOW (ref 35.0–47.0)
Hemoglobin: 10.7 g/dL — ABNORMAL LOW (ref 12.0–16.0)
LYMPHS ABS: 1.1 10*3/uL (ref 1.0–3.6)
LYMPHS PCT: 19 %
MCH: 30.9 pg (ref 26.0–34.0)
MCHC: 33.6 g/dL (ref 32.0–36.0)
MCV: 91.9 fL (ref 80.0–100.0)
METAMYELOCYTES PCT: 0 %
MONO ABS: 0.7 10*3/uL (ref 0.2–0.9)
MONOS PCT: 12 %
Myelocytes: 1 %
Neutro Abs: 4 10*3/uL (ref 1.4–6.5)
Neutrophils Relative %: 66 %
OTHER: 0 %
PLATELETS: 222 10*3/uL (ref 150–440)
Promyelocytes Absolute: 0 %
RBC: 3.46 MIL/uL — AB (ref 3.80–5.20)
RDW: 18.8 % — ABNORMAL HIGH (ref 11.5–14.5)
WBC: 5.9 10*3/uL (ref 3.6–11.0)
nRBC: 0 /100 WBC

## 2017-08-17 LAB — COMPREHENSIVE METABOLIC PANEL
ALBUMIN: 3.4 g/dL — AB (ref 3.5–5.0)
ALT: 27 U/L (ref 14–54)
AST: 33 U/L (ref 15–41)
Alkaline Phosphatase: 107 U/L (ref 38–126)
Anion gap: 11 (ref 5–15)
BUN: 43 mg/dL — AB (ref 6–20)
CO2: 27 mmol/L (ref 22–32)
CREATININE: 1.69 mg/dL — AB (ref 0.44–1.00)
Calcium: 9.1 mg/dL (ref 8.9–10.3)
Chloride: 101 mmol/L (ref 101–111)
GFR calc Af Amer: 31 mL/min — ABNORMAL LOW (ref >60)
GFR, EST NON AFRICAN AMERICAN: 26 mL/min — AB (ref >60)
GLUCOSE: 169 mg/dL — AB (ref 65–99)
Potassium: 3.6 mmol/L (ref 3.5–5.1)
SODIUM: 139 mmol/L (ref 135–145)
Total Bilirubin: 0.6 mg/dL (ref 0.3–1.2)
Total Protein: 6.2 g/dL — ABNORMAL LOW (ref 6.5–8.1)

## 2017-08-17 LAB — LIPID PANEL
Cholesterol: 239 mg/dL — ABNORMAL HIGH (ref 0–200)
HDL: 54 mg/dL (ref >40)
LDL CALC: 155 mg/dL — AB (ref 0–99)
Total CHOL/HDL Ratio: 4.4 RATIO
Triglycerides: 151 mg/dL — ABNORMAL HIGH (ref <150)
VLDL: 30 mg/dL (ref 0–40)

## 2017-08-17 LAB — TSH: TSH: 2.837 u[IU]/mL (ref 0.350–4.500)

## 2017-08-17 LAB — VITAMIN B12: VITAMIN B 12: 1116 pg/mL — AB (ref 180–914)

## 2017-08-17 LAB — MAGNESIUM: MAGNESIUM: 2.2 mg/dL (ref 1.7–2.4)

## 2017-08-18 LAB — VITAMIN D 25 HYDROXY (VIT D DEFICIENCY, FRACTURES): VIT D 25 HYDROXY: 25.4 ng/mL — AB (ref 30.0–100.0)

## 2017-08-21 ENCOUNTER — Other Ambulatory Visit: Payer: Self-pay

## 2017-08-21 NOTE — Patient Outreach (Signed)
[place]) Care Management  08/21/2017  Holly Clark 10-15-1929 [id number]   Referral Date: 08/21/17 Referral Source:  [employer] Report Date of Admission: ? Diagnosis: ? Date of Discharge: 08/17/17 Facility: [place]: [place] attempt # 1 Telephone call to patient for transition of care follow up.  Called patient and son  Toniann Fail answered and states that patient is in rehab but his sisters know more and advised that CM call one of them.  Called [place].  No answer.  HIPAA compliant voice message left.     Plan: RN CM will send letter and attempt another outreach within 4 business days.     Jone Baseman, RN, MSN [facility] Care Management Care Management Coordinator Direct Line [phone] Toll Free: [phone]  Fax: [phone]

## 2017-08-23 ENCOUNTER — Other Ambulatory Visit: Payer: Self-pay

## 2017-08-23 NOTE — Patient Outreach (Addendum)
[place] [facility]) Care Management  08/23/2017  CLAUDETT BAYLY 01-23-1930 [id number]   Referral Date: 08/21/17 Referral Source:  [employer] Report Date of Admission: ? Diagnosis: ? Date of Discharge: 08/17/17 Facility: [place]: [facility]  2nd Telephone call to [employer].  No answer.  HIPAA compliant voice message left.  Plan: RN CM will attempt patient again within 5 business days.    Updated: Incoming call from daughter Royston Bake.  She is able to verify HIPAA.  She states that patient is at [facility] in the assisted living area.  She states that patient is still not able to care for herself and that her and her sister are not able to care for her. She states that patient medicaid application is pending and that a bed became available once patient medicare days ran out.  She states they have no plans right now to take patient home.  Explained to her reason for call and is thankful for the call.  She voices no questions or concerns.   Plan: RN CM will send letter and brochure for future reference. RN CM will close case and notify care management assistant of case status.     Jone Baseman, RN, MSN [facility] Care Management Care Management Coordinator Direct Line [phone] Toll Free: [phone]  Fax: [phone]

## 2017-08-28 ENCOUNTER — Other Ambulatory Visit: Payer: Self-pay

## 2017-08-28 ENCOUNTER — Ambulatory Visit: Payer: Medicare HMO | Admitting: Family

## 2017-08-28 MED ORDER — DRONABINOL 2.5 MG PO CAPS: 2.5000 mg | ORAL_CAPSULE | Freq: Two times a day (BID) | ORAL | 2 refills | Status: DC

## 2017-08-28 NOTE — Telephone Encounter (Signed)
Rx sent to [facility] phone : [phone] , fax : [phone]  

## 2017-08-29 DIAGNOSIS — I739 Peripheral vascular disease, unspecified: Secondary | ICD-10-CM | POA: Diagnosis not present

## 2017-08-29 DIAGNOSIS — G9341 Metabolic encephalopathy: Secondary | ICD-10-CM | POA: Diagnosis not present

## 2017-08-29 DIAGNOSIS — I10 Essential (primary) hypertension: Secondary | ICD-10-CM | POA: Diagnosis not present

## 2017-08-29 DIAGNOSIS — J431 Panlobular emphysema: Secondary | ICD-10-CM | POA: Diagnosis not present

## 2017-08-31 ENCOUNTER — Other Ambulatory Visit
Admission: RE | Admit: 2017-08-31 | Discharge: 2017-08-31 | Disposition: A | Discharge status: Home / Self Care | Payer: Medicare HMO | Source: Ambulatory Visit | Attending: Gerontology | Admitting: Gerontology

## 2017-08-31 DIAGNOSIS — J440 Chronic obstructive pulmonary disease with acute lower respiratory infection: Secondary | ICD-10-CM | POA: Insufficient documentation

## 2017-08-31 LAB — COMPREHENSIVE METABOLIC PANEL
ALT: 21 U/L (ref 14–54)
AST: 30 U/L (ref 15–41)
Albumin: 3.3 g/dL — ABNORMAL LOW (ref 3.5–5.0)
Alkaline Phosphatase: 161 U/L — ABNORMAL HIGH (ref 38–126)
Anion gap: 15 (ref 5–15)
BUN: 46 mg/dL — AB (ref 6–20)
CHLORIDE: 96 mmol/L — AB (ref 101–111)
CO2: 25 mmol/L (ref 22–32)
Calcium: 9.3 mg/dL (ref 8.9–10.3)
Creatinine, Ser: 1.72 mg/dL — ABNORMAL HIGH (ref 0.44–1.00)
GFR calc Af Amer: 30 mL/min — ABNORMAL LOW (ref >60)
GFR calc non Af Amer: 26 mL/min — ABNORMAL LOW (ref >60)
GLUCOSE: 165 mg/dL — AB (ref 65–99)
POTASSIUM: 3.5 mmol/L (ref 3.5–5.1)
SODIUM: 136 mmol/L (ref 135–145)
Total Bilirubin: 0.6 mg/dL (ref 0.3–1.2)
Total Protein: 6.8 g/dL (ref 6.5–8.1)

## 2017-08-31 LAB — CBC WITH DIFFERENTIAL/PLATELET
BASOS ABS: 0 10*3/uL (ref 0–0.1)
Basophils Relative: 0 %
EOS PCT: 1 %
Eosinophils Absolute: 0 10*3/uL (ref 0–0.7)
HCT: 31.9 % — ABNORMAL LOW (ref 35.0–47.0)
Hemoglobin: 10.7 g/dL — ABNORMAL LOW (ref 12.0–16.0)
Lymphocytes Relative: 9 %
Lymphs Abs: 0.7 10*3/uL — ABNORMAL LOW (ref 1.0–3.6)
MCH: 30.3 pg (ref 26.0–34.0)
MCHC: 33.6 g/dL (ref 32.0–36.0)
MCV: 90.2 fL (ref 80.0–100.0)
MONO ABS: 2 10*3/uL — AB (ref 0.2–0.9)
Monocytes Relative: 25 %
Neutro Abs: 5.4 10*3/uL (ref 1.4–6.5)
Neutrophils Relative %: 65 %
PLATELETS: 323 10*3/uL (ref 150–440)
RBC: 3.54 MIL/uL — ABNORMAL LOW (ref 3.80–5.20)
RDW: 17.1 % — AB (ref 11.5–14.5)
WBC: 8.2 10*3/uL (ref 3.6–11.0)

## 2017-09-02 DIAGNOSIS — R062 Wheezing: Secondary | ICD-10-CM | POA: Diagnosis not present

## 2017-09-04 ENCOUNTER — Encounter
Admission: RE | Admit: 2017-09-04 | Discharge: 2017-09-04 | Disposition: A | Discharge status: Home / Self Care | Payer: Medicare HMO | Source: Ambulatory Visit | Attending: Internal Medicine | Admitting: Internal Medicine

## 2017-09-05 ENCOUNTER — Non-Acute Institutional Stay (SKILLED_NURSING_FACILITY): Payer: Medicare HMO | Admitting: Gerontology

## 2017-09-05 ENCOUNTER — Encounter: Payer: Self-pay | Admitting: Gerontology

## 2017-09-05 DIAGNOSIS — D500 Iron deficiency anemia secondary to blood loss (chronic): Secondary | ICD-10-CM

## 2017-09-05 DIAGNOSIS — J449 Chronic obstructive pulmonary disease, unspecified: Secondary | ICD-10-CM | POA: Diagnosis not present

## 2017-09-05 DIAGNOSIS — R630 Anorexia: Secondary | ICD-10-CM | POA: Diagnosis not present

## 2017-09-05 NOTE — Progress Notes (Signed)
Location:    [facility] Room Number: [place] of Service:  SNF [phone]) Provider:  Toni Arthurs, NP-C  Rusty Aus, MD  Patient Care Team: Rusty Aus, MD as PCP - General (Internal Medicine)  Extended Emergency Contact Information Primary Emergency Contact: Drema Balzarine Address: [address]          [place], [place] [address] Home Phone: [phone] Mobile Phone: [phone] Relation: Daughter Secondary Emergency Contact: [place] Phone: [phone] Mobile Phone: [phone] Relation: Daughter  Code Status: FULL Goals of care: Advanced Directive information Advanced Directives 09/05/2017  Does Patient Have a Medical Advance Directive? Yes  Type of Paramedic of [place];Living will  Does patient want to make changes to medical advance directive? No - Patient declined  Copy of [place] in Chart? Yes  Would patient like information on creating a medical advance directive? -     Chief Complaint  Patient presents with  . Medical Management of Chronic Issues    Routine Visit    HPI:  Pt is a 84 y.o. female seen today for medical management of chronic diseases.    Chronic obstructive pulmonary disease, unspecified COPD type ([place]) Stable. On O2 2L [place]. Continue Anoro Ellipta, Albuterol prn, Xopenex scheduled BID and Q 6 hours prn  Poor appetite Persistent. Pt is on Marinol 2.5 mg po BID. Still only eating bites or maybe 1 meal per day. On Ensure Enlive 1 bottle BID, Pro-stat 30 mL po BID. Will add Eldertonic   Iron deficiency anemia due to chronic blood loss Stable. Last Hgb 10.7. On Ferrous Sulfate 325 mg po BID. No complaints of fatigue or new weakness   Past Medical History:  Diagnosis Date  . Anxiety    unspecified  . Atrial fibrillation ([place])   . COPD (chronic obstructive pulmonary disease) ([place]) 10/05/2013  . Coronary artery disease 10/05/2013  . Dementia   .  Depression   . Diastolic heart failure ([place])   . Hyperlipidemia, unspecified   . Hypertension 10/05/2013  . Hypothyroidism   . Hypothyroidism, acquired    unspecified  . PVD (peripheral vascular disease) ([place]) 10/05/2013   iliac stents   Past Surgical History:  Procedure Laterality Date  . ABDOMINAL HYSTERECTOMY    . CARDIAC SURGERY    . CARDIOVERSION    . CATARACT EXTRACTION Bilateral 2012  . CORONARY ARTERY BYPASS GRAFT  1985   x3  . ESOPHAGOGASTRODUODENOSCOPY N/A 07/09/2017   Procedure: ESOPHAGOGASTRODUODENOSCOPY (EGD);  Surgeon: Lin Landsman, MD;  Location: [facility] ENDOSCOPY;  Service: Gastroenterology;  Laterality: N/A;  . [place]   patient unsure of which side  . HIP PINNING,CANNULATED Right 05/19/2015   Procedure: CANNULATED HIP PINNING;  Surgeon: Hessie Knows, MD;  Location: [facility] ORS;  Service: Orthopedics;  Laterality: Right;    Allergies  Allergen Reactions  . Hydrocodone Other (See Comments)  . Oxycodone Hives    Allergies as of 09/05/2017      Reactions   Hydrocodone Other (See Comments)   Oxycodone Hives      Medication List        Accurate as of 09/05/17  2:02 PM. Always use your most recent med list.          acetaminophen 325 MG tablet Commonly known as:  TYLENOL Take 650 mg by mouth every 4 (four) hours as needed. for pain/ increased temp. May be administered orally, per G-tube if needed or rectally if unable  to swallow (separate order). Maximum dose for 24 hours is 3,000 mg from all sources of Acetaminophen/ Tylenol   albuterol 108 (90 Base) MCG/ACT inhaler Commonly known as:  PROVENTIL HFA;VENTOLIN HFA Inhale 2 puffs into the lungs every 6 (six) hours as needed for wheezing or shortness of breath.   ALPRAZolam 0.25 MG tablet Commonly known as:  XANAX Take 0.25 mg by mouth 3 (three) times daily as needed for anxiety.   alum & mag hydroxide-simeth 200-200-20 MG/5ML suspension Commonly known as:  MAALOX/MYLANTA Take 30 mLs  by mouth every 4 (four) hours as needed for indigestion.   ANORO ELLIPTA 62.5-25 MCG/INH Aepb Generic drug:  umeclidinium-vilanterol Inhale 1 puff into the lungs daily.   ASPERCREME LIDOCAINE 4 % Ptch Generic drug:  Lidocaine Apply 1 patch to the Right Hip Daily (area of the greater trochanter) daily. Remove after 12 hours   aspirin EC 81 MG tablet Take 81 mg by mouth daily.   azithromycin 250 MG tablet Commonly known as:  ZITHROMAX Take 250 mg by mouth daily.   bisacodyl 10 MG suppository Commonly known as:  DULCOLAX Place 10 mg rectally daily as needed.   buPROPion 75 MG tablet Commonly known as:  WELLBUTRIN Take 75 mg by mouth daily.   DERMACLOUD Crea Apply liberal amount topically to area of skin irritation daily as needed. OK to leave at bedside.   docusate sodium 100 MG capsule Commonly known as:  COLACE Take 1 capsule (100 mg total) by mouth 2 (two) times daily.   donepezil 5 MG tablet Commonly known as:  ARICEPT Take 5 mg by mouth daily.   dronabinol 2.5 MG capsule Commonly known as:  MARINOL Take 2.5 mg by mouth 2 (two) times daily with a meal.   ENDIT EX Apply topically. Apply 1 application topically BID and PRN to peri-area/buttocks   feeding supplement (ENSURE ENLIVE) Liqd Take 237 mLs by mouth 2 (two) times daily. give with a meal for nutrition   feeding supplement (PRO-STAT SUGAR FREE 64) Liqd Take 30 mLs by mouth 2 (two) times daily between meals.   ferrous sulfate 325 (65 FE) MG tablet Take 1 tablet (325 mg total) by mouth 2 (two) times daily with a meal.   furosemide 20 MG tablet Commonly known as:  LASIX Take 1 tablet (20 mg total) by mouth daily.   geriatric multivitamins-minerals Elix Take 15 mLs by mouth daily with breakfast.   levalbuterol 1.25 MG/3ML nebulizer solution Commonly known as:  XOPENEX Take 1.25 mg by nebulization 2 (two) times daily.   levalbuterol 1.25 MG/3ML nebulizer solution Commonly known as:  XOPENEX Take 1.25  mg by nebulization every 6 (six) hours as needed for wheezing.   levothyroxine 75 MCG tablet Commonly known as:  SYNTHROID, LEVOTHROID Take 1 tablet (75 mcg total) by mouth daily before breakfast.   nitroGLYCERIN 0.4 MG SL tablet Commonly known as:  NITROSTAT Place 0.4 mg under the tongue every 5 (five) minutes as needed for chest pain.   OXYGEN Inhale 2 L into the lungs continuous.   pantoprazole 40 MG tablet Commonly known as:  PROTONIX Take 1 tablet (40 mg total) by mouth 2 (two) times daily.   sertraline 50 MG tablet Commonly known as:  ZOLOFT Take 75 mg by mouth daily. 1 and 1/2 tab   Vitamin D 2000 units Caps Take 1 capsule by mouth daily.       Review of Systems  Constitutional: Negative for activity change, appetite change, chills, diaphoresis and fever.  HENT: Negative for congestion, mouth sores, nosebleeds, postnasal drip, sneezing, sore throat, trouble swallowing and voice change.   Respiratory: Positive for shortness of breath. Negative for apnea, cough, choking, chest tightness and wheezing.   Cardiovascular: Negative for chest pain, palpitations and leg swelling.  Gastrointestinal: Negative for abdominal distention, abdominal pain, constipation, diarrhea and nausea.  Genitourinary: Negative for difficulty urinating, dysuria, frequency and urgency.  Musculoskeletal: Positive for arthralgias (typical arthritis). Negative for back pain, gait problem and myalgias.  Skin: Negative for color change, pallor, rash and wound.  Neurological: Positive for weakness. Negative for dizziness, tremors, syncope, speech difficulty, numbness and headaches.  Psychiatric/Behavioral: Negative for agitation and behavioral problems.  All other systems reviewed and are negative.   Immunization History  Administered Date(s) Administered  . Influenza Split 03/31/2014  . Influenza,inj,Quad PF,6+ Mos 05/20/2015  . Pneumococcal Polysaccharide-23 03/31/2014   Pertinent  Health  Maintenance Due  Topic Date Due  . DEXA SCAN  05/25/1997  . PNA vac Low Risk Adult (2 of 2 - PCV13) 04/01/2015  . INFLUENZA VACCINE  01/04/2018   Fall Risk  08/01/2017  Falls in the past year? No   Functional Status Survey:    Vitals:   09/05/17 1333  BP: 109/61  Pulse: 94  Resp: (!) 28  Temp: 98.6 F (37 C)  TempSrc: Oral  SpO2: 99%  Weight: 95 lb 4.8 oz (43.2 kg)  Height: 5' (1.524 m)   Body mass index is 18.61 kg/m. Physical Exam  Constitutional: She is oriented to person, place, and time. Vital signs are normal. She appears well-developed and well-nourished. She is active and cooperative. She does not appear ill. No distress.  HENT:  Head: Normocephalic and atraumatic.  Mouth/Throat: Uvula is midline, oropharynx is clear and moist and mucous membranes are normal. Mucous membranes are not pale, not dry and not cyanotic.  Eyes: Pupils are equal, round, and reactive to light. Conjunctivae, EOM and lids are normal.  Neck: Trachea normal, normal range of motion and full passive range of motion without pain. Neck supple. No JVD present. No tracheal deviation, no edema and no erythema present. No thyromegaly present.  Cardiovascular: Normal rate, normal heart sounds, intact distal pulses and normal pulses. An irregular rhythm present. Exam reveals no gallop, no distant heart sounds and no friction rub.  No murmur heard. Pulses:      Dorsalis pedis pulses are 2+ on the right side, and 2+ on the left side.  No edema  Pulmonary/Chest: Effort normal and breath sounds normal. No accessory muscle usage. No respiratory distress. She has no decreased breath sounds. She has no wheezes. She has no rhonchi. She has no rales. She exhibits no tenderness.  Abdominal: Soft. Normal appearance and bowel sounds are normal. She exhibits no distension and no ascites. There is no tenderness.  Musculoskeletal: Normal range of motion. She exhibits no edema or tenderness.  Expected osteoarthritis,  stiffness; Bilateral Calves soft, supple. Negative Homan's Sign. B- pedal pulses equal; generalized weakness;   Neurological: She is alert and oriented to person, place, and time. She has normal strength. She exhibits abnormal muscle tone. Coordination and gait abnormal.  Skin: Skin is warm, dry and intact. She is not diaphoretic. No cyanosis. No pallor. Nails show no clubbing.  Psychiatric: She has a normal mood and affect. Her speech is normal and behavior is normal. Judgment and thought content normal. Cognition and memory are impaired. She exhibits abnormal recent memory.  Nursing note and vitals reviewed.   Labs reviewed:  Recent Labs    07/13/17 1550  08/09/17 2230 08/17/17 1535 08/31/17 1208  NA  --    < > 136 139 136  K  --    < > 4.7 3.6 3.5  CL  --    < > 95* 101 96*  CO2  --    < > 28 27 25   GLUCOSE  --    < > 138* 169* 165*  BUN  --    < > 79* 43* 46*  CREATININE  --    < > 2.59* 1.69* 1.72*  CALCIUM  --    < > 8.9 9.1 9.3  MG 2.2  --   --  2.2  --    < > = values in this interval not displayed.   Recent Labs    08/09/17 2230 08/17/17 1535 08/31/17 1208  AST 25 33 30  ALT 30 27 21   ALKPHOS 89 107 161*  BILITOT 1.0 0.6 0.6  PROT 6.0* 6.2* 6.8  ALBUMIN 3.3* 3.4* 3.3*   Recent Labs    08/09/17 2230 08/17/17 1535 08/31/17 1208  WBC 7.1 5.9 8.2  NEUTROABS 4.4 4.0 5.4  HGB 10.1* 10.7* 10.7*  HCT 30.1* 31.8* 31.9*  MCV 90.3 91.9 90.2  PLT 167 222 323   Lab Results  Component Value Date   TSH 2.837 08/17/2017   No results found for: HGBA1C Lab Results  Component Value Date   CHOL 239 (H) 08/17/2017   HDL 54 08/17/2017   LDLCALC 155 (H) 08/17/2017   TRIG 151 (H) 08/17/2017   CHOLHDL 4.4 08/17/2017    Significant Diagnostic Results in last 30 days:  No results found.  Assessment/Plan  Chronic obstructive pulmonary disease, unspecified COPD type (H[facility])  Poor appetite  Iron deficiency anemia due to chronic blood loss   Above conditions  unstable/ progressive, except anemia  Continue current medication regimen, except  Add Eldertonic 15 PO TID- with meals  Continue O2 2L [place]  Continue to encourage po intake of food and fluids  Monitor for dyspnea  Continue oral supplements  Continue to be followed by Palliative Medicine  Family/ staff Communication:   Total Time:  Documentation:  Face to Face:  Family/Phone:   Labs/tests ordered:  Recent labs reveiewed  Medication list reviewed and assessed for continued appropriateness. Monthly medication orders reviewed and signed.  Vikki Ports, NP-C Geriatrics [facility] [phone], [place] [address] Cell Phone (Mon-Fri 8am-5pm):  [phone] On Call:  [phone] & follow prompts after 5pm & weekends Office Phone:  [phone] Office Fax:  [phone]

## 2017-09-06 DIAGNOSIS — R630 Anorexia: Secondary | ICD-10-CM | POA: Insufficient documentation

## 2017-09-06 NOTE — Assessment & Plan Note (Signed)
Stable. On O2 2L [place]. Continue Anoro Ellipta, Albuterol prn, Xopenex scheduled BID and Q 6 hours prn

## 2017-09-06 NOTE — Assessment & Plan Note (Signed)
Persistent. Pt is on Marinol 2.5 mg po BID. Still only eating bites or maybe 1 meal per day. On Ensure Enlive 1 bottle BID, Pro-stat 30 mL po BID. Will add Eldertonic

## 2017-09-06 NOTE — Assessment & Plan Note (Signed)
Stable. Last Hgb 10.7. On Ferrous Sulfate 325 mg po BID. No complaints of fatigue or new weakness

## 2017-10-04 ENCOUNTER — Encounter
Admission: RE | Admit: 2017-10-04 | Discharge: 2017-10-04 | Disposition: A | Discharge status: Home / Self Care | Payer: Medicare HMO | Source: Ambulatory Visit | Attending: Internal Medicine | Admitting: Internal Medicine

## 2017-10-11 ENCOUNTER — Encounter: Payer: Self-pay | Admitting: Gerontology

## 2017-10-11 ENCOUNTER — Non-Acute Institutional Stay (SKILLED_NURSING_FACILITY): Payer: Medicare HMO | Admitting: Gerontology

## 2017-10-11 DIAGNOSIS — I480 Paroxysmal atrial fibrillation: Secondary | ICD-10-CM | POA: Diagnosis not present

## 2017-10-11 DIAGNOSIS — I10 Essential (primary) hypertension: Secondary | ICD-10-CM | POA: Diagnosis not present

## 2017-10-11 DIAGNOSIS — I5032 Chronic diastolic (congestive) heart failure: Secondary | ICD-10-CM | POA: Diagnosis not present

## 2017-10-11 NOTE — Progress Notes (Signed)
Location:   [facility] Room Number: [place] of Service:  [phone]) Provider:  Toni Arthurs, NP-C  Rusty Aus, MD  Patient Care Team: Rusty Aus, MD as PCP - General (Internal Medicine)  Extended Emergency Contact Information Primary Emergency Contact: Drema Balzarine Address: [address]          [place], [place] [address] Home Phone: [phone] Mobile Phone: [phone] Relation: Daughter Secondary Emergency Contact: [place] Phone: [phone] Mobile Phone: [phone] Relation: Daughter  Code Status:  FULL Goals of care: Advanced Directive information Advanced Directives 10/11/2017  Does Patient Have a Medical Advance Directive? Yes  Type of Paramedic of [place];Living will  Does patient want to make changes to medical advance directive? No - Patient declined  Copy of [place] in Chart? Yes  Would patient like information on creating a medical advance directive? -     Chief Complaint  Patient presents with  . Medical Management of Chronic Issues    Routine Visit    HPI:  Pt is a 84 y.o. female seen today for medical management of chronic diseases.    Essential hypertension Stable. No recent episodes of hyper or hypotension. Symptoms controlled with diuretic only. Denies chest pain or shortness of breath.  Chronic diastolic heart failure (HCC) Stable. No extremity edema at present. Symptoms controlled with Lasix 20 mg PO Q Day. Denies chest pain or shortness of breath.   Paroxysmal atrial fibrillation (HCC) Stable. No recent exacerbations. Not on rate controlling medications. Uses ASA 81 mg po Q Day  Please note pt with limited verbal/cognitive ability. Unable to obtain complete ROS. Some ROS info obtained from staff and documentation.   Past Medical History:  Diagnosis Date  . Anxiety    unspecified  . Atrial fibrillation ([place])   . COPD (chronic obstructive  pulmonary disease) ([place]) 10/05/2013  . Coronary artery disease 10/05/2013  . Dementia   . Depression   . Diastolic heart failure ([place])   . Hyperlipidemia, unspecified   . Hypertension 10/05/2013  . Hypothyroidism   . Hypothyroidism, acquired    unspecified  . PVD (peripheral vascular disease) ([place]) 10/05/2013   iliac stents   Past Surgical History:  Procedure Laterality Date  . ABDOMINAL HYSTERECTOMY    . CARDIAC SURGERY    . CARDIOVERSION    . CATARACT EXTRACTION Bilateral 2012  . CORONARY ARTERY BYPASS GRAFT  1985   x3  . ESOPHAGOGASTRODUODENOSCOPY N/A 07/09/2017   Procedure: ESOPHAGOGASTRODUODENOSCOPY (EGD);  Surgeon: Lin Landsman, MD;  Location: [facility] ENDOSCOPY;  Service: Gastroenterology;  Laterality: N/A;  . [place]   patient unsure of which side  . HIP PINNING,CANNULATED Right 05/19/2015   Procedure: CANNULATED HIP PINNING;  Surgeon: Hessie Knows, MD;  Location: [facility] ORS;  Service: Orthopedics;  Laterality: Right;    Allergies  Allergen Reactions  . Hydrocodone Other (See Comments)  . Oxycodone Hives    Allergies as of 10/11/2017      Reactions   Hydrocodone Other (See Comments)   Oxycodone Hives      Medication List        Accurate as of 10/11/17 10:31 AM. Always use your most recent med list.          acetaminophen 325 MG tablet Commonly known as:  TYLENOL Take 650 mg by mouth every 4 (four) hours as needed. for pain/ increased temp. May be administered orally, per G-tube if needed or rectally  if unable to swallow (separate order). Maximum dose for 24 hours is 3,000 mg from all sources of Acetaminophen/ Tylenol   albuterol 108 (90 Base) MCG/ACT inhaler Commonly known as:  PROVENTIL HFA;VENTOLIN HFA Inhale 2 puffs into the lungs every 6 (six) hours as needed for wheezing or shortness of breath.   ALPRAZolam 0.25 MG tablet Commonly known as:  XANAX Take 0.25 mg by mouth 3 (three) times daily as needed for anxiety.   alum & mag  hydroxide-simeth 200-200-20 MG/5ML suspension Commonly known as:  MAALOX/MYLANTA Take 30 mLs by mouth every 4 (four) hours as needed for indigestion.   ANORO ELLIPTA 62.5-25 MCG/INH Aepb Generic drug:  umeclidinium-vilanterol Inhale 1 puff into the lungs daily.   ASPERCREME LIDOCAINE 4 % Ptch Generic drug:  Lidocaine Apply 1 patch to the Right Hip Daily (area of the greater trochanter) daily. Remove after 12 hours   aspirin EC 81 MG tablet Take 81 mg by mouth daily.   bisacodyl 10 MG suppository Commonly known as:  DULCOLAX Place 10 mg rectally daily as needed.   buPROPion 75 MG tablet Commonly known as:  WELLBUTRIN Take 75 mg by mouth daily.   DERMACLOUD Crea Apply liberal amount topically to area of skin irritation daily as needed. OK to leave at bedside.   docusate sodium 100 MG capsule Commonly known as:  COLACE Take 1 capsule (100 mg total) by mouth 2 (two) times daily.   donepezil 5 MG tablet Commonly known as:  ARICEPT Take 5 mg by mouth daily.   dronabinol 2.5 MG capsule Commonly known as:  MARINOL Take 2.5 mg by mouth 2 (two) times daily with a meal.   ENDIT EX Apply topically. Apply 1 application topically BID and PRN to peri-area/buttocks   feeding supplement (ENSURE ENLIVE) Liqd Take 237 mLs by mouth 2 (two) times daily. give with a meal for nutrition   feeding supplement (PRO-STAT SUGAR FREE 64) Liqd Take 30 mLs by mouth 2 (two) times daily between meals.   ferrous sulfate 325 (65 FE) MG tablet Take 1 tablet (325 mg total) by mouth 2 (two) times daily with a meal.   furosemide 20 MG tablet Commonly known as:  LASIX Take 1 tablet (20 mg total) by mouth daily.   geriatric multivitamins-minerals Elix Take 15 mLs by mouth daily.   levalbuterol 1.25 MG/3ML nebulizer solution Commonly known as:  XOPENEX Take 1.25 mg by nebulization 2 (two) times daily.   levalbuterol 1.25 MG/3ML nebulizer solution Commonly known as:  XOPENEX Take 1.25 mg by  nebulization every 6 (six) hours as needed for wheezing.   levothyroxine 75 MCG tablet Commonly known as:  SYNTHROID, LEVOTHROID Take 1 tablet (75 mcg total) by mouth daily before breakfast.   nitroGLYCERIN 0.4 MG SL tablet Commonly known as:  NITROSTAT Place 0.4 mg under the tongue every 5 (five) minutes as needed for chest pain.   OXYGEN Inhale 2 L into the lungs continuous.   pantoprazole 40 MG tablet Commonly known as:  PROTONIX Take 1 tablet (40 mg total) by mouth 2 (two) times daily.   sertraline 50 MG tablet Commonly known as:  ZOLOFT Take 75 mg by mouth daily. 1 and 1/2 tab   Vitamin D 2000 units Caps Take 1 capsule by mouth daily.       Review of Systems  Unable to perform ROS: Dementia  Constitutional: Negative for activity change, appetite change, chills, diaphoresis and fever.  HENT: Negative for congestion, mouth sores, nosebleeds, postnasal drip, sneezing, sore  throat, trouble swallowing and voice change.   Respiratory: Negative for apnea, cough, choking, chest tightness, shortness of breath and wheezing.   Cardiovascular: Negative for chest pain, palpitations and leg swelling.  Gastrointestinal: Negative for abdominal distention, abdominal pain, constipation, diarrhea and nausea.  Genitourinary: Negative for difficulty urinating, dysuria, frequency and urgency.  Musculoskeletal: Positive for arthralgias (typical arthritis) and gait problem. Negative for back pain and myalgias.  Skin: Negative for color change, pallor, rash and wound.  Neurological: Positive for weakness. Negative for dizziness, tremors, syncope, speech difficulty, numbness and headaches.  Psychiatric/Behavioral: Negative for agitation and behavioral problems.  All other systems reviewed and are negative.   Immunization History  Administered Date(s) Administered  . Influenza Split 03/31/2014  . Influenza,inj,Quad PF,6+ Mos 05/20/2015  . Pneumococcal Polysaccharide-23 03/31/2014    Pertinent  Health Maintenance Due  Topic Date Due  . DEXA SCAN  05/25/1997  . PNA vac Low Risk Adult (2 of 2 - PCV13) 04/01/2015  . INFLUENZA VACCINE  01/04/2018   Fall Risk  08/01/2017  Falls in the past year? No   Functional Status Survey:    Vitals:   10/11/17 1016  BP: 128/77  Pulse: 88  Resp: 16  Temp: 98.3 F (36.8 C)  TempSrc: Oral  SpO2: 99%  Weight: 95 lb 3.2 oz (43.2 kg)  Height: 5' (1.524 m)   Body mass index is 18.59 kg/m. Physical Exam  Constitutional: She is oriented to person, place, and time. Vital signs are normal. She appears well-developed and well-nourished. She is active and cooperative. She does not appear ill. No distress.  HENT:  Head: Normocephalic and atraumatic.  Mouth/Throat: Uvula is midline, oropharynx is clear and moist and mucous membranes are normal. Mucous membranes are not pale, not dry and not cyanotic.  Eyes: Pupils are equal, round, and reactive to light. Conjunctivae, EOM and lids are normal.  Neck: Trachea normal, normal range of motion and full passive range of motion without pain. Neck supple. No JVD present. No tracheal deviation, no edema and no erythema present. No thyromegaly present.  Cardiovascular: Normal rate, regular rhythm, normal heart sounds, intact distal pulses and normal pulses. Exam reveals no gallop, no distant heart sounds and no friction rub.  No murmur heard. Pulses:      Dorsalis pedis pulses are 2+ on the right side, and 2+ on the left side.  No edema  Pulmonary/Chest: Effort normal and breath sounds normal. No accessory muscle usage. No respiratory distress. She has no decreased breath sounds. She has no wheezes. She has no rhonchi. She has no rales. She exhibits no tenderness.  Abdominal: Soft. Normal appearance and bowel sounds are normal. She exhibits no distension and no ascites. There is no tenderness.  Musculoskeletal: Normal range of motion. She exhibits no edema or tenderness.  Expected  osteoarthritis, stiffness; Bilateral Calves soft, supple. Negative Homan's Sign. B- pedal pulses equal; generalized weakness, mobile in wheelchair  Neurological: She is alert and oriented to person, place, and time. She has normal strength. She displays atrophy. She exhibits abnormal muscle tone. Coordination abnormal.  Skin: Skin is warm, dry and intact. She is not diaphoretic. No cyanosis. No pallor. Nails show no clubbing.  Psychiatric: Her speech is normal and behavior is normal. Judgment and thought content normal. Her affect is blunt. Cognition and memory are impaired. She exhibits abnormal recent memory.  Nursing note and vitals reviewed.   Labs reviewed: Recent Labs    07/13/17 1550  08/09/17 2230 08/17/17 1535 08/31/17 1208  NA  --    < > 136 139 136  K  --    < > 4.7 3.6 3.5  CL  --    < > 95* 101 96*  CO2  --    < > 28 27 25   GLUCOSE  --    < > 138* 169* 165*  BUN  --    < > 79* 43* 46*  CREATININE  --    < > 2.59* 1.69* 1.72*  CALCIUM  --    < > 8.9 9.1 9.3  MG 2.2  --   --  2.2  --    < > = values in this interval not displayed.   Recent Labs    08/09/17 2230 08/17/17 1535 08/31/17 1208  AST 25 33 30  ALT 30 27 21   ALKPHOS 89 107 161*  BILITOT 1.0 0.6 0.6  PROT 6.0* 6.2* 6.8  ALBUMIN 3.3* 3.4* 3.3*   Recent Labs    08/09/17 2230 08/17/17 1535 08/31/17 1208  WBC 7.1 5.9 8.2  NEUTROABS 4.4 4.0 5.4  HGB 10.1* 10.7* 10.7*  HCT 30.1* 31.8* 31.9*  MCV 90.3 91.9 90.2  PLT 167 222 323   Lab Results  Component Value Date   TSH 2.837 08/17/2017   No results found for: HGBA1C Lab Results  Component Value Date   CHOL 239 (H) 08/17/2017   HDL 54 08/17/2017   LDLCALC 155 (H) 08/17/2017   TRIG 151 (H) 08/17/2017   CHOLHDL 4.4 08/17/2017    Significant Diagnostic Results in last 30 days:  No results found.  Assessment/Plan  Essential hypertension  Chronic diastolic heart failure (HCC)  Paroxysmal atrial fibrillation (HCC)   Above listed  conditions stable  Continue current medication regimen  Mobile with mechanical lift and wheelchair  Continue to assist with ADLs as appropriate  Safety precautions  Fall precautions  Family/ staff Communication:   Total Time:  Documentation:  Face to Face:  Family/Phone:   Labs/tests ordered:  Recent labs reviewed- stable  Medication list reviewed and assessed for continued appropriateness. Monthly medication orders reviewed and signed.  Vikki Ports, NP-C Geriatrics [facility] [phone] [place],  [address] Cell Phone (Mon-Fri 8am-5pm):  [phone] On Call:  [phone] & follow prompts after 5pm & weekends Office Phone:  [phone] Office Fax:  [phone]

## 2017-10-12 NOTE — Assessment & Plan Note (Signed)
Stable. No extremity edema at present. Symptoms controlled with Lasix 20 mg PO Q Day. Denies chest pain or shortness of breath.

## 2017-10-12 NOTE — Assessment & Plan Note (Signed)
Stable. No recent exacerbations. Not on rate controlling medications. Uses ASA 81 mg po Q Day

## 2017-10-12 NOTE — Assessment & Plan Note (Signed)
Stable. No recent episodes of hyper or hypotension. Symptoms controlled with diuretic only. Denies chest pain or shortness of breath.

## 2017-10-20 DIAGNOSIS — I480 Paroxysmal atrial fibrillation: Secondary | ICD-10-CM | POA: Diagnosis not present

## 2017-10-20 DIAGNOSIS — I739 Peripheral vascular disease, unspecified: Secondary | ICD-10-CM | POA: Diagnosis not present

## 2017-10-20 DIAGNOSIS — E782 Mixed hyperlipidemia: Secondary | ICD-10-CM | POA: Diagnosis not present

## 2017-10-20 DIAGNOSIS — I10 Essential (primary) hypertension: Secondary | ICD-10-CM | POA: Diagnosis not present

## 2017-10-20 DIAGNOSIS — I25810 Atherosclerosis of coronary artery bypass graft(s) without angina pectoris: Secondary | ICD-10-CM | POA: Diagnosis not present

## 2017-10-26 DIAGNOSIS — J441 Chronic obstructive pulmonary disease with (acute) exacerbation: Secondary | ICD-10-CM | POA: Diagnosis not present

## 2017-10-26 DIAGNOSIS — Z515 Encounter for palliative care: Secondary | ICD-10-CM | POA: Diagnosis not present

## 2017-11-04 ENCOUNTER — Encounter
Admission: RE | Admit: 2017-11-04 | Discharge: 2017-11-04 | Disposition: A | Discharge status: Home / Self Care | Payer: Medicare HMO | Source: Ambulatory Visit | Attending: Internal Medicine | Admitting: Internal Medicine

## 2017-11-15 ENCOUNTER — Non-Acute Institutional Stay (SKILLED_NURSING_FACILITY): Payer: Medicare HMO | Admitting: Gerontology

## 2017-11-15 ENCOUNTER — Encounter: Payer: Self-pay | Admitting: Gerontology

## 2017-11-15 DIAGNOSIS — K449 Diaphragmatic hernia without obstruction or gangrene: Secondary | ICD-10-CM | POA: Diagnosis not present

## 2017-11-15 DIAGNOSIS — K2210 Ulcer of esophagus without bleeding: Secondary | ICD-10-CM

## 2017-11-15 DIAGNOSIS — F0390 Unspecified dementia without behavioral disturbance: Secondary | ICD-10-CM | POA: Diagnosis not present

## 2017-11-15 NOTE — Progress Notes (Signed)
Location:   [facility] Room Number: [phone] Place of Service:  SNF [phone]) Provider:  Toni Arthurs, NP-C  Kirk Ruths, MD  Patient Care Team: Kirk Ruths, MD as PCP - General (Internal Medicine) Toni Arthurs, NP as Nurse Practitioner (Family Medicine)  Extended Emergency Contact Information Primary Emergency Contact: Drema Balzarine Address: [address]          [place], [place] [address] Home Phone: [phone] Mobile Phone: [phone] Relation: Daughter Secondary Emergency Contact: [place] Phone: [phone] Mobile Phone: [phone] Relation: Daughter  Code Status: FULL Goals of care: Advanced Directive information Advanced Directives 11/15/2017  Does Patient Have a Medical Advance Directive? Yes  Type of Paramedic of [place];Living will  Does patient want to make changes to medical advance directive? No - Patient declined  Copy of [place] in Chart? Yes  Would patient like information on creating a medical advance directive? -     Chief Complaint  Patient presents with  . Medical Management of Chronic Issues    Routine Visit    HPI:  Pt is a 84 y.o. female seen today for medical management of chronic diseases.    Dementia without behavioral disturbance, unspecified dementia type Stable. No negative behaviors noted. Pleasant. On Aricept 5 mg po Q HS  Esophagitis, erosive Stable. No recent c/o exacerbation of symptoms. Not currently on tx.   Large hiatal hernia Stable. No recent c/o reflux or dysphagia.   Past Medical History:  Diagnosis Date  . Anxiety    unspecified  . Atrial fibrillation ([place])   . COPD (chronic obstructive pulmonary disease) ([place]) 10/05/2013  . Coronary artery disease 10/05/2013  . Dementia   . Depression   . Diastolic heart failure ([place])   . Hyperlipidemia, unspecified   . Hypertension 10/05/2013  . Hypothyroidism   .  Hypothyroidism, acquired    unspecified  . PVD (peripheral vascular disease) ([place]) 10/05/2013   iliac stents   Past Surgical History:  Procedure Laterality Date  . ABDOMINAL HYSTERECTOMY    . CARDIAC SURGERY    . CARDIOVERSION    . CATARACT EXTRACTION Bilateral 2012  . CORONARY ARTERY BYPASS GRAFT  1985   x3  . ESOPHAGOGASTRODUODENOSCOPY N/A 07/09/2017   Procedure: ESOPHAGOGASTRODUODENOSCOPY (EGD);  Surgeon: Lin Landsman, MD;  Location: [facility] ENDOSCOPY;  Service: Gastroenterology;  Laterality: N/A;  . [place]   patient unsure of which side  . HIP PINNING,CANNULATED Right 05/19/2015   Procedure: CANNULATED HIP PINNING;  Surgeon: Hessie Knows, MD;  Location: [facility] ORS;  Service: Orthopedics;  Laterality: Right;    Allergies  Allergen Reactions  . Hydrocodone Other (See Comments)  . Oxycodone Hives    Allergies as of 11/15/2017      Reactions   Hydrocodone Other (See Comments)   Oxycodone Hives      Medication List        Accurate as of 11/15/17 12:34 PM. Always use your most recent med list.          acetaminophen 325 MG tablet Commonly known as:  TYLENOL Take 650 mg by mouth every 4 (four) hours as needed. for pain/ increased temp. May be administered orally, per G-tube if needed or rectally if unable to swallow (separate order). Maximum dose for 24 hours is 3,000 mg from all sources of Acetaminophen/ Tylenol   albuterol 108 (90 Base) MCG/ACT inhaler Commonly known as:  PROVENTIL HFA;VENTOLIN HFA Inhale 2 puffs into the  lungs every 6 (six) hours as needed for wheezing or shortness of breath.   alum & mag hydroxide-simeth 200-200-20 MG/5ML suspension Commonly known as:  MAALOX/MYLANTA Take 30 mLs by mouth every 4 (four) hours as needed for indigestion.   ANORO ELLIPTA 62.5-25 MCG/INH Aepb Generic drug:  umeclidinium-vilanterol Inhale 1 puff into the lungs daily.   ASPERCREME LIDOCAINE 4 % Ptch Generic drug:  Lidocaine Apply 1 patch to the  Right Hip Daily (area of the greater trochanter) daily. Remove after 12 hours   aspirin EC 81 MG tablet Take 81 mg by mouth daily.   bisacodyl 10 MG suppository Commonly known as:  DULCOLAX Place 10 mg rectally daily as needed.   buPROPion 75 MG tablet Commonly known as:  WELLBUTRIN Take 75 mg by mouth daily.   DERMACLOUD Crea Apply liberal amount topically to area of skin irritation daily as needed. OK to leave at bedside.   docusate sodium 100 MG capsule Commonly known as:  COLACE Take 1 capsule (100 mg total) by mouth 2 (two) times daily.   donepezil 5 MG tablet Commonly known as:  ARICEPT Take 5 mg by mouth daily.   dronabinol 2.5 MG capsule Commonly known as:  MARINOL Take 2.5 mg by mouth 2 (two) times daily with a meal.   ENDIT EX Apply topically. Apply 1 application topically BID and PRN to peri-area/buttocks   feeding supplement (ENSURE ENLIVE) Liqd Take 237 mLs by mouth 2 (two) times daily. give with a meal for nutrition   feeding supplement (PRO-STAT SUGAR FREE 64) Liqd Take 30 mLs by mouth 2 (two) times daily between meals.   ferrous sulfate 325 (65 FE) MG tablet Take 1 tablet (325 mg total) by mouth 2 (two) times daily with a meal.   geriatric multivitamins-minerals Elix Take 15 mLs by mouth daily.   levalbuterol 1.25 MG/3ML nebulizer solution Commonly known as:  XOPENEX Take 1.25 mg by nebulization 2 (two) times daily.   levalbuterol 1.25 MG/3ML nebulizer solution Commonly known as:  XOPENEX Take 1.25 mg by nebulization every 6 (six) hours as needed for wheezing.   levothyroxine 75 MCG tablet Commonly known as:  SYNTHROID, LEVOTHROID Take 1 tablet (75 mcg total) by mouth daily before breakfast.   nitroGLYCERIN 0.4 MG SL tablet Commonly known as:  NITROSTAT Place 0.4 mg under the tongue every 5 (five) minutes as needed for chest pain.   OXYGEN Inhale 2 L into the lungs continuous.   pantoprazole 40 MG tablet Commonly known as:  PROTONIX Take  1 tablet (40 mg total) by mouth 2 (two) times daily.   sertraline 50 MG tablet Commonly known as:  ZOLOFT Take 75 mg by mouth daily. 1 and 1/2 tab   Vitamin D 2000 units Caps Take 1 capsule by mouth daily.       Review of Systems  Constitutional: Negative for activity change, appetite change, chills, diaphoresis and fever.  HENT: Negative for congestion, mouth sores, nosebleeds, postnasal drip, sneezing, sore throat, trouble swallowing and voice change.   Respiratory: Negative for apnea, cough, choking, chest tightness, shortness of breath and wheezing.   Cardiovascular: Negative for chest pain, palpitations and leg swelling.  Gastrointestinal: Negative for abdominal distention, abdominal pain, constipation, diarrhea and nausea.  Genitourinary: Negative for difficulty urinating, dysuria, frequency and urgency.  Musculoskeletal: Positive for arthralgias (typical arthritis). Negative for back pain, gait problem and myalgias.  Skin: Negative for color change, pallor, rash and wound.  Neurological: Positive for weakness. Negative for dizziness, tremors, syncope, speech  difficulty, numbness and headaches.  Psychiatric/Behavioral: Positive for confusion (at times). Negative for agitation and behavioral problems.  All other systems reviewed and are negative.   Immunization History  Administered Date(s) Administered  . Influenza Split 03/31/2014  . Influenza,inj,Quad PF,6+ Mos 05/20/2015  . Pneumococcal Polysaccharide-23 03/31/2014   Pertinent  Health Maintenance Due  Topic Date Due  . DEXA SCAN  05/25/1997  . PNA vac Low Risk Adult (2 of 2 - PCV13) 04/01/2015  . INFLUENZA VACCINE  01/04/2018   Fall Risk  08/01/2017  Falls in the past year? No   Functional Status Survey:    Vitals:   11/15/17 1214  BP: (!) 155/59  Pulse: 76  Resp: 20  Temp: 98.2 F (36.8 C)  TempSrc: Oral  SpO2: 98%  Weight: 97 lb 3.2 oz (44.1 kg)  Height: 5' (1.524 m)   Body mass index is 18.98  kg/m. Physical Exam  Constitutional: Vital signs are normal. She appears well-developed and well-nourished. She is active and cooperative. She does not appear ill. No distress.  HENT:  Head: Normocephalic and atraumatic.  Mouth/Throat: Uvula is midline, oropharynx is clear and moist and mucous membranes are normal. Mucous membranes are not pale, not dry and not cyanotic.  Eyes: Pupils are equal, round, and reactive to light. Conjunctivae, EOM and lids are normal.  Neck: Trachea normal, normal range of motion and full passive range of motion without pain. Neck supple. No JVD present. No tracheal deviation, no edema and no erythema present. No thyromegaly present.  Cardiovascular: Normal rate, normal heart sounds, intact distal pulses and normal pulses. An irregular rhythm present. Exam reveals no gallop, no distant heart sounds and no friction rub.  No murmur heard. Pulses:      Dorsalis pedis pulses are 2+ on the right side, and 2+ on the left side.  No edema  Pulmonary/Chest: Effort normal and breath sounds normal. No accessory muscle usage. No respiratory distress. She has no decreased breath sounds. She has no wheezes. She has no rhonchi. She has no rales. She exhibits no tenderness.  Abdominal: Soft. Normal appearance and bowel sounds are normal. She exhibits no distension and no ascites. There is no tenderness.  Musculoskeletal: Normal range of motion. She exhibits no edema or tenderness.  Expected osteoarthritis, stiffness; Bilateral Calves soft, supple. Negative Homan's Sign. B- pedal pulses equal; generalized weakness  Neurological: She is alert. She has normal strength.  Skin: Skin is warm, dry and intact. Bruising (scattered on arms) noted. She is not diaphoretic. No cyanosis. No pallor. Nails show no clubbing.  Psychiatric: She has a normal mood and affect. Her speech is normal and behavior is normal. Judgment and thought content normal. Cognition and memory are impaired. She exhibits  abnormal recent memory.  pleasant  Nursing note and vitals reviewed.   Labs reviewed: Recent Labs    07/13/17 1550  08/09/17 2230 08/17/17 1535 08/31/17 1208  NA  --    < > 136 139 136  K  --    < > 4.7 3.6 3.5  CL  --    < > 95* 101 96*  CO2  --    < > 28 27 25   GLUCOSE  --    < > 138* 169* 165*  BUN  --    < > 79* 43* 46*  CREATININE  --    < > 2.59* 1.69* 1.72*  CALCIUM  --    < > 8.9 9.1 9.3  MG 2.2  --   --  2.2  --    < > = values in this interval not displayed.   Recent Labs    08/09/17 2230 08/17/17 1535 08/31/17 1208  AST 25 33 30  ALT 30 27 21   ALKPHOS 89 107 161*  BILITOT 1.0 0.6 0.6  PROT 6.0* 6.2* 6.8  ALBUMIN 3.3* 3.4* 3.3*   Recent Labs    08/09/17 2230 08/17/17 1535 08/31/17 1208  WBC 7.1 5.9 8.2  NEUTROABS 4.4 4.0 5.4  HGB 10.1* 10.7* 10.7*  HCT 30.1* 31.8* 31.9*  MCV 90.3 91.9 90.2  PLT 167 222 323   Lab Results  Component Value Date   TSH 2.837 08/17/2017   No results found for: HGBA1C Lab Results  Component Value Date   CHOL 239 (H) 08/17/2017   HDL 54 08/17/2017   LDLCALC 155 (H) 08/17/2017   TRIG 151 (H) 08/17/2017   CHOLHDL 4.4 08/17/2017    Significant Diagnostic Results in last 30 days:  No results found.  Assessment/Plan  Dementia without behavioral disturbance, unspecified dementia type  Esophagitis, erosive  Large hiatal hernia   Above listed conditions stable  Continue current medication regimen  Monitor for worsening reflux/ pain  Monitor for s/s of dysphagia  Safety precautions  Fall precautions  Pt being followed by Palliative Care   Family/ staff Communication:   Total Time:  Documentation:  Face to Face:  Family/Phone:   Labs/tests ordered: not due, recent labs reviewed- stable   Medication list reviewed and assessed for continued appropriateness. Monthly medication orders reviewed and signed.  Vikki Ports, NP-C Geriatric[facility] [phone], [place] [address] Cell Phone (Mon-Fri 8am-5pm):  [phone] On Call:  [phone] & follow prompts after 5pm & weekends Office Phone:  [phone] Office Fax:  [phone]

## 2017-12-01 NOTE — Assessment & Plan Note (Signed)
Stable. No recent c/o reflux or dysphagia.

## 2017-12-01 NOTE — Assessment & Plan Note (Signed)
Stable. No recent c/o exacerbation of symptoms. Not currently on tx.

## 2017-12-01 NOTE — Assessment & Plan Note (Signed)
Stable. No negative behaviors noted. Pleasant. On Aricept 5 mg po Q HS

## 2017-12-04 ENCOUNTER — Encounter
Admission: RE | Admit: 2017-12-04 | Discharge: 2017-12-04 | Disposition: A | Discharge status: Home / Self Care | Payer: Medicare HMO | Source: Ambulatory Visit | Attending: Internal Medicine | Admitting: Internal Medicine

## 2017-12-08 ENCOUNTER — Encounter: Payer: Self-pay | Admitting: Adult Health

## 2017-12-08 ENCOUNTER — Non-Acute Institutional Stay (SKILLED_NURSING_FACILITY): Payer: Medicare HMO | Admitting: Adult Health

## 2017-12-08 DIAGNOSIS — F0150 Vascular dementia without behavioral disturbance: Secondary | ICD-10-CM | POA: Diagnosis not present

## 2017-12-08 DIAGNOSIS — J449 Chronic obstructive pulmonary disease, unspecified: Secondary | ICD-10-CM

## 2017-12-08 DIAGNOSIS — E034 Atrophy of thyroid (acquired): Secondary | ICD-10-CM | POA: Diagnosis not present

## 2017-12-08 DIAGNOSIS — R627 Adult failure to thrive: Secondary | ICD-10-CM

## 2017-12-08 DIAGNOSIS — K449 Diaphragmatic hernia without obstruction or gangrene: Secondary | ICD-10-CM | POA: Diagnosis not present

## 2017-12-08 DIAGNOSIS — I480 Paroxysmal atrial fibrillation: Secondary | ICD-10-CM | POA: Diagnosis not present

## 2017-12-08 DIAGNOSIS — D500 Iron deficiency anemia secondary to blood loss (chronic): Secondary | ICD-10-CM

## 2017-12-08 DIAGNOSIS — M25551 Pain in right hip: Secondary | ICD-10-CM | POA: Diagnosis not present

## 2017-12-08 DIAGNOSIS — K5909 Other constipation: Secondary | ICD-10-CM

## 2017-12-08 DIAGNOSIS — F418 Other specified anxiety disorders: Secondary | ICD-10-CM | POA: Diagnosis not present

## 2017-12-08 DIAGNOSIS — I5032 Chronic diastolic (congestive) heart failure: Secondary | ICD-10-CM

## 2017-12-08 DIAGNOSIS — G8929 Other chronic pain: Secondary | ICD-10-CM

## 2017-12-08 DIAGNOSIS — K2210 Ulcer of esophagus without bleeding: Secondary | ICD-10-CM

## 2017-12-08 NOTE — Progress Notes (Signed)
Location:   [facility] Room Number: [phone] Place of Service:  SNF (31)   CODE STATUS: FULL  Allergies  Allergen Reactions  . Hydrocodone Other (See Comments)  . Oxycodone Hives    Chief Complaint  Patient presents with  . Medical Management of Chronic Issues    Afib; heart failure copd     HPI:  She is a 84 year old long term resident of this facility being seen for the management of her chronic illnesses: afib; heart failure copd. She has been declining her routine xopenex treatment stating that she does not need it. She is denying any cough; no palpitations; no swelling of feet. There are no nursing concerns at this time.   Past Medical History:  Diagnosis Date  . Anxiety    unspecified  . Atrial fibrillation ([place])   . COPD (chronic obstructive pulmonary disease) ([place]) 10/05/2013  . Coronary artery disease 10/05/2013  . Dementia   . Depression   . Diastolic heart failure ([place])   . Hyperlipidemia, unspecified   . Hypertension 10/05/2013  . Hypothyroidism   . Hypothyroidism, acquired    unspecified  . PVD (peripheral vascular disease) ([place]) 10/05/2013   iliac stents    Past Surgical History:  Procedure Laterality Date  . ABDOMINAL HYSTERECTOMY    . CARDIAC SURGERY    . CARDIOVERSION    . CATARACT EXTRACTION Bilateral 2012  . CORONARY ARTERY BYPASS GRAFT  1985   x3  . ESOPHAGOGASTRODUODENOSCOPY N/A 07/09/2017   Procedure: ESOPHAGOGASTRODUODENOSCOPY (EGD);  Surgeon: Lin Landsman, MD;  Location: [facility] ENDOSCOPY;  Service: Gastroenterology;  Laterality: N/A;  . [place]   patient unsure of which side  . HIP PINNING,CANNULATED Right 05/19/2015   Procedure: CANNULATED HIP PINNING;  Surgeon: Hessie Knows, MD;  Location: [facility] ORS;  Service: Orthopedics;  Laterality: Right;    Social History   Socioeconomic History  . Marital status: Divorced    Spouse name: Not on file  . Number of children: Not on file  . Years  of education: Not on file  . Highest education level: Not on file  Occupational History  . Occupation: retired  Scientific laboratory technician  . Financial resource strain: Not hard at all  . Food insecurity:    Worry: Never true    Inability: Never true  . Transportation needs:    Medical: No    Non-medical: No  Tobacco Use  . Smoking status: Former Smoker    Packs/day: 0.25    Years: 30.00    Pack years: 7.50    Types: Cigarettes    Last attempt to quit: 07/01/2017    Years since quitting: 0.4  . Smokeless tobacco: Never Used  Substance and Sexual Activity  . Alcohol use: Yes    Comment: rare, glass of wine occasionally  . Drug use: No  . Sexual activity: Never  Lifestyle  . Physical activity:    Days per week: 5 days    Minutes per session: 30 min  . Stress: Not at all  Relationships  . Social connections:    Talks on phone: Not on file    Gets together: Not on file    Attends religious service: Not on file    Active member of club or organization: Not on file    Attends meetings of clubs or organizations: Not on file    Relationship status: Not on file  . Intimate partner violence:    Fear of current  or ex partner: Not on file    Emotionally abused: Not on file    Physically abused: Not on file    Forced sexual activity: Not on file  Other Topics Concern  . Not on file  Social History Narrative  . Not on file   Family History  Problem Relation Age of Onset  . Stroke Mother   . Cancer Sister   . Parkinsonism Brother       VITAL SIGNS BP (!) 158/72   Pulse 84   Temp 98.1 F (36.7 C) (Oral)   Resp 20   Ht 5' (1.524 m)   Wt 98 lb 14.4 oz (44.9 kg)   SpO2 98%   BMI 19.32 kg/m   Outpatient Encounter Medications as of 12/08/2017  Medication Sig  . acetaminophen (TYLENOL) 325 MG tablet Take 650 mg by mouth every 4 (four) hours as needed. for pain/ increased temp. May be administered orally, per G-tube if needed or rectally if unable to swallow (separate order). Maximum  dose for 24 hours is 3,000 mg from all sources of Acetaminophen/ Tylenol  . albuterol (PROVENTIL HFA;VENTOLIN HFA) 108 (90 BASE) MCG/ACT inhaler Inhale 2 puffs into the lungs every 6 (six) hours as needed for wheezing or shortness of breath.  Marland Kitchen alum & mag hydroxide-simeth (MAALOX/MYLANTA) 200-200-20 MG/5ML suspension Take 30 mLs by mouth every 4 (four) hours as needed for indigestion.  . Amino Acids-Protein Hydrolys (FEEDING SUPPLEMENT, PRO-STAT SUGAR FREE 64,) LIQD Take 30 mLs by mouth 2 (two) times daily between meals.  Marland Kitchen aspirin EC 81 MG tablet Take 81 mg by mouth daily.   . bisacodyl (DULCOLAX) 10 MG suppository Place 10 mg rectally daily as needed.  Marland Kitchen buPROPion (WELLBUTRIN) 75 MG tablet Take 75 mg by mouth daily.   . Cholecalciferol (VITAMIN D) 2000 units CAPS Take 1 capsule by mouth daily.  Marland Kitchen docusate sodium (COLACE) 100 MG capsule Take 1 capsule (100 mg total) by mouth 2 (two) times daily.  Marland Kitchen donepezil (ARICEPT) 5 MG tablet Take 5 mg by mouth daily.   Marland Kitchen dronabinol (MARINOL) 2.5 MG capsule Take 2.5 mg by mouth 2 (two) times daily with a meal.  . feeding supplement, ENSURE ENLIVE, (ENSURE ENLIVE) LIQD Take 237 mLs by mouth 2 (two) times daily. give with a meal for nutrition  . ferrous sulfate 325 (65 FE) MG tablet Take 1 tablet (325 mg total) by mouth 2 (two) times daily with a meal.  . geriatric multivitamins-minerals (ELDERTONIC/GEVRABON) ELIX Take 15 mLs by mouth daily.   . Infant Care Products (DERMACLOUD) CREA Apply liberal amount topically to area of skin irritation daily as needed. OK to leave at bedside.  . levalbuterol (XOPENEX) 1.25 MG/3ML nebulizer solution Take 1.25 mg by nebulization 2 (two) times daily.  Marland Kitchen levalbuterol (XOPENEX) 1.25 MG/3ML nebulizer solution Take 1.25 mg by nebulization every 6 (six) hours as needed for wheezing.  Marland Kitchen levothyroxine (SYNTHROID, LEVOTHROID) 75 MCG tablet Take 1 tablet (75 mcg total) by mouth daily before breakfast.  . Lidocaine (ASPERCREME  LIDOCAINE) 4 % PTCH Apply 1 patch to the Right Hip Daily (area of the greater trochanter) daily. Remove after 12 hours  . nitroGLYCERIN (NITROSTAT) 0.4 MG SL tablet Place 0.4 mg under the tongue every 5 (five) minutes as needed for chest pain.  . OXYGEN Inhale 2 L into the lungs continuous.  . pantoprazole (PROTONIX) 40 MG tablet Take 1 tablet (40 mg total) by mouth 2 (two) times daily.  . sertraline (ZOLOFT) 50 MG tablet  Take 75 mg by mouth daily. 1 and 1/2 tab  . Skin Protectants, Misc. (ENDIT EX) Apply topically. Apply 1 application topically BID and PRN to peri-area/buttocks  . Umeclidinium-Vilanterol (ANORO ELLIPTA) 62.5-25 MCG/INH AEPB Inhale 1 puff into the lungs daily.    No facility-administered encounter medications on file as of 12/08/2017.      SIGNIFICANT DIAGNOSTIC EXAMS  LABS REVIEWED:   08-17-17: chol 239; ldl 155; trig 151; hdl 54; tsh 2.837; mag 2.2; vit D 25.4 08-31-17: wbc 8.2; hgb 10.7; hct 31.9; mcv 90.2; plt 323 glucose 165; bun 46; creat 1.72; k+ 3.5 na++136 ca 9.3 liver normal albumin 3.3    Review of Systems  Constitutional: Negative for malaise/fatigue.  Respiratory: Negative for shortness of breath.   Cardiovascular: Negative for chest pain and leg swelling.  Gastrointestinal: Negative for abdominal pain, constipation and heartburn.  Musculoskeletal: Negative for back pain, joint pain and myalgias.  Skin: Negative.   Neurological: Negative for dizziness.  Psychiatric/Behavioral: The patient is not nervous/anxious.     Physical Exam  Constitutional: She appears well-developed and well-nourished. No distress.  Frail   Neck: No thyromegaly present.  Cardiovascular: Normal rate, normal heart sounds and intact distal pulses.  Heart rate irregular   Pulmonary/Chest: Effort normal and breath sounds normal. No respiratory distress.  Abdominal: Soft. Bowel sounds are normal. She exhibits no distension. There is no tenderness.  Musculoskeletal: Normal range of  motion. She exhibits no edema.  Lymphadenopathy:    She has no cervical adenopathy.  Neurological: She is alert.  Skin: Skin is warm and dry. She is not diaphoretic.  Bruising to both arms R>L  Psychiatric: She has a normal mood and affect.       ASSESSMENT/ PLAN:  TODAY:   1. Paroxymal atrial fibrillation: heart rate is controlled; will continue asa 81 mg daily has prn ntg for chest pain.   2. Chronic diastolic heart failure: is stable will continue asa 81 mg daily will continue to monitor  3. COPD: is stable is 02 dependent; will continue albuterol 2 puffs every 6 hours as needed; xopenex 1.25 neb treated every 6 hours as needed; and anoro 62.5/25 mcg 1 puff daily   4.  CAD is status post CABG X3: is stable will continue asa 81 mg daily has prn ntg  5. Large hiatal hernia/esophagitis, erosive: is stable will continue protonix 40 mg twice daily   6, hypothyroidism due to acquired atrophy of thyroid: stable tsh 2.837; will continue synthroid 75 mcg daily  7. Iron deficiency anemia due to chronic blood loss: stable hgb 10.7; will continue iron twice daily   8. Chronic constipation: is stable will continue colace twice daily   9. Depression with anxiety: is stable will continue wellbutrin 75 mg daily and zoloft 75 mg daily   10. Vascular dementia: no change in status; weight is 98 pounds; will continue aricept 5 mg nightly   11. Chronic right hip pain: is stable will continue lidoderm 4% patch to right hip daily   12. Failure to thrive: is without change weight is 98 pounds; albumin is 3.3 will continue marinol 2.5 mg twice daily and will monitor status.    Will stop routine xopenex and will keep prn dosing.      MD is aware of resident's narcotic use and is in agreement with current plan of care. We will attempt to wean resident as apropriate   Ok Edwards NP [facility]  Contact [phone] Monday through Friday 8am- 5pm  After hours call [phone]

## 2017-12-11 DIAGNOSIS — R627 Adult failure to thrive: Secondary | ICD-10-CM | POA: Insufficient documentation

## 2017-12-11 DIAGNOSIS — G8929 Other chronic pain: Secondary | ICD-10-CM | POA: Insufficient documentation

## 2017-12-11 DIAGNOSIS — F0150 Vascular dementia without behavioral disturbance: Secondary | ICD-10-CM | POA: Insufficient documentation

## 2017-12-11 DIAGNOSIS — M25551 Pain in right hip: Secondary | ICD-10-CM

## 2017-12-11 DIAGNOSIS — K5909 Other constipation: Secondary | ICD-10-CM | POA: Insufficient documentation

## 2017-12-11 DIAGNOSIS — F418 Other specified anxiety disorders: Secondary | ICD-10-CM | POA: Insufficient documentation

## 2017-12-11 DIAGNOSIS — E034 Atrophy of thyroid (acquired): Secondary | ICD-10-CM | POA: Insufficient documentation

## 2017-12-23 DIAGNOSIS — J440 Chronic obstructive pulmonary disease with acute lower respiratory infection: Secondary | ICD-10-CM | POA: Diagnosis not present

## 2017-12-23 DIAGNOSIS — Z0000 Encounter for general adult medical examination without abnormal findings: Secondary | ICD-10-CM | POA: Diagnosis not present

## 2017-12-23 DIAGNOSIS — I25810 Atherosclerosis of coronary artery bypass graft(s) without angina pectoris: Secondary | ICD-10-CM | POA: Diagnosis not present

## 2017-12-23 DIAGNOSIS — I7300 Raynaud's syndrome without gangrene: Secondary | ICD-10-CM | POA: Diagnosis not present

## 2017-12-23 DIAGNOSIS — R54 Age-related physical debility: Secondary | ICD-10-CM | POA: Diagnosis not present

## 2017-12-23 DIAGNOSIS — F0391 Unspecified dementia with behavioral disturbance: Secondary | ICD-10-CM | POA: Diagnosis not present

## 2017-12-23 DIAGNOSIS — I480 Paroxysmal atrial fibrillation: Secondary | ICD-10-CM | POA: Diagnosis not present

## 2017-12-23 DIAGNOSIS — E440 Moderate protein-calorie malnutrition: Secondary | ICD-10-CM | POA: Diagnosis not present

## 2018-01-04 ENCOUNTER — Encounter
Admission: RE | Admit: 2018-01-04 | Discharge: 2018-01-04 | Disposition: A | Discharge status: Home / Self Care | Payer: Medicare HMO | Source: Ambulatory Visit | Attending: Internal Medicine | Admitting: Internal Medicine

## 2018-01-08 ENCOUNTER — Encounter: Payer: Self-pay | Admitting: Adult Health

## 2018-01-08 ENCOUNTER — Non-Acute Institutional Stay (SKILLED_NURSING_FACILITY): Payer: Medicare HMO | Admitting: Adult Health

## 2018-01-08 DIAGNOSIS — I5032 Chronic diastolic (congestive) heart failure: Secondary | ICD-10-CM | POA: Diagnosis not present

## 2018-01-08 DIAGNOSIS — I480 Paroxysmal atrial fibrillation: Secondary | ICD-10-CM

## 2018-01-08 DIAGNOSIS — I25709 Atherosclerosis of coronary artery bypass graft(s), unspecified, with unspecified angina pectoris: Secondary | ICD-10-CM | POA: Diagnosis not present

## 2018-01-08 DIAGNOSIS — J449 Chronic obstructive pulmonary disease, unspecified: Secondary | ICD-10-CM | POA: Diagnosis not present

## 2018-01-08 NOTE — Progress Notes (Signed)
Provider:Michael Ventresca Nyoka Cowden, NP Location  [facility]  PCP: Kirk Ruths, MD   Extended Emergency Contact Information Primary Emergency Contact: Drema Balzarine Address: [address]          [place], [place] [address] Home Phone: [phone] Mobile Phone: [phone] Relation: Daughter Secondary Emergency Contact: [place] Phone: [phone] Mobile Phone: [phone] Relation: Daughter  Codes status: FULL Goals of care: advanced directive information Advanced Directives 01/08/2018  Does Patient Have a Medical Advance Directive? Yes  Type of Advance Directive Living will;Healthcare Power of Attorney  Does patient want to make changes to medical advance directive? No - Patient declined  Copy of [place] in Chart? Yes  Would patient like information on creating a medical advance directive? -     Allergies  Allergen Reactions  . Hydrocodone Other (See Comments)  . Oxycodone Hives    Chief Complaint  Patient presents with  . Annual Exam    Annual Exam    HPI  She is a 84 year old long term resident of this facility being seen for her annual evaluation. She has not hospitalized since her admission to this facility. She has not had any acute illnesses. She denies any chest pain; no cough or shortness of breath. There are no nursing concerns at this time. She will continue to be followed for her chronic illnesses including: afib; chf; copd; cad.   Past Medical History:  Diagnosis Date  . Anxiety    unspecified  . Atrial fibrillation ([place])   . COPD (chronic obstructive pulmonary disease) ([place]) 10/05/2013  . Coronary artery disease 10/05/2013  . Dementia   . Depression   . Diastolic heart failure ([place])   . Hyperlipidemia, unspecified   . Hypertension 10/05/2013  . Hypothyroidism   . Hypothyroidism, acquired    unspecified  . PVD (peripheral vascular disease) ([place]) 10/05/2013   iliac stents   Past Surgical History:   Procedure Laterality Date  . ABDOMINAL HYSTERECTOMY    . CARDIAC SURGERY    . CARDIOVERSION    . CATARACT EXTRACTION Bilateral 2012  . CORONARY ARTERY BYPASS GRAFT  1985   x3  . ESOPHAGOGASTRODUODENOSCOPY N/A 07/09/2017   Procedure: ESOPHAGOGASTRODUODENOSCOPY (EGD);  Surgeon: Lin Landsman, MD;  Location: [facility] ENDOSCOPY;  Service: Gastroenterology;  Laterality: N/A;  . [place]   patient unsure of which side  . HIP PINNING,CANNULATED Right 05/19/2015   Procedure: CANNULATED HIP PINNING;  Surgeon: Hessie Knows, MD;  Location: [facility] ORS;  Service: Orthopedics;  Laterality: Right;    reports that she quit smoking about 6 months ago. Her smoking use included cigarettes. She has a 7.50 pack-year smoking history. She has never used smokeless tobacco. She reports that she drinks alcohol. She reports that she does not use drugs. Social History   Tobacco Use  . Smoking status: Former Smoker    Packs/day: 0.25    Years: 30.00    Pack years: 7.50    Types: Cigarettes    Last attempt to quit: 07/01/2017    Years since quitting: 0.5  . Smokeless tobacco: Never Used  Substance Use Topics  . Alcohol use: Yes    Comment: rare, glass of wine occasionally  . Drug use: No   Family History  Problem Relation Age of Onset  . Stroke Mother   . Cancer Sister   . Parkinsonism Brother     Pertinent  Health Maintenance Due  Topic Date Due  . DEXA SCAN  05/25/1997  . PNA vac Low Risk Adult (2 of 2 - PCV13) 04/01/2015  . INFLUENZA VACCINE  01/04/2018   Fall Risk  08/01/2017  Falls in the past year? No   Depression screen PHQ 2/9 08/01/2017  Decreased Interest 0  Down, Depressed, Hopeless 0  PHQ - 2 Score 0    Functional Status Survey:    Outpatient Encounter Medications as of 01/08/2018  Medication Sig  . acetaminophen (TYLENOL) 325 MG tablet Take 650 mg by mouth every 4 (four) hours as needed. for pain/ increased temp. May be administered orally, per G-tube if needed  or rectally if unable to swallow (separate order). Maximum dose for 24 hours is 3,000 mg from all sources of Acetaminophen/ Tylenol  . albuterol (PROVENTIL HFA;VENTOLIN HFA) 108 (90 BASE) MCG/ACT inhaler Inhale 2 puffs into the lungs every 6 (six) hours as needed for wheezing or shortness of breath.  Marland Kitchen alum & mag hydroxide-simeth (MAALOX/MYLANTA) 200-200-20 MG/5ML suspension Take 30 mLs by mouth every 4 (four) hours as needed for indigestion.  . Amino Acids-Protein Hydrolys (FEEDING SUPPLEMENT, PRO-STAT SUGAR FREE 64,) LIQD Take 30 mLs by mouth 2 (two) times daily between meals.  Marland Kitchen aspirin EC 81 MG tablet Take 81 mg by mouth daily.   . bisacodyl (DULCOLAX) 10 MG suppository Place 10 mg rectally daily as needed.  Marland Kitchen buPROPion (WELLBUTRIN) 75 MG tablet Take 75 mg by mouth daily.   . Cholecalciferol (VITAMIN D) 2000 units CAPS Take 1 capsule by mouth daily.  Marland Kitchen docusate sodium (COLACE) 100 MG capsule Take 1 capsule (100 mg total) by mouth 2 (two) times daily.  Marland Kitchen donepezil (ARICEPT) 5 MG tablet Take 5 mg by mouth daily.   Marland Kitchen dronabinol (MARINOL) 2.5 MG capsule Take 2.5 mg by mouth 2 (two) times daily with a meal.  . feeding supplement, ENSURE ENLIVE, (ENSURE ENLIVE) LIQD Take 237 mLs by mouth 2 (two) times daily. give with a meal for nutrition  . ferrous sulfate 325 (65 FE) MG tablet Take 1 tablet (325 mg total) by mouth 2 (two) times daily with a meal.  . geriatric multivitamins-minerals (ELDERTONIC/GEVRABON) ELIX Take 15 mLs by mouth daily.   . Infant Care Products (DERMACLOUD) CREA Apply liberal amount topically to area of skin irritation daily as needed. OK to leave at bedside.  . levalbuterol (XOPENEX) 1.25 MG/3ML nebulizer solution Take 1.25 mg by nebulization every 6 (six) hours as needed for wheezing.  Marland Kitchen levothyroxine (SYNTHROID, LEVOTHROID) 75 MCG tablet Take 1 tablet (75 mcg total) by mouth daily before breakfast.  . nitroGLYCERIN (NITROSTAT) 0.4 MG SL tablet Place 0.4 mg under the tongue every 5  (five) minutes as needed for chest pain.  . Nutritional Supplements (NUTRITIONAL SUPPLEMENT PO) Diet Type: Heart Healthy- NO added salt  . OXYGEN Inhale 2 L into the lungs continuous.  . pantoprazole (PROTONIX) 40 MG tablet Take 1 tablet (40 mg total) by mouth 2 (two) times daily.  . sertraline (ZOLOFT) 50 MG tablet Take 75 mg by mouth daily. 1 and 1/2 tab  . Skin Protectants, Misc. (ENDIT EX) Apply topically. Apply 1 application topically BID and PRN to peri-area/buttocks  . Umeclidinium-Vilanterol (ANORO ELLIPTA) 62.5-25 MCG/INH AEPB Inhale 1 puff into the lungs daily.   . [DISCONTINUED] levalbuterol (XOPENEX) 1.25 MG/3ML nebulizer solution Take 1.25 mg by nebulization 2 (two) times daily.  . [DISCONTINUED] Lidocaine (ASPERCREME LIDOCAINE) 4 % PTCH Apply 1 patch to the Right Hip Daily (area of the greater trochanter) daily. Remove after 12 hours  No facility-administered encounter medications on file as of 01/08/2018.      Vitals:   01/08/18 1356  BP: 102/66  Pulse: 86  Resp: 20  Temp: 98.1 F (36.7 C)  TempSrc: Oral  SpO2: 100%  Weight: 98 lb (44.5 kg)  Height: 5' (1.524 m)   Body mass index is 19.14 kg/m.   SIGNIFICANT DIAGNOSTIC EXAMS  LABS REVIEWED:   08-17-17: chol 239; ldl 155; trig 151; hdl 54; tsh 2.837; mag 2.2; vit D 25.4 08-31-17: wbc 8.2; hgb 10.7; hct 31.9; mcv 90.2; plt 323 glucose 165; bun 46; creat 1.72; k+ 3.5 na++136 ca 9.3 liver normal albumin 3.3  NO NEW LABS.     Review of Systems  Constitutional: Negative for malaise/fatigue.  Respiratory: Negative for cough and shortness of breath.   Cardiovascular: Negative for chest pain, palpitations and leg swelling.  Gastrointestinal: Negative for abdominal pain, constipation and heartburn.  Musculoskeletal: Negative for back pain, joint pain and myalgias.  Skin: Negative.   Neurological: Negative for dizziness.  Psychiatric/Behavioral: The patient is not nervous/anxious.     Physical Exam    Constitutional: She is oriented to person, place, and time. No distress.  Frail   Neck: No thyromegaly present.  Cardiovascular: Normal rate, normal heart sounds and intact distal pulses.  Heart rate irregular   Pulmonary/Chest: Effort normal and breath sounds normal. No respiratory distress.  Abdominal: Soft. Bowel sounds are normal. She exhibits no distension. There is no tenderness.  Musculoskeletal: Normal range of motion. She exhibits no edema.  Lymphadenopathy:    She has no cervical adenopathy.  Neurological: She is alert and oriented to person, place, and time.  Skin: Skin is warm and dry. She is not diaphoretic.  Psychiatric: She has a normal mood and affect.    ASSESSMENT/PLAN  TODAY:   1. Paroxymal atrial fibrillation: heart rate is controlled; will continue asa 81 mg daily has prn ntg for chest pain.   2. Chronic diastolic heart failure: is stable will continue asa 81 mg daily will continue to monitor  3. COPD: is stable is 02 dependent; will continue albuterol 2 puffs every 6 hours as needed; xopenex 1.25 neb treatment  every 6 hours as needed; and anoro 62.5/25 mcg 1 puff daily   4.  CAD is status post CABG X3: is stable will continue asa 81 mg daily has prn ntg  PREVIOUS   5. Large hiatal hernia/esophagitis, erosive: is stable will continue protonix 40 mg twice daily   6, hypothyroidism due to acquired atrophy of thyroid: stable tsh 2.837; will continue synthroid 75 mcg daily  7. Iron deficiency anemia due to chronic blood loss: stable hgb 10.7; will continue iron twice daily   8. Chronic constipation: is stable will continue colace twice daily   9. Depression with anxiety: is stable will continue wellbutrin 75 mg daily and zoloft 75 mg daily   10. Vascular dementia: no change in status; weight is 98 pounds; will continue aricept 5 mg nightly   11. Chronic right hip pain: is stable will continue lidoderm 4% patch to right hip daily   12. Failure to thrive:  is without change weight is 98 pounds; albumin is 3.3 will continue marinol 2.5 mg twice daily and will monitor status.    Is due for pneumovax DEXA scan   Ok Edwards NP [facility]  Contact [phone] Monday through Friday 8am- 5pm  After hours call [phone]

## 2018-01-14 DIAGNOSIS — I25810 Atherosclerosis of coronary artery bypass graft(s) without angina pectoris: Secondary | ICD-10-CM | POA: Insufficient documentation

## 2018-02-01 DIAGNOSIS — I480 Paroxysmal atrial fibrillation: Secondary | ICD-10-CM | POA: Diagnosis not present

## 2018-02-01 DIAGNOSIS — E782 Mixed hyperlipidemia: Secondary | ICD-10-CM | POA: Diagnosis not present

## 2018-02-01 DIAGNOSIS — I739 Peripheral vascular disease, unspecified: Secondary | ICD-10-CM | POA: Diagnosis not present

## 2018-02-01 DIAGNOSIS — I25810 Atherosclerosis of coronary artery bypass graft(s) without angina pectoris: Secondary | ICD-10-CM | POA: Diagnosis not present

## 2018-02-01 DIAGNOSIS — I10 Essential (primary) hypertension: Secondary | ICD-10-CM | POA: Diagnosis not present

## 2018-02-04 ENCOUNTER — Encounter
Admission: RE | Admit: 2018-02-04 | Discharge: 2018-02-04 | Disposition: A | Discharge status: Home / Self Care | Payer: Medicare HMO | Source: Ambulatory Visit | Attending: Internal Medicine | Admitting: Internal Medicine

## 2018-02-08 ENCOUNTER — Encounter: Payer: Self-pay | Admitting: Adult Health

## 2018-02-08 ENCOUNTER — Non-Acute Institutional Stay (SKILLED_NURSING_FACILITY): Payer: Medicare HMO | Admitting: Adult Health

## 2018-02-08 DIAGNOSIS — J449 Chronic obstructive pulmonary disease, unspecified: Secondary | ICD-10-CM | POA: Diagnosis not present

## 2018-02-08 DIAGNOSIS — F0150 Vascular dementia without behavioral disturbance: Secondary | ICD-10-CM | POA: Diagnosis not present

## 2018-02-08 DIAGNOSIS — D500 Iron deficiency anemia secondary to blood loss (chronic): Secondary | ICD-10-CM | POA: Diagnosis not present

## 2018-02-08 DIAGNOSIS — R630 Anorexia: Secondary | ICD-10-CM | POA: Diagnosis not present

## 2018-02-08 DIAGNOSIS — E034 Atrophy of thyroid (acquired): Secondary | ICD-10-CM

## 2018-02-08 NOTE — Progress Notes (Signed)
Location:  [facility] Number: [id number] Place of Service:  SNF [phone]) Provider:  Durenda Age, NP  Patient Care Team: Kirk Ruths, MD as PCP - General (Internal Medicine)  Extended Emergency Contact Information Primary Emergency Contact: Drema Balzarine Address: [address]          [place], [place] [address] Home Phone: [phone] Mobile Phone: [phone] Relation: Daughter Secondary Emergency Contact: [place] Phone: [phone] Mobile Phone: [phone] Relation: Daughter  Code Status:  FULL CODE  Goals of care: Advanced Directive information Advanced Directives 02/08/2018  Does Patient Have a Medical Advance Directive? Yes  Type of Paramedic of [place];Living will  Does patient want to make changes to medical advance directive? No - Patient declined  Copy of [place] in Chart? Yes  Would patient like information on creating a medical advance directive? -     Chief Complaint  Patient presents with  . Medical Management of Chronic Issues    Routine Visit    HPI:  Pt is an 84 y.o. female seen today for medical management of chronic diseases.  She is a long-term care resident of [facility].  She has a PMH of atrial fibrillation, CAD, dementia, hypothyroidism, and hypertension. No reported SOB. Latest hgb 10.7 and continues to take Iron supplementation.      Past Medical History:  Diagnosis Date  . Anxiety    unspecified  . Atrial fibrillation ([place])   . COPD (chronic obstructive pulmonary disease) ([place]) 10/05/2013  . Coronary artery disease 10/05/2013  . Dementia   . Depression   . Diastolic heart failure ([place])   . Hyperlipidemia, unspecified   . Hypertension 10/05/2013  . Hypothyroidism   . Hypothyroidism, acquired    unspecified  . PVD (peripheral vascular disease) ([place]) 10/05/2013   iliac stents   Past Surgical History:  Procedure Laterality Date  .  ABDOMINAL HYSTERECTOMY    . CARDIAC SURGERY    . CARDIOVERSION    . CATARACT EXTRACTION Bilateral 2012  . CORONARY ARTERY BYPASS GRAFT  1985   x3  . ESOPHAGOGASTRODUODENOSCOPY N/A 07/09/2017   Procedure: ESOPHAGOGASTRODUODENOSCOPY (EGD);  Surgeon: Lin Landsman, MD;  Location[facility] ENDOSCOPY;  Service: Gastroenterology;  Laterality: N/A;  . [place]   patient unsure of which side  . HIP PINNING,CANNULATED Right 05/19/2015   Procedure: CANNULATED HIP PINNING;  Surgeon: Hessie Knows, MD;  Location: [facility] ORS;  Service: Orthopedics;  Laterality: Right;    Allergies  Allergen Reactions  . Hydrocodone Other (See Comments)  . Oxycodone Hives    Outpatient Encounter Medications as of 02/08/2018  Medication Sig  . acetaminophen (TYLENOL) 325 MG tablet Take 650 mg by mouth every 4 (four) hours as needed. for pain/ increased temp. May be administered orally, per G-tube if needed or rectally if unable to swallow (separate order). Maximum dose for 24 hours is 3,000 mg from all sources of Acetaminophen/ Tylenol  . albuterol (PROVENTIL HFA;VENTOLIN HFA) 108 (90 BASE) MCG/ACT inhaler Inhale 2 puffs into the lungs every 6 (six) hours as needed for wheezing or shortness of breath.  Marland Kitchen alum & mag hydroxide-simeth (MAALOX/MYLANTA) 200-200-20 MG/5ML suspension Take 30 mLs by mouth every 4 (four) hours as needed for indigestion.  . Amino Acids-Protein Hydrolys (FEEDING SUPPLEMENT, PRO-STAT SUGAR FREE 64,) LIQD Take 30 mLs by mouth 2 (two) times daily between meals.  Marland Kitchen aspirin EC 81 MG tablet Take 81 mg by mouth daily.   . bisacodyl (  DULCOLAX) 10 MG suppository Place 10 mg rectally daily as needed.  Marland Kitchen buPROPion (WELLBUTRIN) 75 MG tablet Take 75 mg by mouth daily.   . Cholecalciferol (VITAMIN D) 2000 units CAPS Take 1 capsule by mouth daily.  Marland Kitchen docusate sodium (COLACE) 100 MG capsule Take 1 capsule (100 mg total) by mouth 2 (two) times daily.  Marland Kitchen donepezil (ARICEPT) 5 MG tablet Take 5 mg by  mouth daily.   Marland Kitchen dronabinol (MARINOL) 2.5 MG capsule Take 2.5 mg by mouth 2 (two) times daily with a meal.  . feeding supplement, ENSURE ENLIVE, (ENSURE ENLIVE) LIQD Take 237 mLs by mouth 2 (two) times daily. give with a meal for nutrition  . ferrous sulfate 325 (65 FE) MG tablet Take 1 tablet (325 mg total) by mouth 2 (two) times daily with a meal.  . geriatric multivitamins-minerals (ELDERTONIC/GEVRABON) ELIX Take 15 mLs by mouth daily.   . Infant Care Products (DERMACLOUD) CREA Apply liberal amount topically to area of skin irritation daily as needed. OK to leave at bedside.  . levalbuterol (XOPENEX) 1.25 MG/3ML nebulizer solution Take 1.25 mg by nebulization every 6 (six) hours as needed for wheezing.  Marland Kitchen levothyroxine (SYNTHROID, LEVOTHROID) 75 MCG tablet Take 1 tablet (75 mcg total) by mouth daily before breakfast.  . nitroGLYCERIN (NITROSTAT) 0.4 MG SL tablet Place 0.4 mg under the tongue every 5 (five) minutes as needed for chest pain.  . Nutritional Supplements (NUTRITIONAL SUPPLEMENT PO) Diet Type: Heart Healthy- NO added salt  . OXYGEN Inhale 2 L into the lungs continuous as needed.   . pantoprazole (PROTONIX) 40 MG tablet Take 1 tablet (40 mg total) by mouth 2 (two) times daily.  . sertraline (ZOLOFT) 50 MG tablet Take 75 mg by mouth daily. 1 and 1/2 tab  . Skin Protectants, Misc. (ENDIT EX) Apply topically. Apply 1 application topically BID and PRN to peri-area/buttocks  . Umeclidinium-Vilanterol (ANORO ELLIPTA) 62.5-25 MCG/INH AEPB Inhale 1 puff into the lungs daily.    No facility-administered encounter medications on file as of 02/08/2018.     Review of Systems  GENERAL: No change in appetite, no fatigue, no weight changes, no fever, chills or weakness MOUTH and THROAT: Denies oral discomfort, gingival pain or bleeding RESPIRATORY: no cough, SOB, DOE, wheezing, hemoptysis CARDIAC: No chest pain, edema or palpitations GI: No abdominal pain, diarrhea, constipation, heart burn,  nausea or vomiting PSYCHIATRIC: Denies feelings of depression or anxiety. No report of hallucinations, insomnia, paranoia, or agitation    Immunization History  Administered Date(s) Administered  . Influenza Split 03/31/2014  . Influenza, High Dose Seasonal PF 03/24/2017  . Influenza,inj,Quad PF,6+ Mos 05/20/2015  . PPD Test 08/08/2017  . Pneumococcal Polysaccharide-23 03/31/2014   Pertinent  Health Maintenance Due  Topic Date Due  . DEXA SCAN  05/25/1997  . INFLUENZA VACCINE  01/04/2018  . PNA vac Low Risk Adult (2 of 2 - PCV13) 01/19/2022 (Originally 04/01/2015)   Fall Risk  08/01/2017  Falls in the past year? No    Vitals:   02/08/18 1347  BP: (!) 150/77  Pulse: 78  Resp: 16  Temp: 98.3 F (36.8 C)  TempSrc: Oral  SpO2: 97%  Weight: 98 lb 14.4 oz (44.9 kg)  Height: 5' (1.524 m)   Body mass index is 19.32 kg/m.  Physical Exam  GENERAL APPEARANCE:  In no acute distress.  SKIN:  Skin is warm and dry.  MOUTH and THROAT: Lips are without lesions. Oral mucosa is moist and without lesions.  RESPIRATORY: Breathing  is even & unlabored, BS CTAB CARDIAC: RRR, no murmur,no extra heart sounds, no edema GI: Abdomen soft, normal BS, no masses, no tenderness EXTREMITIES: Able to move X 4 extremities PSYCHIATRIC: Alert to self, disoriented to time and place. Affect and behavior are appropriate   Labs reviewed: Recent Labs    07/13/17 1550  08/09/17 2230 08/17/17 1535 08/31/17 1208  NA  --    < > 136 139 136  K  --    < > 4.7 3.6 3.5  CL  --    < > 95* 101 96*  CO2  --    < > 28 27 25   GLUCOSE  --    < > 138* 169* 165*  BUN  --    < > 79* 43* 46*  CREATININE  --    < > 2.59* 1.69* 1.72*  CALCIUM  --    < > 8.9 9.1 9.3  MG 2.2  --   --  2.2  --    < > = values in this interval not displayed.   Recent Labs    08/09/17 2230 08/17/17 1535 08/31/17 1208  AST 25 33 30  ALT 30 27 21   ALKPHOS 89 107 161*  BILITOT 1.0 0.6 0.6  PROT 6.0* 6.2* 6.8  ALBUMIN 3.3* 3.4*  3.3*   Recent Labs    08/09/17 2230 08/17/17 1535 08/31/17 1208  WBC 7.1 5.9 8.2  NEUTROABS 4.4 4.0 5.4  HGB 10.1* 10.7* 10.7*  HCT 30.1* 31.8* 31.9*  MCV 90.3 91.9 90.2  PLT 167 222 323   Lab Results  Component Value Date   TSH 2.837 08/17/2017    Lab Results  Component Value Date   CHOL 239 (H) 08/17/2017   HDL 54 08/17/2017   LDLCALC 155 (H) 08/17/2017   TRIG 151 (H) 08/17/2017   CHOLHDL 4.4 08/17/2017    Assessment/Plan  1. Chronic obstructive pulmonary disease, unspecified COPD type ([place]) -no S OB, continue Proventil HFA as needed, Anoro Ellipta 1 puff daily   2. Hypothyroidism due to acquired atrophy of thyroid -continue levothyroxine 75 mcg 1 tab daily Lab Results  Component Value Date   TSH 2.837 08/17/2017     3. Iron deficiency anemia due to chronic blood loss  -continue ferrous sulfate 325 mg 1 tab twice a day   4. Vascular dementia without behavioral disturbance ([facility]) -  Continue Aricept 5 mg 1 tab daily, supportive care, fall precautions, followed up by palliative care   5.  Poor appetite -  Body mass index is 19.32 kg/m. continue Marinol 2.5 mg 1 capsule twice a day and Eldertonic 15 mL p.o. with meals Last Weight  Most recent update: 02/08/2018  1:50 PM   Weight  44.9 kg (98 lb 14.4 oz)              Family/ staff Communication: Discussed plan of care with resident.  Labs/tests ordered: None  Goals of care:   Long term/palliative care   Durenda Age, NP [facility] [phone] (Monday-Friday 8:00 a.m. - 5:00 p.m.) [phone] (after hours)

## 2018-03-06 ENCOUNTER — Encounter
Admission: RE | Admit: 2018-03-06 | Discharge: 2018-03-06 | Disposition: A | Discharge status: Home / Self Care | Payer: Medicare HMO | Source: Ambulatory Visit | Attending: Internal Medicine | Admitting: Internal Medicine

## 2018-03-06 ENCOUNTER — Other Ambulatory Visit
Admission: RE | Admit: 2018-03-06 | Discharge: 2018-03-06 | Disposition: A | Discharge status: Home / Self Care | Payer: Medicare HMO | Source: Ambulatory Visit | Attending: Internal Medicine | Admitting: Internal Medicine

## 2018-03-06 DIAGNOSIS — D649 Anemia, unspecified: Secondary | ICD-10-CM | POA: Insufficient documentation

## 2018-03-06 DIAGNOSIS — E559 Vitamin D deficiency, unspecified: Secondary | ICD-10-CM | POA: Insufficient documentation

## 2018-03-06 LAB — COMPREHENSIVE METABOLIC PANEL
ALBUMIN: 3.6 g/dL (ref 3.5–5.0)
ALT: 15 U/L (ref 0–44)
ANION GAP: 10 (ref 5–15)
AST: 22 U/L (ref 15–41)
Alkaline Phosphatase: 108 U/L (ref 38–126)
BUN: 36 mg/dL — AB (ref 8–23)
CO2: 30 mmol/L (ref 22–32)
Calcium: 9.4 mg/dL (ref 8.9–10.3)
Chloride: 100 mmol/L (ref 98–111)
Creatinine, Ser: 1.62 mg/dL — ABNORMAL HIGH (ref 0.44–1.00)
GFR calc Af Amer: 32 mL/min — ABNORMAL LOW (ref >60)
GFR calc non Af Amer: 28 mL/min — ABNORMAL LOW (ref >60)
GLUCOSE: 98 mg/dL (ref 70–99)
POTASSIUM: 3.5 mmol/L (ref 3.5–5.1)
SODIUM: 140 mmol/L (ref 135–145)
Total Bilirubin: 0.7 mg/dL (ref 0.3–1.2)
Total Protein: 6.4 g/dL — ABNORMAL LOW (ref 6.5–8.1)

## 2018-03-06 LAB — CBC WITH DIFFERENTIAL/PLATELET
BASOS ABS: 0 10*3/uL (ref 0–0.1)
BASOS PCT: 1 %
EOS ABS: 0.1 10*3/uL (ref 0–0.7)
Eosinophils Relative: 2 %
HCT: 38.3 % (ref 35.0–47.0)
HEMOGLOBIN: 13.4 g/dL (ref 12.0–16.0)
Lymphocytes Relative: 17 %
Lymphs Abs: 1.2 10*3/uL (ref 1.0–3.6)
MCH: 31.3 pg (ref 26.0–34.0)
MCHC: 35 g/dL (ref 32.0–36.0)
MCV: 89.5 fL (ref 80.0–100.0)
MONOS PCT: 20 %
Monocytes Absolute: 1.4 10*3/uL — ABNORMAL HIGH (ref 0.2–0.9)
NEUTROS ABS: 4.3 10*3/uL (ref 1.4–6.5)
NEUTROS PCT: 60 %
Platelets: 180 10*3/uL (ref 150–440)
RBC: 4.28 MIL/uL (ref 3.80–5.20)
RDW: 13.6 % (ref 11.5–14.5)
WBC: 7.1 10*3/uL (ref 3.6–11.0)

## 2018-03-07 ENCOUNTER — Encounter: Payer: Self-pay | Admitting: Adult Health

## 2018-03-07 ENCOUNTER — Non-Acute Institutional Stay (SKILLED_NURSING_FACILITY): Payer: Medicare HMO | Admitting: Adult Health

## 2018-03-07 DIAGNOSIS — F418 Other specified anxiety disorders: Secondary | ICD-10-CM | POA: Diagnosis not present

## 2018-03-07 DIAGNOSIS — I25709 Atherosclerosis of coronary artery bypass graft(s), unspecified, with unspecified angina pectoris: Secondary | ICD-10-CM

## 2018-03-07 DIAGNOSIS — R627 Adult failure to thrive: Secondary | ICD-10-CM | POA: Diagnosis not present

## 2018-03-07 DIAGNOSIS — K2210 Ulcer of esophagus without bleeding: Secondary | ICD-10-CM | POA: Diagnosis not present

## 2018-03-07 DIAGNOSIS — F0150 Vascular dementia without behavioral disturbance: Secondary | ICD-10-CM | POA: Diagnosis not present

## 2018-03-07 NOTE — Progress Notes (Signed)
[facility]:  [facility] Number: [id number] Place of Service:  SNF ([phone]) Provider:  Durenda Age, NP  Patient Care Team: Kirk Ruths, MD as PCP - General (Internal Medicine)  Extended Emergency Contact Information Primary Emergency Contact: Drema Balzarine Address: [address]          [place], [place] [address] Home Phone: [phone] Mobile Phone: [phone] Relation: Daughter Secondary Emergency Contact: [place] Phone: [phone] Mobile Phone: [phone] Relation: Daughter  Code Status:  Full Code  Goals of care: Advanced Directive information Advanced Directives 02/08/2018  Does Patient Have a Medical Advance Directive? Yes  Type of Paramedic of [place];Living will  Does patient want to make changes to medical advance directive? No - Patient declined  Copy of [place] in Chart? Yes  Would patient like information on creating a medical advance directive? -     Chief Complaint  Patient presents with  . Medical Management of Chronic Issues    Routine [facility] visit    HPI:  Pt is an 84 y.o. female seen today for medical management of chronic diseases.  She is a long-term care resident at [facility].  She has a PMH of atrial fibrillation, CAD, dementia, hypothyroidism, and hypertension. She was seen in her room today with son at bedside.    Past Medical History:  Diagnosis Date  . Anxiety    unspecified  . Atrial fibrillation ([place])   . COPD (chronic obstructive pulmonary disease) ([place]) 10/05/2013  . Coronary artery disease 10/05/2013  . Dementia ([place])   . Depression   . Diastolic heart failure ([place])   . Hyperlipidemia, unspecified   . Hypertension 10/05/2013  . Hypothyroidism   . Hypothyroidism, acquired    unspecified  . PVD (peripheral vascular disease) ([place]) 10/05/2013   iliac stents   Past Surgical History:  Procedure Laterality Date    . ABDOMINAL HYSTERECTOMY    . CARDIAC SURGERY    . CARDIOVERSION    . CATARACT EXTRACTION Bilateral 2012  . CORONARY ARTERY BYPASS GRAFT  1985   x3  . ESOPHAGOGASTRODUODENOSCOPY N/A 07/09/2017   Procedure: ESOPHAGOGASTRODUODENOSCOPY (EGD);  Surgeon: Lin Landsman, MD;  Location: [facility] ENDOSCOPY;  Service: Gastroenterology;  Laterality: N/A;  . [place]   patient unsure of which side  . HIP PINNING,CANNULATED Right 05/19/2015   Procedure: CANNULATED HIP PINNING;  Surgeon: Hessie Knows, MD;  Location: [facility] ORS;  Service: Orthopedics;  Laterality: Right;    Allergies  Allergen Reactions  . Hydrocodone Other (See Comments)  . Oxycodone Hives    Outpatient Encounter Medications as of 03/07/2018  Medication Sig  . acetaminophen (TYLENOL) 325 MG tablet Take 650 mg by mouth every 4 (four) hours as needed. for pain/ increased temp. May be administered orally, per G-tube if needed or rectally if unable to swallow (separate order). Maximum dose for 24 hours is 3,000 mg from all sources of Acetaminophen/ Tylenol  . albuterol (PROVENTIL HFA;VENTOLIN HFA) 108 (90 BASE) MCG/ACT inhaler Inhale 2 puffs into the lungs every 6 (six) hours as needed for wheezing or shortness of breath.  Marland Kitchen alum & mag hydroxide-simeth (MAALOX/MYLANTA) 200-200-20 MG/5ML suspension Take 30 mLs by mouth every 4 (four) hours as needed for indigestion.  . Amino Acids-Protein Hydrolys (FEEDING SUPPLEMENT, PRO-STAT SUGAR FREE 64,) LIQD Take 30 mLs by mouth 2 (two) times daily between meals.  Marland Kitchen aspirin EC 81 MG tablet Take 81 mg by mouth daily.   Marland Kitchen  bisacodyl (DULCOLAX) 10 MG suppository Place 10 mg rectally daily as needed.  Marland Kitchen buPROPion (WELLBUTRIN) 75 MG tablet Take 75 mg by mouth daily.   . Cholecalciferol (VITAMIN D) 2000 units CAPS Take 1 capsule by mouth daily.  Marland Kitchen docusate sodium (COLACE) 100 MG capsule Take 1 capsule (100 mg total) by mouth 2 (two) times daily.  Marland Kitchen donepezil (ARICEPT) 5 MG tablet Take 5  mg by mouth daily.   Marland Kitchen dronabinol (MARINOL) 2.5 MG capsule Take 2.5 mg by mouth 2 (two) times daily with a meal.   . feeding supplement, ENSURE ENLIVE, (ENSURE ENLIVE) LIQD Take 237 mLs by mouth 2 (two) times daily. give with a meal for nutrition  . ferrous sulfate 325 (65 FE) MG tablet Take 1 tablet (325 mg total) by mouth 2 (two) times daily with a meal.  . geriatric multivitamins-minerals (ELDERTONIC/GEVRABON) ELIX Take 15 mLs by mouth daily.   . Infant Care Products (DERMACLOUD) CREA Apply liberal amount topically to area of skin irritation daily as needed. OK to leave at bedside.  . levalbuterol (XOPENEX) 1.25 MG/3ML nebulizer solution Take 1.25 mg by nebulization every 6 (six) hours as needed for wheezing.  Marland Kitchen levothyroxine (SYNTHROID, LEVOTHROID) 75 MCG tablet Take 1 tablet (75 mcg total) by mouth daily before breakfast.  . nitroGLYCERIN (NITROSTAT) 0.4 MG SL tablet Place 0.4 mg under the tongue every 5 (five) minutes as needed for chest pain.  . Nutritional Supplements (NUTRITIONAL SUPPLEMENT PO) Diet Type: Heart Healthy- NO added salt  . ondansetron (ZOFRAN) 4 MG tablet Take 4 mg by mouth every 8 (eight) hours as needed for nausea or vomiting.  . OXYGEN Inhale 2 L into the lungs continuous as needed.   . pantoprazole (PROTONIX) 40 MG tablet Take 1 tablet (40 mg total) by mouth 2 (two) times daily.  . sertraline (ZOLOFT) 50 MG tablet Take 75 mg by mouth daily. 1 and 1/2 tab  . Skin Protectants, Misc. (ENDIT EX) Apply topically. Apply 1 application topically BID and PRN to peri-area/buttocks  . Umeclidinium-Vilanterol (ANORO ELLIPTA) 62.5-25 MCG/INH AEPB Inhale 1 puff into the lungs daily.    No facility-administered encounter medications on file as of 03/07/2018.     Review of Systems  GENERAL: No change in appetite, no fatigue, no weight changes, no fever, chills or weakness MOUTH and THROAT: Denies oral discomfort, gingival pain or bleeding RESPIRATORY: no cough, SOB, DOE, wheezing,  hemoptysis CARDIAC: No chest pain, edema or palpitations GI: No abdominal pain, diarrhea, constipation, heart burn, nausea or vomiting GU: Denies dysuria, frequency, hematuria, incontinence, or discharge PSYCHIATRIC: Denies feelings of depression or anxiety. No report of hallucinations, insomnia, paranoia, or agitation   Immunization History  Administered Date(s) Administered  . Influenza Split 03/31/2014  . Influenza, High Dose Seasonal PF 03/24/2017  . Influenza,inj,Quad PF,6+ Mos 05/20/2015  . PPD Test 08/08/2017  . Pneumococcal Polysaccharide-23 03/31/2014   Pertinent  Health Maintenance Due  Topic Date Due  . INFLUENZA VACCINE  01/04/2018  . PNA vac Low Risk Adult (2 of 2 - PCV13) 01/19/2022 (Originally 04/01/2015)  . DEXA SCAN  Discontinued   Fall Risk  08/01/2017  Falls in the past year? No      Vitals:   03/07/18 0903  BP: (!) 165/90  Pulse: 80  Resp: 20  Temp: 97.7 F (36.5 C)  TempSrc: Oral  SpO2: 98%  Weight: 98 lb 9.6 oz (44.7 kg)  Height: 5' (1.524 m)   Body mass index is 19.26 kg/m.  Physical Exam  GENERAL APPEARANCE:  In no acute distress. Thin SKIN:  Skin is warm and dry.  MOUTH and THROAT: Lips are without lesions. Oral mucosa is moist and without lesions. Tongue is normal in shape, size, and color and without lesions RESPIRATORY: Breathing is even & unlabored, BS CTAB CARDIAC: RRR, no murmur,no extra heart sounds, no edema GI: Abdomen soft, normal BS, no masses, no tenderness EXTREMITIES:  Able to move X 4 extremities NEUROLOGICAL: There is no tremor. Speech is clear PSYCHIATRIC: Alert to self and place, disoriented to time. Affect and behavior are appropriate   Labs reviewed: Recent Labs    07/13/17 1550  08/17/17 1535 08/31/17 1208 03/06/18 0643  NA  --    < > 139 136 140  K  --    < > 3.6 3.5 3.5  CL  --    < > 101 96* 100  CO2  --    < > 27 25 30   GLUCOSE  --    < > 169* 165* 98  BUN  --    < > 43* 46* 36*  CREATININE  --    < >  1.69* 1.72* 1.62*  CALCIUM  --    < > 9.1 9.3 9.4  MG 2.2  --  2.2  --   --    < > = values in this interval not displayed.   Recent Labs    08/17/17 1535 08/31/17 1208 03/06/18 0643  AST 33 30 22  ALT 27 21 15   ALKPHOS 107 161* 108  BILITOT 0.6 0.6 0.7  PROT 6.2* 6.8 6.4*  ALBUMIN 3.4* 3.3* 3.6   Recent Labs    08/17/17 1535 08/31/17 1208 03/06/18 0643  WBC 5.9 8.2 7.1  NEUTROABS 4.0 5.4 4.3  HGB 10.7* 10.7* 13.4  HCT 31.8* 31.9* 38.3  MCV 91.9 90.2 89.5  PLT 222 323 180   Lab Results  Component Value Date   TSH 2.837 08/17/2017    Lab Results  Component Value Date   CHOL 239 (H) 08/17/2017   HDL 54 08/17/2017   LDLCALC 155 (H) 08/17/2017   TRIG 151 (H) 08/17/2017   CHOLHDL 4.4 08/17/2017    Assessment/Plan  1. Esophagitis, erosive - stable, continue Pantoprazole 40 mg 1 tab BID   2. Coronary artery disease involving coronary bypass graft of native heart with angina pectoris (HCC)  -  No complaints of chest pains, continue ASA EC 81 mg daily and NTG PRN   3. Failure to thrive in adult - Body mass index is 19.26 kg/m. continue Marinol 2.5 mg 1 capsule BID, Eldertonic 15 ml BID, pro-stat sugar-free 30 mL twice a day Last Weight  Most recent update: 03/07/2018  9:04 AM   Weight  44.7 kg (98 lb 9.6 oz)            4. Depression with anxiety -mood this is stable, continue sertraline 50 mg 1 1/2 tab = 75 mg daily and bupropion 75 mg 1 tab daily   5. Vascular dementia without behavioral disturbance ([place]) -continue supportive care, fall precautions, Aricept 5 mg 1 tab daily, followed up by palliative care       Family/ staff Communication:  Discussed plan of care with resident.  Labs/tests ordered:  None  Goals of care:   Long-term care/palliative care.   Durenda Age, NP [facility] and Adult Medicine [phone] (Monday-Friday 8:00 a.m. - 5:00 p.m.) [phone] (after hours)

## 2018-03-14 DIAGNOSIS — Z789 Other specified health status: Secondary | ICD-10-CM | POA: Diagnosis not present

## 2018-03-14 DIAGNOSIS — E440 Moderate protein-calorie malnutrition: Secondary | ICD-10-CM | POA: Diagnosis not present

## 2018-03-14 DIAGNOSIS — I480 Paroxysmal atrial fibrillation: Secondary | ICD-10-CM | POA: Diagnosis not present

## 2018-03-14 DIAGNOSIS — F0391 Unspecified dementia with behavioral disturbance: Secondary | ICD-10-CM | POA: Diagnosis not present

## 2018-03-14 DIAGNOSIS — I739 Peripheral vascular disease, unspecified: Secondary | ICD-10-CM | POA: Diagnosis not present

## 2018-03-14 DIAGNOSIS — I25810 Atherosclerosis of coronary artery bypass graft(s) without angina pectoris: Secondary | ICD-10-CM | POA: Diagnosis not present

## 2018-03-14 DIAGNOSIS — J440 Chronic obstructive pulmonary disease with acute lower respiratory infection: Secondary | ICD-10-CM | POA: Diagnosis not present

## 2018-03-14 DIAGNOSIS — J431 Panlobular emphysema: Secondary | ICD-10-CM | POA: Diagnosis not present

## 2018-04-06 ENCOUNTER — Encounter
Admission: RE | Admit: 2018-04-06 | Discharge: 2018-04-06 | Disposition: A | Discharge status: Home / Self Care | Payer: Medicare HMO | Source: Ambulatory Visit | Attending: Internal Medicine | Admitting: Internal Medicine

## 2018-04-06 DIAGNOSIS — D649 Anemia, unspecified: Secondary | ICD-10-CM | POA: Insufficient documentation

## 2018-04-06 DIAGNOSIS — E559 Vitamin D deficiency, unspecified: Secondary | ICD-10-CM | POA: Insufficient documentation

## 2018-04-10 ENCOUNTER — Encounter: Payer: Self-pay | Admitting: Adult Health

## 2018-04-10 ENCOUNTER — Other Ambulatory Visit: Payer: Self-pay | Admitting: Adult Health

## 2018-04-10 ENCOUNTER — Non-Acute Institutional Stay (SKILLED_NURSING_FACILITY): Payer: Medicare HMO | Admitting: Adult Health

## 2018-04-10 DIAGNOSIS — D500 Iron deficiency anemia secondary to blood loss (chronic): Secondary | ICD-10-CM

## 2018-04-10 DIAGNOSIS — F0150 Vascular dementia without behavioral disturbance: Secondary | ICD-10-CM | POA: Diagnosis not present

## 2018-04-10 DIAGNOSIS — R627 Adult failure to thrive: Secondary | ICD-10-CM | POA: Diagnosis not present

## 2018-04-10 DIAGNOSIS — Z515 Encounter for palliative care: Secondary | ICD-10-CM | POA: Diagnosis not present

## 2018-04-10 DIAGNOSIS — F418 Other specified anxiety disorders: Secondary | ICD-10-CM | POA: Diagnosis not present

## 2018-04-10 DIAGNOSIS — J441 Chronic obstructive pulmonary disease with (acute) exacerbation: Secondary | ICD-10-CM | POA: Diagnosis not present

## 2018-04-10 DIAGNOSIS — I5032 Chronic diastolic (congestive) heart failure: Secondary | ICD-10-CM | POA: Diagnosis not present

## 2018-04-10 MED ORDER — DRONABINOL 2.5 MG PO CAPS: 2.5000 mg | ORAL_CAPSULE | Freq: Every day | ORAL | 0 refills | Status: DC

## 2018-04-10 NOTE — Progress Notes (Signed)
[facility] Room Number: [place] of Service:  SNF (31)   CODE STATUS: Full Code  Allergies  Allergen Reactions  . Hydrocodone Other (See Comments)  . Oxycodone Hives    Chief Complaint  Patient presents with  . Medical Management of Chronic Issues    Vascular dementia without behavioral disturbance; depression with anxiety; failure to thrive in adult; iron deficiency anemia due to chronic blood loss     HPI:  She is a 84 year old long term resident of this facility being seen for the management of her chronic illnesses: dementia; depression; failure to thrive anemia. She denies any anxiety or depressive thoughts; no changes in her appetite; no uncontrolled pain.    Past Medical History:  Diagnosis Date  . Anxiety    unspecified  . Atrial fibrillation ([place])   . COPD (chronic obstructive pulmonary disease) ([place]) 10/05/2013  . Coronary artery disease 10/05/2013  . Dementia ([place])   . Depression   . Diastolic heart failure ([place])   . Hyperlipidemia, unspecified   . Hypertension 10/05/2013  . Hypothyroidism   . Hypothyroidism, acquired    unspecified  . PVD (peripheral vascular disease) ([place]) 10/05/2013   iliac stents    Past Surgical History:  Procedure Laterality Date  . ABDOMINAL HYSTERECTOMY    . CARDIAC SURGERY    . CARDIOVERSION    . CATARACT EXTRACTION Bilateral 2012  . CORONARY ARTERY BYPASS GRAFT  1985   x3  . ESOPHAGOGASTRODUODENOSCOPY N/A 07/09/2017   Procedure: ESOPHAGOGASTRODUODENOSCOPY (EGD);  Surgeon: Lin Landsman, MD;  Location: [facility] ENDOSCOPY;  Service: Gastroenterology;  Laterality: N/A;  . [place]   patient unsure of which side  . HIP PINNING,CANNULATED Right 05/19/2015   Procedure: CANNULATED HIP PINNING;  Surgeon: Hessie Knows, MD;  Location: [facility] ORS;  Service: Orthopedics;  Laterality: Right;    Social History   Socioeconomic History  . Marital status: Divorced    Spouse  name: Not on file  . Number of children: Not on file  . Years of education: Not on file  . Highest education level: Not on file  Occupational History  . Occupation: retired  Scientific laboratory technician  . Financial resource strain: Not hard at all  . Food insecurity:    Worry: Never true    Inability: Never true  . Transportation needs:    Medical: No    Non-medical: No  Tobacco Use  . Smoking status: Former Smoker    Packs/day: 0.25    Years: 30.00    Pack years: 7.50    Types: Cigarettes    Last attempt to quit: 07/01/2017    Years since quitting: 0.7  . Smokeless tobacco: Never Used  Substance and Sexual Activity  . Alcohol use: Yes    Comment: rare, glass of wine occasionally  . Drug use: No  . Sexual activity: Never  Lifestyle  . Physical activity:    Days per week: 5 days    Minutes per session: 30 min  . Stress: Not at all  Relationships  . Social connections:    Talks on phone: Not on file    Gets together: Not on file    Attends religious service: Not on file    Active member of club or organization: Not on file    Attends meetings of clubs or organizations: Not on file    Relationship status: Not on file  . Intimate partner violence:  Fear of current or ex partner: Not on file    Emotionally abused: Not on file    Physically abused: Not on file    Forced sexual activity: Not on file  Other Topics Concern  . Not on file  Social History Narrative  . Not on file   Family History  Problem Relation Age of Onset  . Stroke Mother   . Cancer Sister   . Parkinsonism Brother       VITAL SIGNS BP (!) 117/94   Pulse 84   Temp 98.1 F (36.7 C)   Resp 14   Ht 5' (1.524 m)   Wt 96 lb 11.2 oz (43.9 kg)   SpO2 98%   BMI 18.89 kg/m   Outpatient Encounter Medications as of 04/10/2018  Medication Sig  . acetaminophen (TYLENOL) 325 MG tablet Take 650 mg by mouth every 4 (four) hours as needed. for pain/ increased temp. May be administered orally, per G-tube if needed  or rectally if unable to swallow (separate order). Maximum dose for 24 hours is 3,000 mg from all sources of Acetaminophen/ Tylenol  . albuterol (PROVENTIL HFA;VENTOLIN HFA) 108 (90 BASE) MCG/ACT inhaler Inhale 2 puffs into the lungs every 6 (six) hours as needed for wheezing or shortness of breath.  Marland Kitchen alum & mag hydroxide-simeth (MAALOX/MYLANTA) 200-200-20 MG/5ML suspension Take 30 mLs by mouth every 4 (four) hours as needed for indigestion.  . Amino Acids-Protein Hydrolys (FEEDING SUPPLEMENT, PRO-STAT SUGAR FREE 64,) LIQD Take 30 mLs by mouth 2 (two) times daily between meals.  Marland Kitchen aspirin EC 81 MG tablet Take 81 mg by mouth daily.   . bisacodyl (DULCOLAX) 10 MG suppository Place 10 mg rectally daily as needed.  Marland Kitchen buPROPion (WELLBUTRIN) 75 MG tablet Take 75 mg by mouth daily.   . Cholecalciferol (VITAMIN D) 2000 units CAPS Take 1 capsule by mouth daily.  Marland Kitchen docusate sodium (COLACE) 100 MG capsule Take 1 capsule (100 mg total) by mouth 2 (two) times daily.  Marland Kitchen donepezil (ARICEPT) 5 MG tablet Take 5 mg by mouth daily.   Marland Kitchen dronabinol (MARINOL) 2.5 MG capsule Take 2.5 mg by mouth 2 (two) times daily with a meal.   . feeding supplement, ENSURE ENLIVE, (ENSURE ENLIVE) LIQD Take 237 mLs by mouth 2 (two) times daily. give with a meal for nutrition  . ferrous sulfate 325 (65 FE) MG tablet Take 325 mg by mouth daily with breakfast.  . geriatric multivitamins-minerals (ELDERTONIC/GEVRABON) ELIX Take 15 mLs by mouth 3 (three) times daily with meals.   . Infant Care Products (DERMACLOUD) CREA Apply liberal amount topically to area of skin irritation daily as needed. OK to leave at bedside.  . levalbuterol (XOPENEX) 1.25 MG/3ML nebulizer solution Take 1.25 mg by nebulization every 6 (six) hours as needed for wheezing.  Marland Kitchen levothyroxine (SYNTHROID, LEVOTHROID) 75 MCG tablet Take 1 tablet (75 mcg total) by mouth daily before breakfast.  . nitroGLYCERIN (NITROSTAT) 0.4 MG SL tablet Place 0.4 mg under the tongue every 5  (five) minutes as needed for chest pain.  . Nutritional Supplements (NUTRITIONAL SUPPLEMENT PO) Diet Type: Heart Healthy- NO added salt  . ondansetron (ZOFRAN) 4 MG tablet Take 4 mg by mouth every 8 (eight) hours as needed for nausea or vomiting.  . OXYGEN Inhale 2 L into the lungs continuous as needed.   . pantoprazole (PROTONIX) 40 MG tablet Take 1 tablet (40 mg total) by mouth 2 (two) times daily.  . polyethylene glycol (MIRALAX / GLYCOLAX) packet Take 17  g by mouth daily.  . sertraline (ZOLOFT) 50 MG tablet Take 50 mg by mouth daily.   . Skin Protectants, Misc. (ENDIT EX) Apply topically. Apply 1 application topically  PRN to peri-area/buttocks  . Umeclidinium-Vilanterol (ANORO ELLIPTA) 62.5-25 MCG/INH AEPB Inhale 1 puff into the lungs daily.   . [DISCONTINUED] ferrous sulfate 325 (65 FE) MG tablet Take 1 tablet (325 mg total) by mouth 2 (two) times daily with a meal. (Patient not taking: Reported on 04/10/2018)   No facility-administered encounter medications on file as of 04/10/2018.      SIGNIFICANT DIAGNOSTIC EXAMS  LABS REVIEWED:   08-17-17: chol 239; ldl 155; trig 151; hdl 54; tsh 2.837; mag 2.2; vit D 25.4 08-31-17: wbc 8.2; hgb 10.7; hct 31.9; mcv 90.2; plt 323 glucose 165; bun 46; creat 1.72; k+ 3.5 na++136 ca 9.3 liver normal albumin 3.3  TODAY:   03-06-18: wbc  7.1; hgb 13.4; hct 38.3; mcv 89.5; plt 180; glucose 98; bun 36; creat 1.62; k+ 3.5; na++ 140; ca 9.4 liver normal albumin 3.6    Review of Systems  Constitutional: Negative for malaise/fatigue.  Respiratory: Negative for cough and shortness of breath.   Cardiovascular: Negative for chest pain, palpitations and leg swelling.  Gastrointestinal: Negative for abdominal pain, constipation and heartburn.  Musculoskeletal: Negative for back pain, joint pain and myalgias.  Skin: Negative.   Neurological: Negative for dizziness.  Psychiatric/Behavioral: The patient is not nervous/anxious.     Physical Exam    Constitutional: No distress.  Frail   Neck: No thyromegaly present.  Cardiovascular: Normal rate, normal heart sounds and intact distal pulses.  Heart rate irregular   Pulmonary/Chest: Effort normal and breath sounds normal. No respiratory distress.  Abdominal: Soft. Bowel sounds are normal. She exhibits no distension. There is no tenderness.  Musculoskeletal: She exhibits no edema.  Is able to move all extremities   Lymphadenopathy:    She has no cervical adenopathy.  Neurological: She is alert.  Skin: Skin is warm and dry. She is not diaphoretic.  Psychiatric: She has a normal mood and affect.     ASSESSMENT/PLAN  TODAY:   1. Failure to thrive: is without change weight is 98 pounds; albumin is 3.6 will continue eldertonic 15 cc three times daily; will wean off marinol more than likely she is not receiving benefit from this medication   2. Iron deficiency anemia due to chronic blood loss: stable hgb 13.4; will stop iron   3. Depression with anxiety: is stable will continue wellbutrin 75 mg daily and zoloft 50 mg daily   4. Vascular dementia: no change in status; weight is 96(previous98) pounds; will stop aricept due to low body weight   PREVIOUS   5. Large hiatal hernia/esophagitis, erosive: is stable will continue protonix 40 mg twice daily   6, hypothyroidism due to acquired atrophy of thyroid: stable tsh 2.837; will continue synthroid 75 mcg daily  7. Chronic constipation: is stable will continue colace twice daily   8. Chronic right hip pain: is stable will continue lidoderm 4% patch to right hip daily   9. Paroxymal atrial fibrillation: heart rate is controlled; will continue asa 81 mg daily has prn ntg for chest pain.   10. Chronic diastolic heart failure: is stable will continue asa 81 mg daily will continue to monitor  11. COPD: is stable is 02 dependent; will continue albuterol 2 puffs every 6 hours as needed; xopenex 1.25 neb treatment  every 6 hours as needed;  and anoro 62.5/25  mcg 1 puff daily   12.  CAD is status post CABG X3: is stable will continue asa 81 mg daily has prn ntg    MD is aware of resident's narcotic use and is in agreement with current plan of care. We will attempt to wean resident as apropriate   Ok Edwards NP [facility] Adult Medicine  Contact [phone] Monday through Friday 8am- 5pm  After hours call [phone]

## 2018-04-11 ENCOUNTER — Encounter: Payer: Self-pay | Admitting: Adult Health

## 2018-04-11 ENCOUNTER — Non-Acute Institutional Stay (SKILLED_NURSING_FACILITY): Payer: Medicare HMO | Admitting: Adult Health

## 2018-04-11 DIAGNOSIS — I5032 Chronic diastolic (congestive) heart failure: Secondary | ICD-10-CM | POA: Diagnosis not present

## 2018-04-11 DIAGNOSIS — R627 Adult failure to thrive: Secondary | ICD-10-CM | POA: Diagnosis not present

## 2018-04-11 DIAGNOSIS — F0150 Vascular dementia without behavioral disturbance: Secondary | ICD-10-CM | POA: Diagnosis not present

## 2018-04-11 NOTE — Progress Notes (Signed)
[facility]   [facility] Room Number: [place] of Service:  SNF (31)   CODE STATUS: Full Code  Allergies  Allergen Reactions  . Hydrocodone Other (See Comments)  . Oxycodone Hives    Chief Complaint  Patient presents with  . Acute Visit    Care Plan Meeting    HPI:  We have come together for her routine care plan meeting. She has a poor intake with most of her meals <50%. She has had 2 falls on 04-01-18 without injury. There are reports of uncontrolled pain. She is being weaned off her marinol as this medication has not helped with her appetite and to gain weight. There are no reports of uncontrolled pain. Her medications and care plan.    Past Medical History:  Diagnosis Date  . Anxiety    unspecified  . Atrial fibrillation ([place])   . COPD (chronic obstructive pulmonary disease) ([place]) 10/05/2013  . Coronary artery disease 10/05/2013  . Dementia ([place])   . Depression   . Diastolic heart failure ([place])   . Hyperlipidemia, unspecified   . Hypertension 10/05/2013  . Hypothyroidism   . Hypothyroidism, acquired    unspecified  . PVD (peripheral vascular disease) ([place]) 10/05/2013   iliac stents    Past Surgical History:  Procedure Laterality Date  . ABDOMINAL HYSTERECTOMY    . CARDIAC SURGERY    . CARDIOVERSION    . CATARACT EXTRACTION Bilateral 2012  . CORONARY ARTERY BYPASS GRAFT  1985   x3  . ESOPHAGOGASTRODUODENOSCOPY N/A 07/09/2017   Procedure: ESOPHAGOGASTRODUODENOSCOPY (EGD);  Surgeon: Lin Landsman, MD;  Location: [facility] ENDOSCOPY;  Service: Gastroenterology;  Laterality: N/A;  . [place]   patient unsure of which side  . HIP PINNING,CANNULATED Right 05/19/2015   Procedure: CANNULATED HIP PINNING;  Surgeon: Hessie Knows, MD;  Location: [facility] ORS;  Service: Orthopedics;  Laterality: Right;    Social History   Socioeconomic History  . Marital status: Divorced    Spouse name: Not on file  . Number of  children: Not on file  . Years of education: Not on file  . Highest education level: Not on file  Occupational History  . Occupation: retired  Scientific laboratory technician  . Financial resource strain: Not hard at all  . Food insecurity:    Worry: Never true    Inability: Never true  . Transportation needs:    Medical: No    Non-medical: No  Tobacco Use  . Smoking status: Former Smoker    Packs/day: 0.25    Years: 30.00    Pack years: 7.50    Types: Cigarettes    Last attempt to quit: 07/01/2017    Years since quitting: 0.7  . Smokeless tobacco: Never Used  Substance and Sexual Activity  . Alcohol use: Yes    Comment: rare, glass of wine occasionally  . Drug use: No  . Sexual activity: Never  Lifestyle  . Physical activity:    Days per week: 5 days    Minutes per session: 30 min  . Stress: Not at all  Relationships  . Social connections:    Talks on phone: Not on file    Gets together: Not on file    Attends religious service: Not on file    Active member of club or organization: Not on file    Attends meetings of clubs or organizations: Not on file    Relationship status: Not on file  . Intimate  partner violence:    Fear of current or ex partner: Not on file    Emotionally abused: Not on file    Physically abused: Not on file    Forced sexual activity: Not on file  Other Topics Concern  . Not on file  Social History Narrative  . Not on file   Family History  Problem Relation Age of Onset  . Stroke Mother   . Cancer Sister   . Parkinsonism Brother       VITAL SIGNS BP (!) 117/94   Pulse 84   Temp 98.1 F (36.7 C)   Resp 14   Ht 5\' 1"  (1.549 m)   Wt 96 lb 11.2 oz (43.9 kg)   SpO2 98%   BMI 18.27 kg/m   Outpatient Encounter Medications as of 04/11/2018  Medication Sig  . acetaminophen (TYLENOL) 325 MG tablet Take 650 mg by mouth every 4 (four) hours as needed. for pain/ increased temp. May be administered orally, per G-tube if needed or rectally if unable to  swallow (separate order). Maximum dose for 24 hours is 3,000 mg from all sources of Acetaminophen/ Tylenol  . albuterol (PROVENTIL HFA;VENTOLIN HFA) 108 (90 BASE) MCG/ACT inhaler Inhale 2 puffs into the lungs every 6 (six) hours as needed for wheezing or shortness of breath.  Marland Kitchen alum & mag hydroxide-simeth (MAALOX/MYLANTA) 200-200-20 MG/5ML suspension Take 30 mLs by mouth every 4 (four) hours as needed for indigestion.  . Amino Acids-Protein Hydrolys (FEEDING SUPPLEMENT, PRO-STAT SUGAR FREE 64,) LIQD Take 30 mLs by mouth 2 (two) times daily between meals.  Marland Kitchen aspirin EC 81 MG tablet Take 81 mg by mouth daily.   . bisacodyl (DULCOLAX) 10 MG suppository Place 10 mg rectally daily as needed.  Marland Kitchen buPROPion (WELLBUTRIN) 75 MG tablet Take 75 mg by mouth daily.   . Cholecalciferol (VITAMIN D) 2000 units CAPS Take 1 capsule by mouth daily.  Marland Kitchen docusate sodium (COLACE) 100 MG capsule Take 1 capsule (100 mg total) by mouth 2 (two) times daily.  Marland Kitchen dronabinol (MARINOL) 2.5 MG capsule Take 1 capsule (2.5 mg total) by mouth daily before lunch.  . feeding supplement, ENSURE ENLIVE, (ENSURE ENLIVE) LIQD Take 237 mLs by mouth 2 (two) times daily. give with a meal for nutrition  . geriatric multivitamins-minerals (ELDERTONIC/GEVRABON) ELIX Take 15 mLs by mouth 3 (three) times daily with meals.   . Infant Care Products (DERMACLOUD) CREA Apply liberal amount topically to area of skin irritation daily as needed. OK to leave at bedside.  . levalbuterol (XOPENEX) 1.25 MG/3ML nebulizer solution Take 1.25 mg by nebulization every 6 (six) hours as needed for wheezing.  Marland Kitchen levothyroxine (SYNTHROID, LEVOTHROID) 75 MCG tablet Take 1 tablet (75 mcg total) by mouth daily before breakfast.  . nitroGLYCERIN (NITROSTAT) 0.4 MG SL tablet Place 0.4 mg under the tongue every 5 (five) minutes as needed for chest pain.  . Nutritional Supplements (NUTRITIONAL SUPPLEMENT PO) Diet Type: Heart Healthy- NO added salt  . ondansetron (ZOFRAN) 4 MG  tablet Take 4 mg by mouth every 8 (eight) hours as needed for nausea or vomiting.  . OXYGEN Inhale 2 L into the lungs continuous as needed.   . pantoprazole (PROTONIX) 40 MG tablet Take 1 tablet (40 mg total) by mouth 2 (two) times daily.  . polyethylene glycol (MIRALAX / GLYCOLAX) packet Take 17 g by mouth daily.  . sertraline (ZOLOFT) 50 MG tablet Take 50 mg by mouth daily.   . Skin Protectants, Misc. (ENDIT EX) Apply topically.  Apply 1 application topically  PRN to peri-area/buttocks  . Umeclidinium-Vilanterol (ANORO ELLIPTA) 62.5-25 MCG/INH AEPB Inhale 1 puff into the lungs daily.   . [DISCONTINUED] donepezil (ARICEPT) 5 MG tablet Take 5 mg by mouth daily.   . [DISCONTINUED] ferrous sulfate 325 (65 FE) MG tablet Take 325 mg by mouth daily with breakfast.   No facility-administered encounter medications on file as of 04/11/2018.      SIGNIFICANT DIAGNOSTIC EXAMS   LABS REVIEWED:   08-17-17: chol 239; ldl 155; trig 151; hdl 54; tsh 2.837; mag 2.2; vit D 25.4 08-31-17: wbc 8.2; hgb 10.7; hct 31.9; mcv 90.2; plt 323 glucose 165; bun 46; creat 1.72; k+ 3.5 na++136 ca 9.3 liver normal albumin 3.3 03-06-18: wbc  7.1; hgb 13.4; hct 38.3; mcv 89.5; plt 180; glucose 98; bun 36; creat 1.62; k+ 3.5; na++ 140; ca 9.4 liver normal albumin 3.6   NO NEW LABS.    Review of Systems  Constitutional: Negative for malaise/fatigue.  Respiratory: Negative for cough and shortness of breath.   Cardiovascular: Negative for chest pain, palpitations and leg swelling.  Gastrointestinal: Negative for abdominal pain, constipation and heartburn.  Musculoskeletal: Negative for back pain, joint pain and myalgias.  Skin: Negative.   Neurological: Negative for dizziness.  Psychiatric/Behavioral: The patient is not nervous/anxious.     Physical Exam  Constitutional: No distress.  Frail   Neck: No thyromegaly present.  Cardiovascular: Normal rate, regular rhythm, normal heart sounds and intact distal pulses.    Pulmonary/Chest: Effort normal and breath sounds normal. No respiratory distress.  Abdominal: Soft. Bowel sounds are normal. She exhibits no distension. There is no tenderness.  Musculoskeletal: Normal range of motion. She exhibits no edema.  Lymphadenopathy:    She has no cervical adenopathy.  Neurological: She is alert.  Skin: Skin is warm and dry. She is not diaphoretic.  Psychiatric: She has a normal mood and affect.    ASSESSMENT/ PLAN:  TODAY:   1. Vascular dementia without behavioral disturbance 2. Failure to thrive in adult 3. Chronic diastolic heart failure  Will continue her current plan of care Will continue her current medication regimen.      MD is aware of resident's narcotic use and is in agreement with current plan of care. We will attempt to wean resident as apropriate   Ok Edwards NP [facility]  Contact [phone] Monday through Friday 8am- 5pm  After hours call [phone]

## 2018-04-24 ENCOUNTER — Encounter: Payer: Self-pay | Admitting: Adult Health

## 2018-04-24 ENCOUNTER — Non-Acute Institutional Stay (SKILLED_NURSING_FACILITY): Payer: Medicare HMO | Admitting: Adult Health

## 2018-04-24 DIAGNOSIS — J441 Chronic obstructive pulmonary disease with (acute) exacerbation: Secondary | ICD-10-CM | POA: Diagnosis not present

## 2018-04-24 NOTE — Progress Notes (Signed)
[facility]:   [facility] [place] [facility] Room Number: [place] of Service:  SNF (31)   CODE STATUS: Full Code  Allergies  Allergen Reactions  . Hydrocodone Other (See Comments)  . Oxycodone Hives    Chief Complaint  Patient presents with  . Acute Visit    URI Symptoms    HPI:  Staff reports that she has had a cough without sputum production for the past several days. She denies any fevers; she does have shortness of breath with wheezing present. There are no reports of fevers present. She does have a history of COPD.   Past Medical History:  Diagnosis Date  . Anxiety    unspecified  . Atrial fibrillation ([place])   . COPD (chronic obstructive pulmonary disease) ([place]) 10/05/2013  . Coronary artery disease 10/05/2013  . Dementia ([place])   . Depression   . Diastolic heart failure ([place])   . Hyperlipidemia, unspecified   . Hypertension 10/05/2013  . Hypothyroidism   . Hypothyroidism, acquired    unspecified  . PVD (peripheral vascular disease) ([place]) 10/05/2013   iliac stents    Past Surgical History:  Procedure Laterality Date  . ABDOMINAL HYSTERECTOMY    . CARDIAC SURGERY    . CARDIOVERSION    . CATARACT EXTRACTION Bilateral 2012  . CORONARY ARTERY BYPASS GRAFT  1985   x3  . ESOPHAGOGASTRODUODENOSCOPY N/A 07/09/2017   Procedure: ESOPHAGOGASTRODUODENOSCOPY (EGD);  Surgeon: Lin Landsman, MD;  Location: [facility] ENDOSCOPY;  Service: Gastroenterology;  Laterality: N/A;  . [place]   patient unsure of which side  . HIP PINNING,CANNULATED Right 05/19/2015   Procedure: CANNULATED HIP PINNING;  Surgeon: Hessie Knows, MD;  Location: [facility] ORS;  Service: Orthopedics;  Laterality: Right;    Social History   Socioeconomic History  . Marital status: Divorced    Spouse name: Not on file  . Number of children: Not on file  . Years of education: Not on file  . Highest education level: Not on file  Occupational History  . Occupation: retired    Scientific laboratory technician  . Financial resource strain: Not hard at all  . Food insecurity:    Worry: Never true    Inability: Never true  . Transportation needs:    Medical: No    Non-medical: No  Tobacco Use  . Smoking status: Former Smoker    Packs/day: 0.25    Years: 30.00    Pack years: 7.50    Types: Cigarettes    Last attempt to quit: 07/01/2017    Years since quitting: 0.8  . Smokeless tobacco: Never Used  Substance and Sexual Activity  . Alcohol use: Yes    Comment: rare, glass of wine occasionally  . Drug use: No  . Sexual activity: Never  Lifestyle  . Physical activity:    Days per week: 5 days    Minutes per session: 30 min  . Stress: Not at all  Relationships  . Social connections:    Talks on phone: Not on file    Gets together: Not on file    Attends religious service: Not on file    Active member of club or organization: Not on file    Attends meetings of clubs or organizations: Not on file    Relationship status: Not on file  . Intimate partner violence:    Fear of current or ex partner: Not on file    Emotionally abused: Not on file    Physically  abused: Not on file    Forced sexual activity: Not on file  Other Topics Concern  . Not on file  Social History Narrative  . Not on file   Family History  Problem Relation Age of Onset  . Stroke Mother   . Cancer Sister   . Parkinsonism Brother       VITAL SIGNS BP (!) 160/99   Pulse 100   Temp 98.5 F (36.9 C)   Resp 14   Ht 5\' 1"  (1.549 m)   Wt 96 lb 11.2 oz (43.9 kg)   SpO2 97%   BMI 18.27 kg/m   Outpatient Encounter Medications as of 04/24/2018  Medication Sig  . acetaminophen (TYLENOL) 325 MG tablet Take 650 mg by mouth every 4 (four) hours as needed. for pain/ increased temp. May be administered orally, per G-tube if needed or rectally if unable to swallow (separate order). Maximum dose for 24 hours is 3,000 mg from all sources of Acetaminophen/ Tylenol  . albuterol (PROVENTIL HFA;VENTOLIN HFA)  108 (90 BASE) MCG/ACT inhaler Inhale 2 puffs into the lungs every 6 (six) hours as needed for wheezing or shortness of breath.  Marland Kitchen alum & mag hydroxide-simeth (MAALOX/MYLANTA) 200-200-20 MG/5ML suspension Take 30 mLs by mouth every 4 (four) hours as needed for indigestion.  . Amino Acids-Protein Hydrolys (FEEDING SUPPLEMENT, PRO-STAT SUGAR FREE 64,) LIQD Take 30 mLs by mouth 2 (two) times daily between meals.  Marland Kitchen aspirin EC 81 MG tablet Take 81 mg by mouth daily.   . bisacodyl (DULCOLAX) 10 MG suppository Place 10 mg rectally daily as needed.  Marland Kitchen buPROPion (WELLBUTRIN) 75 MG tablet Take 75 mg by mouth daily.   . Cholecalciferol (VITAMIN D) 2000 units CAPS Take 1 capsule by mouth daily.  Marland Kitchen docusate sodium (COLACE) 100 MG capsule Take 1 capsule (100 mg total) by mouth 2 (two) times daily.  . feeding supplement, ENSURE ENLIVE, (ENSURE ENLIVE) LIQD Take 237 mLs by mouth 2 (two) times daily. give with a meal for nutrition  . geriatric multivitamins-minerals (ELDERTONIC/GEVRABON) ELIX Take 15 mLs by mouth 3 (three) times daily with meals.   Marland Kitchen guaifenesin (ROBITUSSIN) 100 MG/5ML syrup Take 200 mg by mouth every 4 (four) hours as needed for cough.  . Infant Care Products (DERMACLOUD) CREA Apply liberal amount topically to area of skin irritation daily as needed. OK to leave at bedside.  . levalbuterol (XOPENEX) 1.25 MG/3ML nebulizer solution Take 1.25 mg by nebulization every 6 (six) hours as needed for wheezing.  Marland Kitchen levothyroxine (SYNTHROID, LEVOTHROID) 75 MCG tablet Take 1 tablet (75 mcg total) by mouth daily before breakfast.  . nitroGLYCERIN (NITROSTAT) 0.4 MG SL tablet Place 0.4 mg under the tongue every 5 (five) minutes as needed for chest pain.  . Nutritional Supplements (NUTRITIONAL SUPPLEMENT PO) Diet Type: Heart Healthy- NO added salt  . ondansetron (ZOFRAN) 4 MG tablet Take 4 mg by mouth every 8 (eight) hours as needed for nausea or vomiting.  . OXYGEN Inhale 2 L into the lungs continuous as needed.    . pantoprazole (PROTONIX) 40 MG tablet Take 1 tablet (40 mg total) by mouth 2 (two) times daily.  . polyethylene glycol (MIRALAX / GLYCOLAX) packet Take 17 g by mouth daily.  . sertraline (ZOLOFT) 50 MG tablet Take 50 mg by mouth daily.   . Skin Protectants, Misc. (ENDIT EX) Apply topically. Apply 1 application topically  PRN to peri-area/buttocks  . sodium chloride (OCEAN) 0.65 % nasal spray Place 2 sprays into the nose as  needed for congestion.  Marland Kitchen Umeclidinium-Vilanterol (ANORO ELLIPTA) 62.5-25 MCG/INH AEPB Inhale 1 puff into the lungs daily.   . [DISCONTINUED] dronabinol (MARINOL) 2.5 MG capsule Take 1 capsule (2.5 mg total) by mouth daily before lunch. (Patient not taking: Reported on 04/24/2018)   No facility-administered encounter medications on file as of 04/24/2018.      SIGNIFICANT DIAGNOSTIC EXAMS  LABS REVIEWED:   08-17-17: chol 239; ldl 155; trig 151; hdl 54; tsh 2.837; mag 2.2; vit D 25.4 08-31-17: wbc 8.2; hgb 10.7; hct 31.9; mcv 90.2; plt 323 glucose 165; bun 46; creat 1.72; k+ 3.5 na++136 ca 9.3 liver normal albumin 3.3 03-06-18: wbc  7.1; hgb 13.4; hct 38.3; mcv 89.5; plt 180; glucose 98; bun 36; creat 1.62; k+ 3.5; na++ 140; ca 9.4 liver normal albumin 3.6   NO NEW LABS.   Review of Systems  Constitutional: Negative for malaise/fatigue.  HENT: Positive for sore throat.   Respiratory: Positive for cough. Negative for shortness of breath.   Cardiovascular: Negative for chest pain, palpitations and leg swelling.  Gastrointestinal: Negative for abdominal pain, constipation and heartburn.  Musculoskeletal: Negative for back pain, joint pain and myalgias.  Skin: Negative.   Neurological: Negative for dizziness.  Psychiatric/Behavioral: The patient is not nervous/anxious.     Physical Exam  Constitutional: She appears well-developed and well-nourished. No distress.  HENT:  Mouth/Throat: Oropharynx is clear and moist.  Neck: No thyromegaly present.  Cardiovascular:  Normal rate, regular rhythm, normal heart sounds and intact distal pulses.  Pulmonary/Chest: Effort normal. No respiratory distress. She has wheezes.  Few scattered   Abdominal: Soft. Bowel sounds are normal. She exhibits no distension. There is no tenderness.  Musculoskeletal: She exhibits no edema.  Is able to move all extremities   Lymphadenopathy:    She has no cervical adenopathy.  Neurological: She is alert.  Skin: Skin is warm and dry. She is not diaphoretic.  Psychiatric: She has a normal mood and affect.      ASSESSMENT/ PLAN:  TODAY:   1. Chronic obstructive pulmonary disease with acte exacerbation: is worse: will stop the albuterol inhaler and will begin xopenex 1.25 mg neb every 6 hours through 05-03-18 then return to every 6 hours as needed   MD is aware of resident's narcotic use and is in agreement with current plan of care. We will attempt to wean resident as apropriate   Ok Edwards NP [facility]  Contact [phone] Monday through Friday 8am- 5pm  After hours call [phone]

## 2018-04-25 ENCOUNTER — Encounter: Payer: Self-pay | Admitting: Adult Health

## 2018-04-25 NOTE — Progress Notes (Signed)
[facility]   [facility] Room Number: [place] of Service:  SNF (31)   CODE STATUS: Full Code  Allergies  Allergen Reactions  . Hydrocodone Other (See Comments)  . Oxycodone Hives    Chief Complaint  Patient presents with  . Acute Visit    Hypertension    HPI:    Past Medical History:  Diagnosis Date  . Anxiety    unspecified  . Atrial fibrillation ([place])   . COPD (chronic obstructive pulmonary disease) ([place]) 10/05/2013  . Coronary artery disease 10/05/2013  . Dementia ([place])   . Depression   . Diastolic heart failure ([place])   . Hyperlipidemia, unspecified   . Hypertension 10/05/2013  . Hypothyroidism   . Hypothyroidism, acquired    unspecified  . PVD (peripheral vascular disease) ([place]) 10/05/2013   iliac stents    Past Surgical History:  Procedure Laterality Date  . ABDOMINAL HYSTERECTOMY    . CARDIAC SURGERY    . CARDIOVERSION    . CATARACT EXTRACTION Bilateral 2012  . CORONARY ARTERY BYPASS GRAFT  1985   x3  . ESOPHAGOGASTRODUODENOSCOPY N/A 07/09/2017   Procedure: ESOPHAGOGASTRODUODENOSCOPY (EGD);  Surgeon: Lin Landsman, MD;  Location: [facility] ENDOSCOPY;  Service: Gastroenterology;  Laterality: N/A;  . [place]   patient unsure of which side  . HIP PINNING,CANNULATED Right 05/19/2015   Procedure: CANNULATED HIP PINNING;  Surgeon: Hessie Knows, MD;  Location: [facility] ORS;  Service: Orthopedics;  Laterality: Right;    Social History   Socioeconomic History  . Marital status: Divorced    Spouse name: Not on file  . Number of children: Not on file  . Years of education: Not on file  . Highest education level: Not on file  Occupational History  . Occupation: retired  Scientific laboratory technician  . Financial resource strain: Not hard at all  . Food insecurity:    Worry: Never true    Inability: Never true  . Transportation needs:    Medical: No    Non-medical: No  Tobacco Use  . Smoking status: Former Smoker   Packs/day: 0.25    Years: 30.00    Pack years: 7.50    Types: Cigarettes    Last attempt to quit: 07/01/2017    Years since quitting: 0.8  . Smokeless tobacco: Never Used  Substance and Sexual Activity  . Alcohol use: Yes    Comment: rare, glass of wine occasionally  . Drug use: No  . Sexual activity: Never  Lifestyle  . Physical activity:    Days per week: 5 days    Minutes per session: 30 min  . Stress: Not at all  Relationships  . Social connections:    Talks on phone: Not on file    Gets together: Not on file    Attends religious service: Not on file    Active member of club or organization: Not on file    Attends meetings of clubs or organizations: Not on file    Relationship status: Not on file  . Intimate partner violence:    Fear of current or ex partner: Not on file    Emotionally abused: Not on file    Physically abused: Not on file    Forced sexual activity: Not on file  Other Topics Concern  . Not on file  Social History Narrative  . Not on file   Family History  Problem Relation Age of Onset  . Stroke Mother   .  Cancer Sister   . Parkinsonism Brother       VITAL SIGNS BP (!) 160/99   Pulse 100   Temp 98.5 F (36.9 C)   Resp 14   Ht 5\' 1"  (1.549 m)   Wt 96 lb 11.2 oz (43.9 kg)   SpO2 97%   BMI 18.27 kg/m   Outpatient Encounter Medications as of 04/25/2018  Medication Sig  . Amino Acids-Protein Hydrolys (FEEDING SUPPLEMENT, PRO-STAT SUGAR FREE 64,) LIQD Take 30 mLs by mouth 2 (two) times daily between meals.  Marland Kitchen aspirin EC 81 MG tablet Take 81 mg by mouth daily.   Marland Kitchen buPROPion (WELLBUTRIN) 75 MG tablet Take 75 mg by mouth daily.   . Cholecalciferol (VITAMIN D) 2000 units CAPS Take 1 capsule by mouth daily.  Marland Kitchen docusate sodium (COLACE) 100 MG capsule Take 1 capsule (100 mg total) by mouth 2 (two) times daily.  . feeding supplement, ENSURE ENLIVE, (ENSURE ENLIVE) LIQD Take 237 mLs by mouth 2 (two) times daily. give with a meal for nutrition  .  geriatric multivitamins-minerals (ELDERTONIC/GEVRABON) ELIX Take 15 mLs by mouth 3 (three) times daily with meals.   Marland Kitchen guaifenesin (ROBITUSSIN) 100 MG/5ML syrup Take 200 mg by mouth every 4 (four) hours as needed for cough.  . levalbuterol (XOPENEX) 1.25 MG/3ML nebulizer solution Take 1.25 mg by nebulization every 6 (six) hours as needed for wheezing.  Marland Kitchen levothyroxine (SYNTHROID, LEVOTHROID) 75 MCG tablet Take 1 tablet (75 mcg total) by mouth daily before breakfast.  . nitroGLYCERIN (NITROSTAT) 0.4 MG SL tablet Place 0.4 mg under the tongue every 5 (five) minutes as needed for chest pain.  . Nutritional Supplements (NUTRITIONAL SUPPLEMENT PO) Diet Type: Heart Healthy- NO added salt  . OXYGEN Inhale 2 L into the lungs continuous as needed.   . pantoprazole (PROTONIX) 40 MG tablet Take 1 tablet (40 mg total) by mouth 2 (two) times daily.  . sertraline (ZOLOFT) 50 MG tablet Take 50 mg by mouth daily.   . Skin Protectants, Misc. (ENDIT EX) Apply topically. Apply 1 application topically  PRN to peri-area/buttocks  . sodium chloride (OCEAN) 0.65 % nasal spray Place 2 sprays into the nose as needed for congestion.  Marland Kitchen Umeclidinium-Vilanterol (ANORO ELLIPTA) 62.5-25 MCG/INH AEPB Inhale 1 puff into the lungs daily.   Marland Kitchen acetaminophen (TYLENOL) 325 MG tablet Take 650 mg by mouth every 4 (four) hours as needed. for pain/ increased temp. May be administered orally, per G-tube if needed or rectally if unable to swallow (separate order). Maximum dose for 24 hours is 3,000 mg from all sources of Acetaminophen/ Tylenol  . albuterol (PROVENTIL HFA;VENTOLIN HFA) 108 (90 BASE) MCG/ACT inhaler Inhale 2 puffs into the lungs every 6 (six) hours as needed for wheezing or shortness of breath.  Marland Kitchen alum & mag hydroxide-simeth (MAALOX/MYLANTA) 200-200-20 MG/5ML suspension Take 30 mLs by mouth every 4 (four) hours as needed for indigestion.  . bisacodyl (DULCOLAX) 10 MG suppository Place 10 mg rectally daily as needed.  . Infant  Care Products (DERMACLOUD) CREA Apply liberal amount topically to area of skin irritation daily as needed. OK to leave at bedside.  . [DISCONTINUED] ondansetron (ZOFRAN) 4 MG tablet Take 4 mg by mouth every 8 (eight) hours as needed for nausea or vomiting.  . [DISCONTINUED] polyethylene glycol (MIRALAX / GLYCOLAX) packet Take 17 g by mouth daily.   No facility-administered encounter medications on file as of 04/25/2018.      SIGNIFICANT DIAGNOSTIC EXAMS       ASSESSMENT/ PLAN:  MD is aware of resident's narcotic use and is in agreement with current plan of care. We will attempt to wean resident as apropriate   Deborah Green NP [facility]  Contact [phone] Monday through Friday 8am- 5pm  After hours call [phone]  

## 2018-04-26 ENCOUNTER — Other Ambulatory Visit
Admission: RE | Admit: 2018-04-26 | Discharge: 2018-04-26 | Disposition: A | Discharge status: Home / Self Care | Payer: Medicare HMO | Source: Ambulatory Visit | Attending: Internal Medicine | Admitting: Internal Medicine

## 2018-04-26 DIAGNOSIS — E559 Vitamin D deficiency, unspecified: Secondary | ICD-10-CM | POA: Diagnosis not present

## 2018-04-27 LAB — VITAMIN D 25 HYDROXY (VIT D DEFICIENCY, FRACTURES): Vit D, 25-Hydroxy: 45.9 ng/mL (ref 30.0–100.0)

## 2018-04-29 NOTE — Progress Notes (Signed)
This encounter was created in error - please disregard.

## 2018-05-06 ENCOUNTER — Encounter
Admission: RE | Admit: 2018-05-06 | Discharge: 2018-05-06 | Disposition: A | Discharge status: Home / Self Care | Payer: Medicare HMO | Source: Ambulatory Visit | Attending: Internal Medicine | Admitting: Internal Medicine

## 2018-05-06 DIAGNOSIS — D649 Anemia, unspecified: Secondary | ICD-10-CM | POA: Insufficient documentation

## 2018-05-06 DIAGNOSIS — E559 Vitamin D deficiency, unspecified: Secondary | ICD-10-CM | POA: Insufficient documentation

## 2018-05-07 ENCOUNTER — Encounter: Payer: Self-pay | Admitting: Adult Health

## 2018-05-07 ENCOUNTER — Non-Acute Institutional Stay (SKILLED_NURSING_FACILITY): Payer: Medicare HMO | Admitting: Adult Health

## 2018-05-07 DIAGNOSIS — F0150 Vascular dementia without behavioral disturbance: Secondary | ICD-10-CM

## 2018-05-07 DIAGNOSIS — F418 Other specified anxiety disorders: Secondary | ICD-10-CM | POA: Diagnosis not present

## 2018-05-07 NOTE — Progress Notes (Signed)
[facility]   [facility] Room Number: [place] of Service:  SNF (31)   CODE STATUS: Full Code  Allergies  Allergen Reactions  . Hydrocodone Other (See Comments)  . Oxycodone Hives    Chief Complaint  Patient presents with  . Acute Visit    Decreased appetite    HPI:  She is complaining of increased depression and anxiety. She states that she wants to go home. Her appetite is worse stating that she will eat better once she goes home. She denies any insomnia.   Past Medical History:  Diagnosis Date  . Anxiety    unspecified  . Atrial fibrillation ([place])   . COPD (chronic obstructive pulmonary disease) ([place]) 10/05/2013  . Coronary artery disease 10/05/2013  . Dementia ([place])   . Depression   . Diastolic heart failure ([place])   . Hyperlipidemia, unspecified   . Hypertension 10/05/2013  . Hypothyroidism   . Hypothyroidism, acquired    unspecified  . PVD (peripheral vascular disease) ([place]) 10/05/2013   iliac stents    Past Surgical History:  Procedure Laterality Date  . ABDOMINAL HYSTERECTOMY    . CARDIAC SURGERY    . CARDIOVERSION    . CATARACT EXTRACTION Bilateral 2012  . CORONARY ARTERY BYPASS GRAFT  1985   x3  . ESOPHAGOGASTRODUODENOSCOPY N/A 07/09/2017   Procedure: ESOPHAGOGASTRODUODENOSCOPY (EGD);  Surgeon: Lin Landsman, MD;  Location: [facility] ENDOSCOPY;  Service: Gastroenterology;  Laterality: N/A;  . [place]   patient unsure of which side  . HIP PINNING,CANNULATED Right 05/19/2015   Procedure: CANNULATED HIP PINNING;  Surgeon: Hessie Knows, MD;  Location: [facility] ORS;  Service: Orthopedics;  Laterality: Right;    Social History   Socioeconomic History  . Marital status: Divorced    Spouse name: Not on file  . Number of children: Not on file  . Years of education: Not on file  . Highest education level: Not on file  Occupational History  . Occupation: retired  Scientific laboratory technician  . Financial resource strain:  Not hard at all  . Food insecurity:    Worry: Never true    Inability: Never true  . Transportation needs:    Medical: No    Non-medical: No  Tobacco Use  . Smoking status: Former Smoker    Packs/day: 0.25    Years: 30.00    Pack years: 7.50    Types: Cigarettes    Last attempt to quit: 07/01/2017    Years since quitting: 0.8  . Smokeless tobacco: Never Used  Substance and Sexual Activity  . Alcohol use: Yes    Comment: rare, glass of wine occasionally  . Drug use: No  . Sexual activity: Never  Lifestyle  . Physical activity:    Days per week: 5 days    Minutes per session: 30 min  . Stress: Not at all  Relationships  . Social connections:    Talks on phone: Not on file    Gets together: Not on file    Attends religious service: Not on file    Active member of club or organization: Not on file    Attends meetings of clubs or organizations: Not on file    Relationship status: Not on file  . Intimate partner violence:    Fear of current or ex partner: Not on file    Emotionally abused: Not on file    Physically abused: Not on file    Forced sexual activity:  Not on file  Other Topics Concern  . Not on file  Social History Narrative  . Not on file   Family History  Problem Relation Age of Onset  . Stroke Mother   . Cancer Sister   . Parkinsonism Brother       VITAL SIGNS BP (!) 137/97   Pulse 78   Temp 98 F (36.7 C)   Resp 16   Ht 5\' 1"  (1.549 m)   Wt 96 lb 11.2 oz (43.9 kg)   SpO2 96%   BMI 18.27 kg/m   Outpatient Encounter Medications as of 05/07/2018  Medication Sig  . Amino Acids-Protein Hydrolys (FEEDING SUPPLEMENT, PRO-STAT SUGAR FREE 64,) LIQD Take 30 mLs by mouth 2 (two) times daily between meals.  Marland Kitchen aspirin EC 81 MG tablet Take 81 mg by mouth daily.   Marland Kitchen buPROPion (WELLBUTRIN) 75 MG tablet Take 75 mg by mouth daily.   Marland Kitchen docusate sodium (COLACE) 100 MG capsule Take 1 capsule (100 mg total) by mouth 2 (two) times daily.  . feeding supplement,  ENSURE ENLIVE, (ENSURE ENLIVE) LIQD Take 237 mLs by mouth 2 (two) times daily. give with a meal for nutrition  . levalbuterol (XOPENEX) 1.25 MG/3ML nebulizer solution Take 1.25 mg by nebulization every 6 (six) hours as needed for wheezing.  Marland Kitchen levothyroxine (SYNTHROID, LEVOTHROID) 75 MCG tablet Take 1 tablet (75 mcg total) by mouth daily before breakfast.  . nitroGLYCERIN (NITROSTAT) 0.4 MG SL tablet Place 0.4 mg under the tongue every 5 (five) minutes as needed for chest pain.  . Nutritional Supplements (NUTRITIONAL SUPPLEMENT PO) Diet Type: Heart Healthy- NO added salt  . ondansetron (ZOFRAN) 4 MG tablet Take 4 mg by mouth every 6 (six) hours as needed for nausea or vomiting.  . OXYGEN Inhale 2 L into the lungs continuous as needed.   . pantoprazole (PROTONIX) 40 MG tablet Take 1 tablet (40 mg total) by mouth 2 (two) times daily.  . polyethylene glycol (MIRALAX / GLYCOLAX) packet Take 17 g by mouth daily.  . sertraline (ZOLOFT) 50 MG tablet Take 50 mg by mouth daily.   . Skin Protectants, Misc. (ENDIT EX) Apply topically. Apply 1 application topically  PRN to peri-area/buttocks  . sodium chloride (OCEAN) 0.65 % nasal spray Place 2 sprays into the nose as needed for congestion.  Marland Kitchen Umeclidinium-Vilanterol (ANORO ELLIPTA) 62.5-25 MCG/INH AEPB Inhale 1 puff into the lungs daily.   . [DISCONTINUED] acetaminophen (TYLENOL) 325 MG tablet Take 650 mg by mouth every 4 (four) hours as needed. for pain/ increased temp. May be administered orally, per G-tube if needed or rectally if unable to swallow (separate order). Maximum dose for 24 hours is 3,000 mg from all sources of Acetaminophen/ Tylenol  . [DISCONTINUED] albuterol (PROVENTIL HFA;VENTOLIN HFA) 108 (90 BASE) MCG/ACT inhaler Inhale 2 puffs into the lungs every 6 (six) hours as needed for wheezing or shortness of breath. (Patient not taking: Reported on 05/07/2018)  . [DISCONTINUED] alum & mag hydroxide-simeth (MAALOX/MYLANTA) 200-200-20 MG/5ML suspension  Take 30 mLs by mouth every 4 (four) hours as needed for indigestion. (Patient not taking: Reported on 05/07/2018)  . [DISCONTINUED] bisacodyl (DULCOLAX) 10 MG suppository Place 10 mg rectally daily as needed.  . [DISCONTINUED] Cholecalciferol (VITAMIN D) 2000 units CAPS Take 1 capsule by mouth daily.  . [DISCONTINUED] geriatric multivitamins-minerals (ELDERTONIC/GEVRABON) ELIX Take 15 mLs by mouth 3 (three) times daily with meals.   . [place] (DERMACLOUD) CREA Apply liberal amount topically to area of skin irritation  daily as needed. OK to leave at bedside.   No facility-administered encounter medications on file as of 05/07/2018.      SIGNIFICANT DIAGNOSTIC EXAMS  LABS REVIEWED:   08-17-17: chol 239; ldl 155; trig 151; hdl 54; tsh 2.837; mag 2.2; vit D 25.4 08-31-17: wbc 8.2; hgb 10.7; hct 31.9; mcv 90.2; plt 323 glucose 165; bun 46; creat 1.72; k+ 3.5 na++136 ca 9.3 liver normal albumin 3.3 03-06-18: wbc  7.1; hgb 13.4; hct 38.3; mcv 89.5; plt 180; glucose 98; bun 36; creat 1.62; k+ 3.5; na++ 140; ca 9.4 liver normal albumin 3.6   TODAY:   04-26-18: vit D 45.9    Review of Systems  Constitutional: Negative for malaise/fatigue.       Poor appetite   Respiratory: Negative for cough and shortness of breath.   Cardiovascular: Negative for chest pain, palpitations and leg swelling.  Gastrointestinal: Negative for abdominal pain, constipation and heartburn.  Musculoskeletal: Negative for back pain, joint pain and myalgias.  Skin: Negative.   Neurological: Negative for dizziness.  Psychiatric/Behavioral: Positive for depression. The patient is not nervous/anxious.        Wants to go home    Physical Exam  Constitutional: She appears well-developed and well-nourished. No distress.  Neck: No thyromegaly present.  Cardiovascular: Normal rate, regular rhythm, normal heart sounds and intact distal pulses.  Pulmonary/Chest: Effort normal and breath sounds normal. No  respiratory distress.  Abdominal: Soft. Bowel sounds are normal. She exhibits no distension. There is no tenderness.  Musculoskeletal: Normal range of motion. She exhibits no edema.  Lymphadenopathy:    She has no cervical adenopathy.  Neurological: She is alert.  Skin: Skin is warm and dry. She is not diaphoretic.  Psychiatric: She has a normal mood and affect.  Has depressed affect         ASSESSMENT/ PLAN:  TODAY:   1. Vascular dementia without behavioral disturbance 2. Depression with anxiety  Will increase her zoloft to 100 mg daily and will monitor     MD is aware of resident's narcotic use and is in agreement with current plan of care. We will attempt to wean resident as apropriate   Ok Edwards NP [facility]  Contact [phone] Monday through Friday 8am- 5pm  After hours call [phone]

## 2018-05-11 ENCOUNTER — Encounter: Payer: Self-pay | Admitting: Adult Health

## 2018-05-11 ENCOUNTER — Non-Acute Institutional Stay (SKILLED_NURSING_FACILITY): Payer: Medicare HMO | Admitting: Adult Health

## 2018-05-11 DIAGNOSIS — K449 Diaphragmatic hernia without obstruction or gangrene: Secondary | ICD-10-CM

## 2018-05-11 DIAGNOSIS — K2210 Ulcer of esophagus without bleeding: Secondary | ICD-10-CM | POA: Diagnosis not present

## 2018-05-11 DIAGNOSIS — K5909 Other constipation: Secondary | ICD-10-CM

## 2018-05-11 DIAGNOSIS — E034 Atrophy of thyroid (acquired): Secondary | ICD-10-CM | POA: Diagnosis not present

## 2018-05-11 DIAGNOSIS — I480 Paroxysmal atrial fibrillation: Secondary | ICD-10-CM

## 2018-05-11 NOTE — Progress Notes (Signed)
[facility] Room Number: [place] of Service:  SNF (31)   CODE STATUS: Full Code  Allergies  Allergen Reactions  . Hydrocodone Other (See Comments)  . Oxycodone Hives    Chief Complaint  Patient presents with  . Medical Management of Chronic Issues    Paroxysmal atrial fibrillation chronic constipation; large hiatal hernia; esophagitis erosive; hypothyroidism due to acquired atrophy of thyroid    HPI:  She is a 84 year old long term resident of this facility being seen for the management of her chronic illnesses: afib; constipation; hiatal hernia; esophagitis; hypothyroidism. She denies any heart burn; constipation; no changes in appetite.   Past Medical History:  Diagnosis Date  . Anxiety    unspecified  . Atrial fibrillation ([place])   . COPD (chronic obstructive pulmonary disease) ([place]) 10/05/2013  . Coronary artery disease 10/05/2013  . Dementia ([place])   . Depression   . Diastolic heart failure ([place])   . Hyperlipidemia, unspecified   . Hypertension 10/05/2013  . Hypothyroidism   . Hypothyroidism, acquired    unspecified  . PVD (peripheral vascular disease) ([place]) 10/05/2013   iliac stents    Past Surgical History:  Procedure Laterality Date  . ABDOMINAL HYSTERECTOMY    . CARDIAC SURGERY    . CARDIOVERSION    . CATARACT EXTRACTION Bilateral 2012  . CORONARY ARTERY BYPASS GRAFT  1985   x3  . ESOPHAGOGASTRODUODENOSCOPY N/A 07/09/2017   Procedure: ESOPHAGOGASTRODUODENOSCOPY (EGD);  Surgeon: Lin Landsman, MD;  Location: [facility] ENDOSCOPY;  Service: Gastroenterology;  Laterality: N/A;  . [place]   patient unsure of which side  . HIP PINNING,CANNULATED Right 05/19/2015   Procedure: CANNULATED HIP PINNING;  Surgeon: Hessie Knows, MD;  Location: [facility] ORS;  Service: Orthopedics;  Laterality: Right;    Social History   Socioeconomic History  . Marital status: Divorced    Spouse name: Not on file  .  Number of children: Not on file  . Years of education: Not on file  . Highest education level: Not on file  Occupational History  . Occupation: retired  Scientific laboratory technician  . Financial resource strain: Not hard at all  . Food insecurity:    Worry: Never true    Inability: Never true  . Transportation needs:    Medical: No    Non-medical: No  Tobacco Use  . Smoking status: Former Smoker    Packs/day: 0.25    Years: 30.00    Pack years: 7.50    Types: Cigarettes    Last attempt to quit: 07/01/2017    Years since quitting: 0.8  . Smokeless tobacco: Never Used  Substance and Sexual Activity  . Alcohol use: Yes    Comment: rare, glass of wine occasionally  . Drug use: No  . Sexual activity: Never  Lifestyle  . Physical activity:    Days per week: 5 days    Minutes per session: 30 min  . Stress: Not at all  Relationships  . Social connections:    Talks on phone: Not on file    Gets together: Not on file    Attends religious service: Not on file    Active member of club or organization: Not on file    Attends meetings of clubs or organizations: Not on file    Relationship status: Not on file  . Intimate partner violence:    Fear of current or ex partner: Not on file  Emotionally abused: Not on file    Physically abused: Not on file    Forced sexual activity: Not on file  Other Topics Concern  . Not on file  Social History Narrative  . Not on file   Family History  Problem Relation Age of Onset  . Stroke Mother   . Cancer Sister   . Parkinsonism Brother       VITAL SIGNS BP (!) 137/97   Pulse 78   Temp 98 F (36.7 C)   Resp 16   Ht 5\' 1"  (1.549 m)   Wt 96 lb 11.2 oz (43.9 kg)   SpO2 96%   BMI 18.27 kg/m   Outpatient Encounter Medications as of 05/11/2018  Medication Sig  . Amino Acids-Protein Hydrolys (FEEDING SUPPLEMENT, PRO-STAT SUGAR FREE 64,) LIQD Take 30 mLs by mouth 2 (two) times daily between meals.  Marland Kitchen aspirin EC 81 MG tablet Take 81 mg by mouth  daily.   Marland Kitchen buPROPion (WELLBUTRIN) 75 MG tablet Take 75 mg by mouth daily.   Marland Kitchen docusate sodium (COLACE) 100 MG capsule Take 1 capsule (100 mg total) by mouth 2 (two) times daily.  . feeding supplement, ENSURE ENLIVE, (ENSURE ENLIVE) LIQD Take 237 mLs by mouth 2 (two) times daily. give with a meal for nutrition  . levalbuterol (XOPENEX) 1.25 MG/3ML nebulizer solution Take 1.25 mg by nebulization every 6 (six) hours as needed for wheezing.  Marland Kitchen levothyroxine (SYNTHROID, LEVOTHROID) 75 MCG tablet Take 1 tablet (75 mcg total) by mouth daily before breakfast.  . nitroGLYCERIN (NITROSTAT) 0.4 MG SL tablet Place 0.4 mg under the tongue every 5 (five) minutes as needed for chest pain.  . Nutritional Supplements (NUTRITIONAL SUPPLEMENT PO) Diet Type: Heart Healthy- NO added salt  . OXYGEN Inhale 2 L into the lungs continuous as needed.   . pantoprazole (PROTONIX) 40 MG tablet Take 1 tablet (40 mg total) by mouth 2 (two) times daily.  . polyethylene glycol (MIRALAX / GLYCOLAX) packet Take 17 g by mouth daily.  . sertraline (ZOLOFT) 100 MG tablet Take 100 mg by mouth daily.   . Skin Protectants, Misc. (ENDIT EX) Apply topically. Apply 1 application topically  PRN to peri-area/buttocks  . sodium chloride (OCEAN) 0.65 % nasal spray Place 2 sprays into the nose as needed for congestion.  Marland Kitchen Umeclidinium-Vilanterol (ANORO ELLIPTA) 62.5-25 MCG/INH AEPB Inhale 1 puff into the lungs daily.    No facility-administered encounter medications on file as of 05/11/2018.      SIGNIFICANT DIAGNOSTIC EXAMS  LABS REVIEWED:   08-17-17: chol 239; ldl 155; trig 151; hdl 54; tsh 2.837; mag 2.2; vit D 25.4 08-31-17: wbc 8.2; hgb 10.7; hct 31.9; mcv 90.2; plt 323 glucose 165; bun 46; creat 1.72; k+ 3.5 na++136 ca 9.3 liver normal albumin 3.3 03-06-18: wbc  7.1; hgb 13.4; hct 38.3; mcv 89.5; plt 180; glucose 98; bun 36; creat 1.62; k+ 3.5; na++ 140; ca 9.4 liver normal albumin 3.6 04-26-18: vit D 45.9  NO NEW LABS.     Review  of Systems  Constitutional: Negative for malaise/fatigue.  Respiratory: Negative for cough and shortness of breath.   Cardiovascular: Negative for chest pain, palpitations and leg swelling.  Gastrointestinal: Negative for abdominal pain, constipation and heartburn.  Musculoskeletal: Negative for back pain, joint pain and myalgias.  Skin: Negative.   Neurological: Negative for dizziness.  Psychiatric/Behavioral: The patient is not nervous/anxious.        Wants to go home     Physical Exam  Constitutional:  She appears well-developed and well-nourished. No distress.  Neck: No thyromegaly present.  Cardiovascular: Normal rate, regular rhythm, normal heart sounds and intact distal pulses.  Pulmonary/Chest: Effort normal and breath sounds normal. No respiratory distress.  Abdominal: Soft. Bowel sounds are normal. She exhibits no distension. There is no tenderness.  Musculoskeletal: She exhibits no edema.  Is able to move all extremities   Lymphadenopathy:    She has no cervical adenopathy.  Neurological: She is alert.  Skin: Skin is warm and dry. She is not diaphoretic.  Psychiatric: She has a normal mood and affect.        ASSESSMENT/ PLAN:  TODAY:   1. Large hiatal hernia/esophagitis, erosive: is stable will continue protonix 40 mg twice daily   2, hypothyroidism due to acquired atrophy of thyroid: stable tsh 2.837; will continue synthroid 75 mcg daily  3. Chronic constipation: is stable will continue colace twice daily   4. Paroxymal atrial fibrillation: heart rate is controlled; will continue asa 81 mg daily has prn ntg for chest pain.   PREVIOUS   5. Chronic diastolic heart failure: is stable will continue asa 81 mg daily will continue to monitor  6. COPD: is stable is 02 dependent; will continue  xopenex 1.25 neb treatment  every 6 hours as needed; and anoro 62.5/25 mcg 1 puff daily   7.  CAD is status post CABG X3: is stable will continue asa 81 mg daily has prn  ntg  8. Failure to thrive: is without change weight is 96 (previous 98) pounds; albumin is 3.6 will continue supplements as indicated   9. Iron deficiency anemia due to chronic blood loss: stable hgb 13.4; will monitor  10. Depression with anxiety: is stable no excessive depression will continue wellbutrin 75 mg daily and zoloft 100 mg daily   11. Vascular dementia: no change in status; weight is 96(previous98) pounds; will not make changes will monitor     MD is aware of resident's narcotic use and is in agreement with current plan of care. We will attempt to wean resident as apropriate   Ok Edwards NP [facility]  Contact [phone] Monday through Friday 8am- 5pm  After hours call [phone]

## 2018-05-24 IMAGING — DX DG CHEST 1V PORT
1 series · 1 of 1 positions shown · non-contrast
Comparison: 05/19/2015

CLINICAL DATA: Respiratory distress

EXAM:
PORTABLE CHEST 1 VIEW

[chest ap]
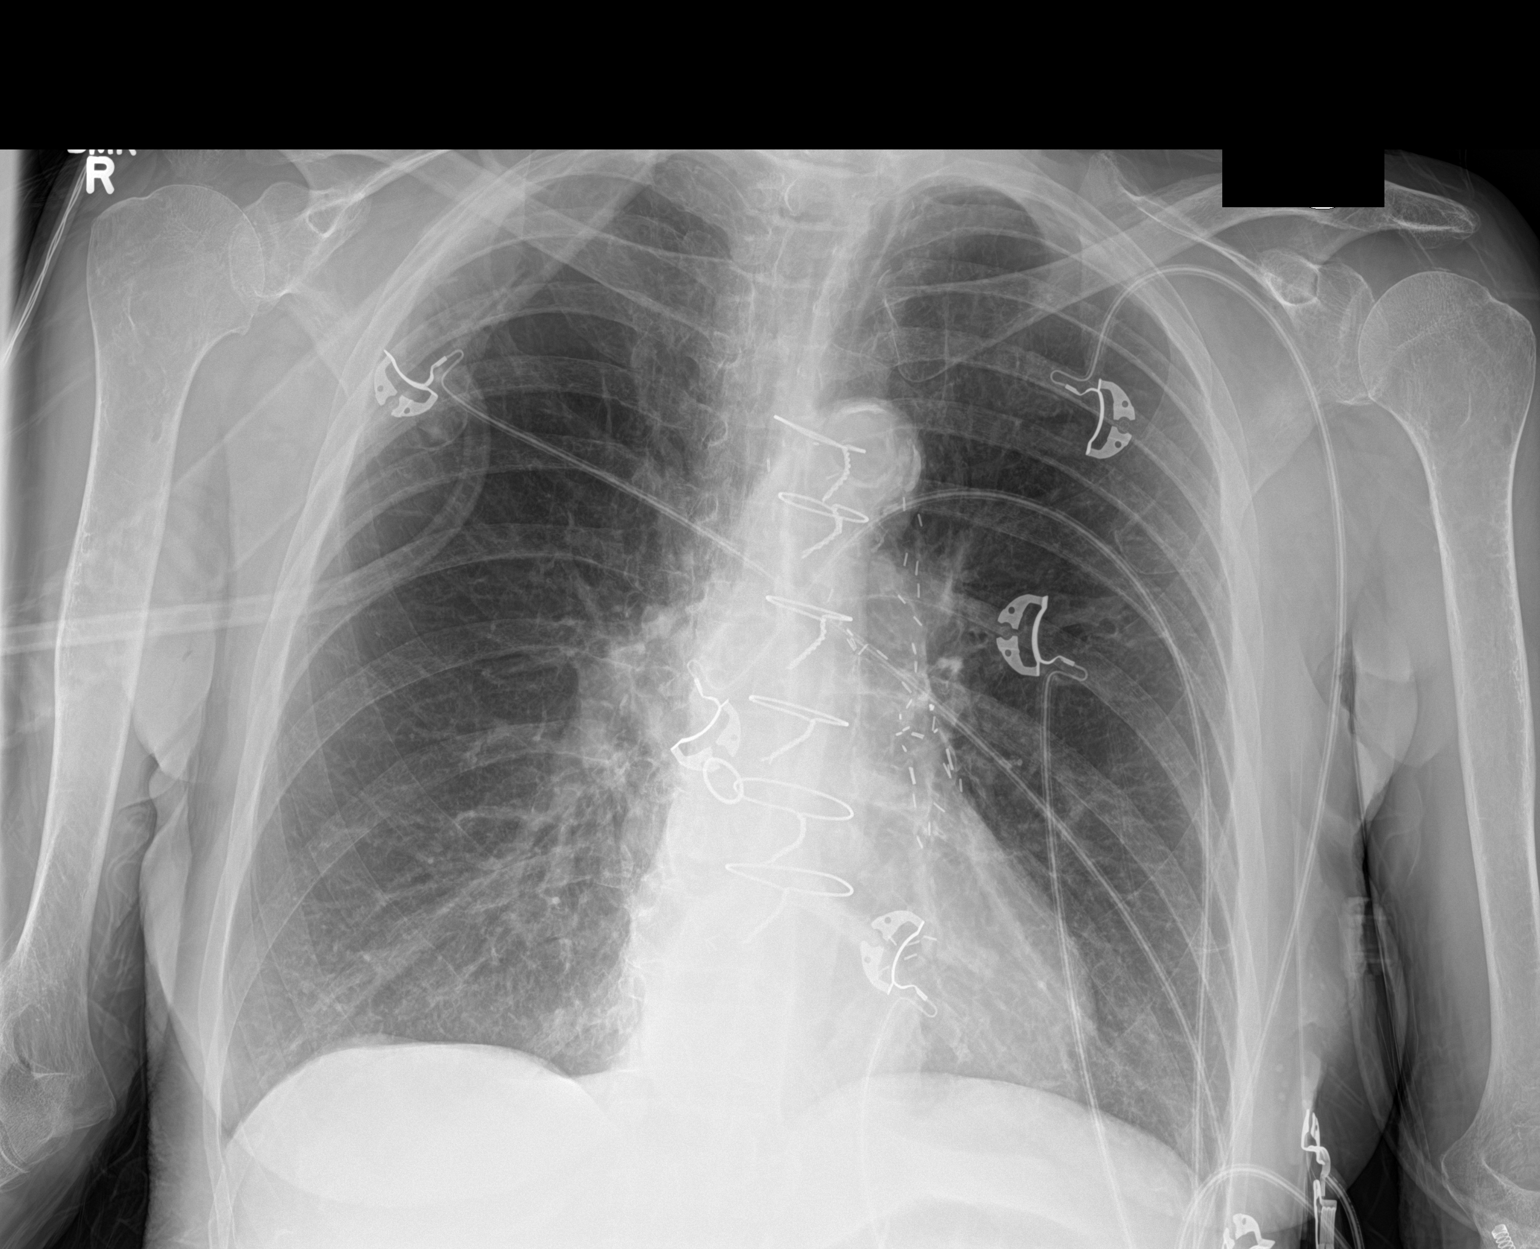

[1 of 1 positions shown; findings below may reference images not displayed]

FINDINGS: Postoperative changes in the mediastinum. Heart size and pulmonary
vascularity are normal. Probable emphysematous changes in the upper
lungs. Scattered fibrosis in the lungs. No airspace disease or
consolidation. No blunting of costophrenic angles. No pneumothorax.
Calcification of the aorta.
IMPRESSION: Emphysematous changes in the lungs. No consolidation or edema.
Aortic atherosclerosis.

## 2018-05-27 IMAGING — DX DG CHEST 1V
1 series · 1 of 1 positions shown · non-contrast
Comparison: 07/07/2017, 05/19/2015

CLINICAL DATA: Shortness of breath

EXAM:
CHEST 1 VIEW

[chest ap]
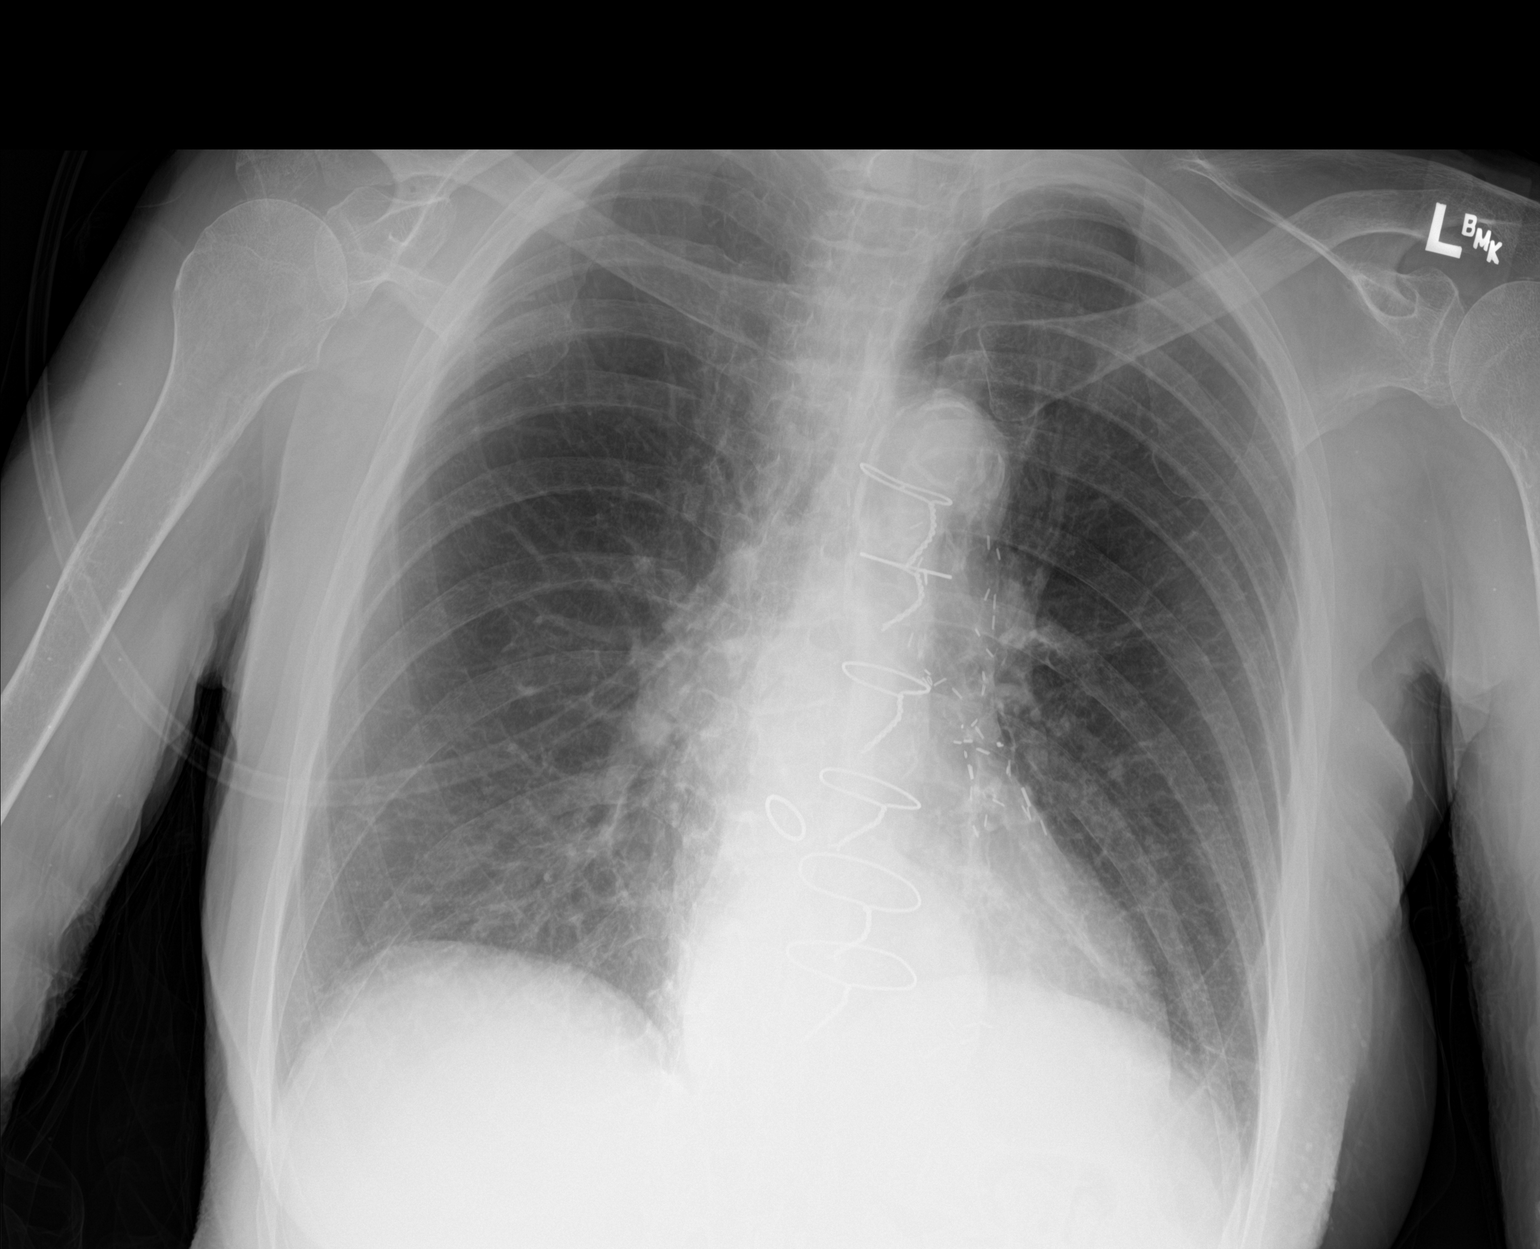

[1 of 1 positions shown; findings below may reference images not displayed]

FINDINGS: Post sternotomy changes. Hyperinflation with emphysematous changes.
Streaky atelectasis at the left lung base. No pleural effusion.
Stable cardiomediastinal silhouette with aortic atherosclerosis. No
pneumothorax. Hiatal hernia.
IMPRESSION: 1. Hyperinflation with emphysematous disease. No focal pulmonary
infiltrate
2. Probable moderate hiatal hernia.

## 2018-05-30 IMAGING — DX DG CHEST 1V
1 series · 1 of 1 positions shown · non-contrast
Comparison: July 10, 2017

CLINICAL DATA: Tachycardia and back pain today

EXAM:
CHEST 1 VIEW

[chest ap]
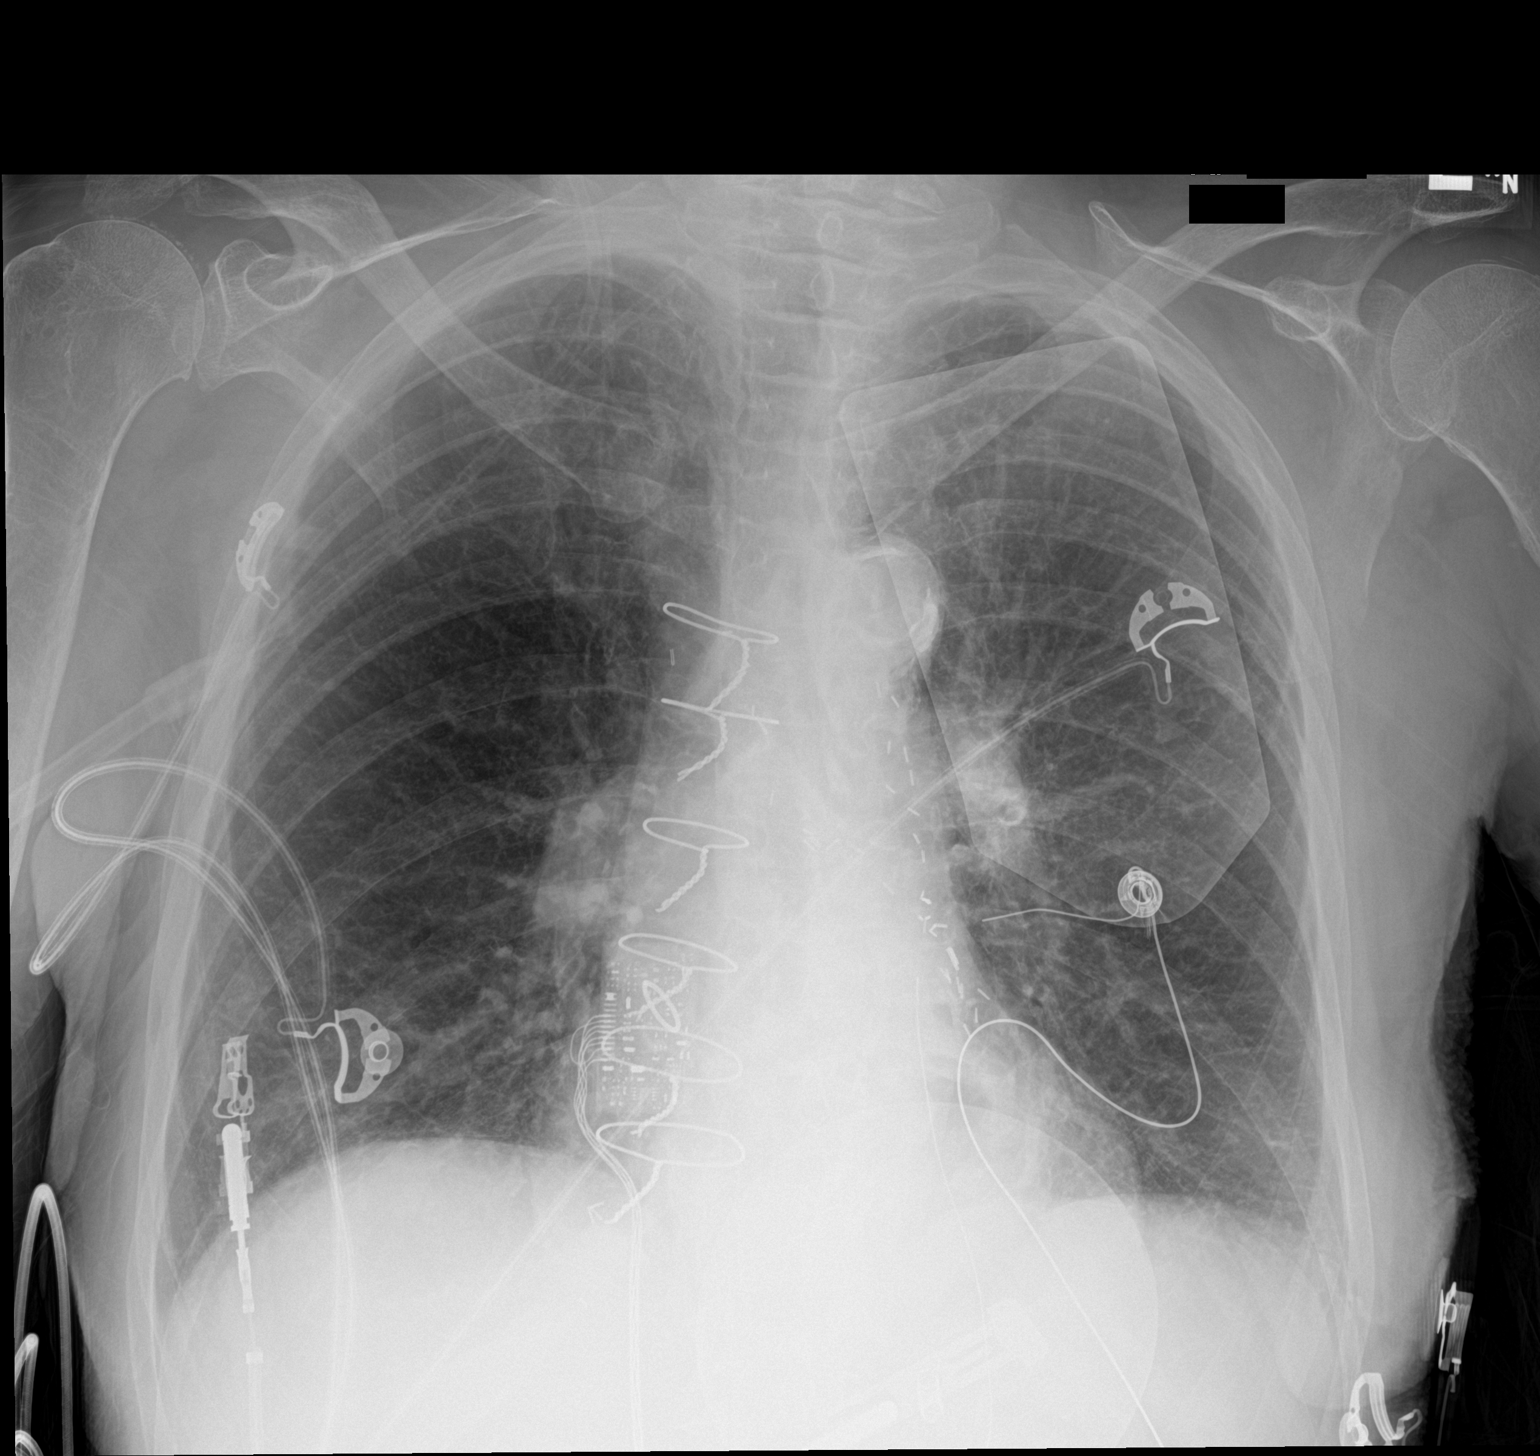

[1 of 1 positions shown; findings below may reference images not displayed]

FINDINGS: The heart size and mediastinal contours are stable. Patient status
post prior CABG and median sternotomy. Both lungs are clear. The
lungs are hyperinflated. There is probably a hiatal hernia. The
visualized skeletal structures are stable.
IMPRESSION: No active cardiopulmonary disease.

## 2018-06-06 ENCOUNTER — Encounter
Admission: RE | Admit: 2018-06-06 | Discharge: 2018-06-06 | Disposition: A | Discharge status: Home / Self Care | Payer: Medicare HMO | Source: Ambulatory Visit | Attending: Internal Medicine | Admitting: Internal Medicine

## 2018-06-06 DIAGNOSIS — D649 Anemia, unspecified: Secondary | ICD-10-CM | POA: Insufficient documentation

## 2018-06-06 DIAGNOSIS — E559 Vitamin D deficiency, unspecified: Secondary | ICD-10-CM | POA: Insufficient documentation

## 2018-06-12 ENCOUNTER — Non-Acute Institutional Stay (SKILLED_NURSING_FACILITY): Payer: Medicare HMO | Admitting: Adult Health

## 2018-06-12 ENCOUNTER — Encounter: Payer: Self-pay | Admitting: Adult Health

## 2018-06-12 DIAGNOSIS — R627 Adult failure to thrive: Secondary | ICD-10-CM | POA: Diagnosis not present

## 2018-06-12 DIAGNOSIS — I5032 Chronic diastolic (congestive) heart failure: Secondary | ICD-10-CM | POA: Diagnosis not present

## 2018-06-12 DIAGNOSIS — J441 Chronic obstructive pulmonary disease with (acute) exacerbation: Secondary | ICD-10-CM | POA: Diagnosis not present

## 2018-06-12 DIAGNOSIS — I25709 Atherosclerosis of coronary artery bypass graft(s), unspecified, with unspecified angina pectoris: Secondary | ICD-10-CM

## 2018-06-12 NOTE — Progress Notes (Signed)
[facility]   [facility] Room Number: [place] of Service:  SNF (31)   CODE STATUS: Full Code  Allergies  Allergen Reactions  . Hydrocodone Other (See Comments)  . Oxycodone Hives    Chief Complaint  Patient presents with  . Medical Management of Chronic Issues    Chronic diastolic heart failure; coronary artery disease involving coronary bypass graft of native heart with angina pectoris; chronic obstructive pulmonary disease with acute exacerbation; failure to thrive in adult.     HPI:  She is a 85 year old long term resident of this facility being seen for the management of her chronic illnesses: heart failure; cad; copd; ftt. She denies any cough or shortness of breath; no chest pain; no changes in appetite; no anxiety or depressive thoughts.   Past Medical History:  Diagnosis Date  . Anxiety    unspecified  . Atrial fibrillation ([place])   . COPD (chronic obstructive pulmonary disease) ([place]) 10/05/2013  . Coronary artery disease 10/05/2013  . Dementia ([place])   . Depression   . Diastolic heart failure ([place])   . Hyperlipidemia, unspecified   . Hypertension 10/05/2013  . Hypothyroidism   . Hypothyroidism, acquired    unspecified  . PVD (peripheral vascular disease) ([place]) 10/05/2013   iliac stents    Past Surgical History:  Procedure Laterality Date  . ABDOMINAL HYSTERECTOMY    . CARDIAC SURGERY    . CARDIOVERSION    . CATARACT EXTRACTION Bilateral 2012  . CORONARY ARTERY BYPASS GRAFT  1985   x3  . ESOPHAGOGASTRODUODENOSCOPY N/A 07/09/2017   Procedure: ESOPHAGOGASTRODUODENOSCOPY (EGD);  Surgeon: Lin Landsman, MD;  Location: [facility] ENDOSCOPY;  Service: Gastroenterology;  Laterality: N/A;  . [place]   patient unsure of which side  . HIP PINNING,CANNULATED Right 05/19/2015   Procedure: CANNULATED HIP PINNING;  Surgeon: Hessie Knows, MD;  Location: [facility] ORS;  Service: Orthopedics;  Laterality: Right;    Social  History   Socioeconomic History  . Marital status: Divorced    Spouse name: Not on file  . Number of children: Not on file  . Years of education: Not on file  . Highest education level: Not on file  Occupational History  . Occupation: retired  Scientific laboratory technician  . Financial resource strain: Not hard at all  . Food insecurity:    Worry: Never true    Inability: Never true  . Transportation needs:    Medical: No    Non-medical: No  Tobacco Use  . Smoking status: Former Smoker    Packs/day: 0.25    Years: 30.00    Pack years: 7.50    Types: Cigarettes    Last attempt to quit: 07/01/2017    Years since quitting: 0.9  . Smokeless tobacco: Never Used  Substance and Sexual Activity  . Alcohol use: Yes    Comment: rare, glass of wine occasionally  . Drug use: No  . Sexual activity: Never  Lifestyle  . Physical activity:    Days per week: 5 days    Minutes per session: 30 min  . Stress: Not at all  Relationships  . Social connections:    Talks on phone: Not on file    Gets together: Not on file    Attends religious service: Not on file    Active member of club or organization: Not on file    Attends meetings of clubs or organizations: Not on file    Relationship  status: Not on file  . Intimate partner violence:    Fear of current or ex partner: Not on file    Emotionally abused: Not on file    Physically abused: Not on file    Forced sexual activity: Not on file  Other Topics Concern  . Not on file  Social History Narrative  . Not on file   Family History  Problem Relation Age of Onset  . Stroke Mother   . Cancer Sister   . Parkinsonism Brother       VITAL SIGNS BP 132/63   Pulse (!) 56   Temp 98.2 F (36.8 C)   Resp 12   Ht 5\' 1"  (1.549 m)   Wt 98 lb 14.4 oz (44.9 kg)   SpO2 100%   BMI 18.69 kg/m   Outpatient Encounter Medications as of 06/12/2018  Medication Sig  . Amino Acids-Protein Hydrolys (FEEDING SUPPLEMENT, PRO-STAT SUGAR FREE 64,) LIQD Take 30  mLs by mouth 2 (two) times daily between meals.  Marland Kitchen aspirin EC 81 MG tablet Take 81 mg by mouth daily.   Marland Kitchen buPROPion (WELLBUTRIN) 75 MG tablet Take 75 mg by mouth daily.   Marland Kitchen docusate sodium (COLACE) 100 MG capsule Take 1 capsule (100 mg total) by mouth 2 (two) times daily.  . feeding supplement, ENSURE ENLIVE, (ENSURE ENLIVE) LIQD Take 237 mLs by mouth 2 (two) times daily. give with a meal for nutrition  . levalbuterol (XOPENEX) 1.25 MG/3ML nebulizer solution Take 1.25 mg by nebulization every 6 (six) hours as needed for wheezing.  Marland Kitchen levothyroxine (SYNTHROID, LEVOTHROID) 75 MCG tablet Take 1 tablet (75 mcg total) by mouth daily before breakfast.  . nitroGLYCERIN (NITROSTAT) 0.4 MG SL tablet Place 0.4 mg under the tongue every 5 (five) minutes as needed for chest pain.  . Nutritional Supplements (NUTRITIONAL SUPPLEMENT PO) Diet Type: Heart Healthy- NO added salt  . OXYGEN Inhale 2 L into the lungs continuous as needed.   . pantoprazole (PROTONIX) 40 MG tablet Take 1 tablet (40 mg total) by mouth 2 (two) times daily.  . polyethylene glycol (MIRALAX / GLYCOLAX) packet Take 17 g by mouth daily.  . sertraline (ZOLOFT) 100 MG tablet Take 100 mg by mouth daily.   . Skin Protectants, Misc. (ENDIT EX) Apply topically. Apply 1 application topically  PRN to peri-area/buttocks  . sodium chloride (OCEAN) 0.65 % nasal spray Place 2 sprays into the nose as needed for congestion.  Marland Kitchen Umeclidinium-Vilanterol (ANORO ELLIPTA) 62.5-25 MCG/INH AEPB Inhale 1 puff into the lungs daily.    No facility-administered encounter medications on file as of 06/12/2018.      SIGNIFICANT DIAGNOSTIC EXAMS  LABS REVIEWED:   08-17-17: chol 239; ldl 155; trig 151; hdl 54; tsh 2.837; mag 2.2; vit D 25.4 08-31-17: wbc 8.2; hgb 10.7; hct 31.9; mcv 90.2; plt 323 glucose 165; bun 46; creat 1.72; k+ 3.5 na++136 ca 9.3 liver normal albumin 3.3 03-06-18: wbc  7.1; hgb 13.4; hct 38.3; mcv 89.5; plt 180; glucose 98; bun 36; creat 1.62; k+ 3.5;  na++ 140; ca 9.4 liver normal albumin 3.6 04-26-18: vit D 45.9  TODAY:   04-26-18: vit D  45.9   Review of Systems  Constitutional: Negative for malaise/fatigue.  Respiratory: Negative for cough and shortness of breath.   Cardiovascular: Negative for chest pain, palpitations and leg swelling.  Gastrointestinal: Negative for abdominal pain, constipation and heartburn.  Musculoskeletal: Negative for back pain, joint pain and myalgias.  Skin: Negative.   Neurological: Negative for  dizziness.  Psychiatric/Behavioral: The patient is not nervous/anxious.     Physical Exam Constitutional:      General: She is not in acute distress.    Appearance: She is well-developed. She is not diaphoretic.  Neck:     Musculoskeletal: Neck supple.     Thyroid: No thyromegaly.  Cardiovascular:     Rate and Rhythm: Normal rate and regular rhythm.     Pulses: Normal pulses.     Heart sounds: Normal heart sounds.  Pulmonary:     Effort: Pulmonary effort is normal. No respiratory distress.     Breath sounds: Normal breath sounds.  Abdominal:     General: Bowel sounds are normal. There is no distension.     Palpations: Abdomen is soft.     Tenderness: There is no abdominal tenderness.  Musculoskeletal:     Right lower leg: No edema.     Left lower leg: No edema.     Comments: Is able to move all extremities   Lymphadenopathy:     Cervical: No cervical adenopathy.  Skin:    General: Skin is warm and dry.  Neurological:     Mental Status: She is alert. Mental status is at baseline.  Psychiatric:        Mood and Affect: Mood normal.      ASSESSMENT/ PLAN:  TODAY:   1. Chronic diastolic heart failure: is stable will continue asa 81 mg daily will continue to monitor  2. COPD: is stable is 02 dependent; will continue  xopenex 1.25 neb treatment  every 6 hours as needed; and anoro 62.5/25 mcg 1 puff daily   3.  CAD is status post CABG X3: is stable will continue asa 81 mg daily has prn  ntg  4. Failure to thrive: is without change weight is 98 (previous 96) pounds; albumin is 3.6 will continue supplements as indicated   PREVIOUS   5. Iron deficiency anemia due to chronic blood loss: stable hgb 13.4; will monitor  6. Depression with anxiety: is stable no excessive depression will continue wellbutrin 75 mg daily and zoloft 100 mg daily   7. Vascular dementia: no change in status; weight is 98 (previous96) pounds; will not make changes will monitor    8. Large hiatal hernia/esophagitis, erosive: is stable will continue protonix 40 mg twice daily   9, hypothyroidism due to acquired atrophy of thyroid: stable tsh 2.837; will continue synthroid 75 mcg daily  10. Chronic constipation: is stable will continue colace twice daily   11. Paroxymal atrial fibrillation: heart rate is controlled; will continue asa 81 mg daily has prn ntg for chest pain.    MD is aware of resident's narcotic use and is in agreement with current plan of care. We will attempt to wean resident as apropriate   Ok Edwards NP [facility]  Contact [phone] Monday through Friday 8am- 5pm  After hours call [phone]

## 2018-06-22 DIAGNOSIS — F0391 Unspecified dementia with behavioral disturbance: Secondary | ICD-10-CM | POA: Diagnosis not present

## 2018-06-22 DIAGNOSIS — J440 Chronic obstructive pulmonary disease with acute lower respiratory infection: Secondary | ICD-10-CM | POA: Diagnosis not present

## 2018-06-22 DIAGNOSIS — I739 Peripheral vascular disease, unspecified: Secondary | ICD-10-CM | POA: Diagnosis not present

## 2018-06-22 DIAGNOSIS — Z789 Other specified health status: Secondary | ICD-10-CM | POA: Diagnosis not present

## 2018-06-22 DIAGNOSIS — Z0000 Encounter for general adult medical examination without abnormal findings: Secondary | ICD-10-CM | POA: Diagnosis not present

## 2018-06-22 DIAGNOSIS — I480 Paroxysmal atrial fibrillation: Secondary | ICD-10-CM | POA: Diagnosis not present

## 2018-06-22 DIAGNOSIS — E440 Moderate protein-calorie malnutrition: Secondary | ICD-10-CM | POA: Diagnosis not present

## 2018-06-22 DIAGNOSIS — I25810 Atherosclerosis of coronary artery bypass graft(s) without angina pectoris: Secondary | ICD-10-CM | POA: Diagnosis not present

## 2018-07-07 ENCOUNTER — Encounter
Admission: RE | Admit: 2018-07-07 | Discharge: 2018-07-07 | Disposition: A | Discharge status: Home / Self Care | Payer: Medicare HMO | Source: Ambulatory Visit | Attending: Internal Medicine | Admitting: Internal Medicine

## 2018-07-07 DIAGNOSIS — E559 Vitamin D deficiency, unspecified: Secondary | ICD-10-CM | POA: Insufficient documentation

## 2018-07-07 DIAGNOSIS — D649 Anemia, unspecified: Secondary | ICD-10-CM | POA: Insufficient documentation

## 2018-07-13 ENCOUNTER — Encounter: Payer: Self-pay | Admitting: Adult Health

## 2018-07-13 ENCOUNTER — Non-Acute Institutional Stay (SKILLED_NURSING_FACILITY): Payer: Medicare HMO | Admitting: Adult Health

## 2018-07-13 ENCOUNTER — Other Ambulatory Visit
Admission: RE | Admit: 2018-07-13 | Discharge: 2018-07-13 | Disposition: A | Discharge status: Home / Self Care | Payer: Medicare HMO | Source: Skilled Nursing Facility | Attending: Adult Health | Admitting: Adult Health

## 2018-07-13 DIAGNOSIS — I130 Hypertensive heart and chronic kidney disease with heart failure and stage 1 through stage 4 chronic kidney disease, or unspecified chronic kidney disease: Secondary | ICD-10-CM | POA: Diagnosis not present

## 2018-07-13 DIAGNOSIS — F0150 Vascular dementia without behavioral disturbance: Secondary | ICD-10-CM

## 2018-07-13 DIAGNOSIS — D500 Iron deficiency anemia secondary to blood loss (chronic): Secondary | ICD-10-CM

## 2018-07-13 DIAGNOSIS — J189 Pneumonia, unspecified organism: Secondary | ICD-10-CM | POA: Diagnosis not present

## 2018-07-13 DIAGNOSIS — F418 Other specified anxiety disorders: Secondary | ICD-10-CM

## 2018-07-13 DIAGNOSIS — E876 Hypokalemia: Secondary | ICD-10-CM

## 2018-07-13 DIAGNOSIS — I480 Paroxysmal atrial fibrillation: Secondary | ICD-10-CM

## 2018-07-13 LAB — CBC WITH DIFFERENTIAL/PLATELET
ABS IMMATURE GRANULOCYTES: 0.16 10*3/uL — AB (ref 0.00–0.07)
BASOS PCT: 0 %
Basophils Absolute: 0.1 10*3/uL (ref 0.0–0.1)
EOS ABS: 0.2 10*3/uL (ref 0.0–0.5)
Eosinophils Relative: 1 %
HCT: 38.8 % (ref 36.0–46.0)
HEMOGLOBIN: 12.6 g/dL (ref 12.0–15.0)
Immature Granulocytes: 1 %
Lymphocytes Relative: 4 %
Lymphs Abs: 0.8 10*3/uL (ref 0.7–4.0)
MCH: 28.6 pg (ref 26.0–34.0)
MCHC: 32.5 g/dL (ref 30.0–36.0)
MCV: 88.2 fL (ref 80.0–100.0)
MONOS PCT: 19 %
Monocytes Absolute: 4.1 10*3/uL — ABNORMAL HIGH (ref 0.1–1.0)
NEUTROS ABS: 16.4 10*3/uL — AB (ref 1.7–7.7)
NEUTROS PCT: 75 %
PLATELETS: 245 10*3/uL (ref 150–400)
RBC: 4.4 MIL/uL (ref 3.87–5.11)
RDW: 14.1 % (ref 11.5–15.5)
Smear Review: NORMAL
WBC: 21.7 10*3/uL — ABNORMAL HIGH (ref 4.0–10.5)
nRBC: 0 % (ref 0.0–0.2)

## 2018-07-13 LAB — COMPREHENSIVE METABOLIC PANEL
ALBUMIN: 3 g/dL — AB (ref 3.5–5.0)
ALT: 15 U/L (ref 0–44)
ANION GAP: 13 (ref 5–15)
AST: 22 U/L (ref 15–41)
Alkaline Phosphatase: 82 U/L (ref 38–126)
BUN: 52 mg/dL — ABNORMAL HIGH (ref 8–23)
CO2: 30 mmol/L (ref 22–32)
Calcium: 8.7 mg/dL — ABNORMAL LOW (ref 8.9–10.3)
Chloride: 92 mmol/L — ABNORMAL LOW (ref 98–111)
Creatinine, Ser: 1.76 mg/dL — ABNORMAL HIGH (ref 0.44–1.00)
GFR calc Af Amer: 30 mL/min — ABNORMAL LOW (ref >60)
GFR calc non Af Amer: 26 mL/min — ABNORMAL LOW (ref >60)
Glucose, Bld: 164 mg/dL — ABNORMAL HIGH (ref 70–99)
Potassium: 2.8 mmol/L — ABNORMAL LOW (ref 3.5–5.1)
Sodium: 135 mmol/L (ref 135–145)
TOTAL PROTEIN: 6.1 g/dL — AB (ref 6.5–8.1)
Total Bilirubin: 0.5 mg/dL (ref 0.3–1.2)

## 2018-07-13 NOTE — Progress Notes (Signed)
[facility]   [facility] Room Number: [place] of Service:  SNF (31)   CODE STATUS: DNR  Allergies  Allergen Reactions  . Hydrocodone Other (See Comments)  . Oxycodone Hives    Chief Complaint  Patient presents with  . Medical Management of Chronic Issues    Paroxysmal atrial fibrillation; vascular dementia without behavioral disturbance; iron deficiency anemia due to chronic blood loss; depression with anxiety.     HPI:  She is a 85 year old long term resident of this facility being seen for the management of her chronic illnesses; afib; dementia; anemia; depression. For the past 4 days she has had nausea; vomiting without diarrhea. There are no reports of fevers; she does have a cough present. There is no sputum production. Her wbc is 21. Her k+ level is 2.8.   Past Medical History:  Diagnosis Date  . Anxiety    unspecified  . Atrial fibrillation ([place])   . COPD (chronic obstructive pulmonary disease) ([place]) 10/05/2013  . Coronary artery disease 10/05/2013  . Dementia ([place])   . Depression   . Diastolic heart failure ([place])   . Hyperlipidemia, unspecified   . Hypertension 10/05/2013  . Hypothyroidism   . Hypothyroidism, acquired    unspecified  . PVD (peripheral vascular disease) ([place]) 10/05/2013   iliac stents    Past Surgical History:  Procedure Laterality Date  . ABDOMINAL HYSTERECTOMY    . CARDIAC SURGERY    . CARDIOVERSION    . CATARACT EXTRACTION Bilateral 2012  . CORONARY ARTERY BYPASS GRAFT  1985   x3  . ESOPHAGOGASTRODUODENOSCOPY N/A 07/09/2017   Procedure: ESOPHAGOGASTRODUODENOSCOPY (EGD);  Surgeon: Lin Landsman, MD;  Location: [facility] ENDOSCOPY;  Service: Gastroenterology;  Laterality: N/A;  . [place]   patient unsure of which side  . HIP PINNING,CANNULATED Right 05/19/2015   Procedure: CANNULATED HIP PINNING;  Surgeon: Hessie Knows, MD;  Location: [facility] ORS;  Service: Orthopedics;  Laterality: Right;     Social History   Socioeconomic History  . Marital status: Divorced    Spouse name: Not on file  . Number of children: Not on file  . Years of education: Not on file  . Highest education level: Not on file  Occupational History  . Occupation: retired  Scientific laboratory technician  . Financial resource strain: Not hard at all  . Food insecurity:    Worry: Never true    Inability: Never true  . Transportation needs:    Medical: No    Non-medical: No  Tobacco Use  . Smoking status: Former Smoker    Packs/day: 0.25    Years: 30.00    Pack years: 7.50    Types: Cigarettes    Last attempt to quit: 07/01/2017    Years since quitting: 1.0  . Smokeless tobacco: Never Used  Substance and Sexual Activity  . Alcohol use: Yes    Comment: rare, glass of wine occasionally  . Drug use: No  . Sexual activity: Never  Lifestyle  . Physical activity:    Days per week: 5 days    Minutes per session: 30 min  . Stress: Not at all  Relationships  . Social connections:    Talks on phone: Not on file    Gets together: Not on file    Attends religious service: Not on file    Active member of club or organization: Not on file    Attends meetings of clubs or organizations: Not  on file    Relationship status: Not on file  . Intimate partner violence:    Fear of current or ex partner: Not on file    Emotionally abused: Not on file    Physically abused: Not on file    Forced sexual activity: Not on file  Other Topics Concern  . Not on file  Social History Narrative  . Not on file   Family History  Problem Relation Age of Onset  . Stroke Mother   . Cancer Sister   . Parkinsonism Brother       VITAL SIGNS BP 136/76   Pulse 76   Temp 97.9 F (36.6 C)   Resp 16   Ht 5\' 1"  (1.549 m)   Wt 95 lb 3.2 oz (43.2 kg)   SpO2 98%   BMI 17.99 kg/m   Outpatient Encounter Medications as of 07/13/2018  Medication Sig  . Amino Acids-Protein Hydrolys (FEEDING SUPPLEMENT, PRO-STAT SUGAR FREE 64,) LIQD  Take 30 mLs by mouth 2 (two) times daily between meals.  Marland Kitchen aspirin EC 81 MG tablet Take 81 mg by mouth daily.   Marland Kitchen buPROPion (WELLBUTRIN) 75 MG tablet Take 75 mg by mouth daily.   Marland Kitchen docusate sodium (COLACE) 100 MG capsule Take 1 capsule (100 mg total) by mouth 2 (two) times daily.  . feeding supplement, ENSURE ENLIVE, (ENSURE ENLIVE) LIQD Take 237 mLs by mouth 2 (two) times daily. give with a meal for nutrition  . levalbuterol (XOPENEX) 1.25 MG/3ML nebulizer solution Take 1.25 mg by nebulization every 6 (six) hours as needed for wheezing.  Marland Kitchen levothyroxine (SYNTHROID, LEVOTHROID) 75 MCG tablet Take 1 tablet (75 mcg total) by mouth daily before breakfast.  . loperamide (IMODIUM A-D) 2 MG tablet Give 2 tablets (4 mg) by mouth with first loose stool, then 1 tablet (2 mg) with each subsequent loose stool up to 8 doses in 24 hours  . nitroGLYCERIN (NITROSTAT) 0.4 MG SL tablet Place 0.4 mg under the tongue every 5 (five) minutes as needed for chest pain.  . Nutritional Supplements (NUTRITIONAL SUPPLEMENT PO) Diet Type: Regular consistency, NAS  . ondansetron (ZOFRAN) 4 MG tablet Take 4 mg by mouth every 6 (six) hours as needed for nausea or vomiting.  . OXYGEN Inhale 2 L into the lungs continuous as needed.   . pantoprazole (PROTONIX) 40 MG tablet Take 1 tablet (40 mg total) by mouth 2 (two) times daily.  . polyethylene glycol (MIRALAX / GLYCOLAX) packet Take 17 g by mouth daily.  . sertraline (ZOLOFT) 100 MG tablet Take 100 mg by mouth daily.   . Skin Protectants, Misc. (ENDIT EX) Apply topically. Apply 1 application topically  PRN to peri-area/buttocks  . sodium chloride (OCEAN) 0.65 % nasal spray Place 2 sprays into the nose as needed for congestion.  Marland Kitchen Umeclidinium-Vilanterol (ANORO ELLIPTA) 62.5-25 MCG/INH AEPB Inhale 1 puff into the lungs daily.    No facility-administered encounter medications on file as of 07/13/2018.      SIGNIFICANT DIAGNOSTIC EXAMS  LABS REVIEWED:   08-17-17: chol 239; ldl  155; trig 151; hdl 54; tsh 2.837; mag 2.2; vit D 25.4 08-31-17: wbc 8.2; hgb 10.7; hct 31.9; mcv 90.2; plt 323 glucose 165; bun 46; creat 1.72; k+ 3.5 na++136 ca 9.3 liver normal albumin 3.3 03-06-18: wbc  7.1; hgb 13.4; hct 38.3; mcv 89.5; plt 180; glucose 98; bun 36; creat 1.62; k+ 3.5; na++ 140; ca 9.4 liver normal albumin 3.6 04-26-18: vit D 45.9 04-26-18: vit D  45.9  TODAY:   07-13-18: wbc 21.7; hgb 12.6; hct 38.8; mcv 88.2; plt 245 glucose 164; bun 52; creat 1.76; k+ 2.8; na++ 135; ca 8.7 liver normal albumin 3.0    Review of Systems  Unable to perform ROS: Medical condition    Physical Exam Constitutional:      General: She is not in acute distress.    Appearance: She is well-developed. She is not diaphoretic.  Neck:     Thyroid: No thyromegaly.  Cardiovascular:     Rate and Rhythm: Normal rate and regular rhythm.     Pulses: Normal pulses.     Heart sounds: Normal heart sounds.  Pulmonary:     Effort: Pulmonary effort is normal. No respiratory distress.     Comments: Has bilateral lower lobe rhonchi Abdominal:     General: Bowel sounds are normal. There is no distension.     Palpations: Abdomen is soft.     Tenderness: There is no abdominal tenderness.  Musculoskeletal: Normal range of motion.  Lymphadenopathy:     Cervical: No cervical adenopathy.  Skin:    General: Skin is warm and dry.  Neurological:     Mental Status: She is alert. She is disoriented.  Psychiatric:        Mood and Affect: Mood normal.     ASSESSMENT/ PLAN:  TODAY:   1. Iron deficiency anemia due to chronic blood loss: stable hgb 12.6; will monitor  2. Depression with anxiety: is stable no excessive depression will continue wellbutrin 75 mg daily and zoloft 100 mg daily   3. Vascular dementia: no change in status; weight is 95 (previous 98) pounds; will not make changes will monitor    4. Paroxymal atrial fibrillation: heart rate is controlled; will continue asa 81 mg daily has prn ntg  for chest pain.   4. HCAP: is worse: will begin rocephin 1 gm IM daily through 07-23-18. Will monitor her status.   5. Hypokalemia: is worse: k+ 2.8; will begin 40 meq X 3 today and then 40 meq daily will check BBMP on 07-16-18   PREVIOUS   6. Large hiatal hernia/esophagitis, erosive: is stable will continue protonix 40 mg twice daily   7, hypothyroidism due to acquired atrophy of thyroid: stable tsh 2.837; will continue synthroid 75 mcg daily  8. Chronic constipation: is stable will continue colace twice daily   9. Chronic diastolic heart failure: is stable will continue asa 81 mg daily will continue to monitor  10. COPD: is stable is 02 dependent; will continue  xopenex 1.25 neb treatment  every 6 hours as needed; and anoro 62.5/25 mcg 1 puff daily   11.  CAD is status post CABG X3: is stable will continue asa 81 mg daily has prn ntg  12. Failure to thrive: is worse; has lost weight weight is 95 (previous 98) pounds; albumin is 3.0 will continue supplements as indicated    Will get chest x-ray and kub  Will give prevnar 47      MD is aware of resident's narcotic use and is in agreement with current plan of care. We will attempt to wean resident as apropriate   Ok Edwards NP [facility]  Contact [phone] Monday through Friday 8am- 5pm  After hours call [phone]

## 2018-07-14 DIAGNOSIS — J811 Chronic pulmonary edema: Secondary | ICD-10-CM | POA: Diagnosis not present

## 2018-07-14 DIAGNOSIS — R05 Cough: Secondary | ICD-10-CM | POA: Diagnosis not present

## 2018-07-14 DIAGNOSIS — R112 Nausea with vomiting, unspecified: Secondary | ICD-10-CM | POA: Diagnosis not present

## 2018-07-16 ENCOUNTER — Encounter: Payer: Self-pay | Admitting: Adult Health

## 2018-07-16 ENCOUNTER — Non-Acute Institutional Stay (SKILLED_NURSING_FACILITY): Payer: Medicare HMO | Admitting: Adult Health

## 2018-07-16 ENCOUNTER — Other Ambulatory Visit
Admission: RE | Admit: 2018-07-16 | Discharge: 2018-07-16 | Disposition: A | Discharge status: Home / Self Care | Payer: Medicare HMO | Source: Ambulatory Visit | Attending: Adult Health | Admitting: Adult Health

## 2018-07-16 DIAGNOSIS — E876 Hypokalemia: Secondary | ICD-10-CM | POA: Diagnosis not present

## 2018-07-16 DIAGNOSIS — J189 Pneumonia, unspecified organism: Secondary | ICD-10-CM | POA: Diagnosis not present

## 2018-07-16 DIAGNOSIS — I130 Hypertensive heart and chronic kidney disease with heart failure and stage 1 through stage 4 chronic kidney disease, or unspecified chronic kidney disease: Secondary | ICD-10-CM | POA: Insufficient documentation

## 2018-07-16 LAB — BASIC METABOLIC PANEL
Anion gap: 9 (ref 5–15)
BUN: 48 mg/dL — AB (ref 8–23)
CO2: 26 mmol/L (ref 22–32)
Calcium: 8.7 mg/dL — ABNORMAL LOW (ref 8.9–10.3)
Chloride: 101 mmol/L (ref 98–111)
Creatinine, Ser: 1.56 mg/dL — ABNORMAL HIGH (ref 0.44–1.00)
GFR calc Af Amer: 35 mL/min — ABNORMAL LOW (ref >60)
GFR calc non Af Amer: 30 mL/min — ABNORMAL LOW (ref >60)
Glucose, Bld: 93 mg/dL (ref 70–99)
Potassium: 4.5 mmol/L (ref 3.5–5.1)
Sodium: 136 mmol/L (ref 135–145)

## 2018-07-16 NOTE — Progress Notes (Signed)
Location:   [facility] Room Number: 303 A Place of Service:  SNF (31)   CODE STATUS: dnr   Allergies  Allergen Reactions  . Hydrocodone Other (See Comments)  . Oxycodone Hives    Chief Complaint  Patient presents with  . Acute Visit    Follow up Xray results    HPI:   She is improving. She is able to get out of bed and is participating with therapy. She is presently on IM rocephin. She is tolerating without difficulty. Her k+ level is normal. She denies a cough; no reports of fevers; her appetite has improved.    Past Medical History:  Diagnosis Date  . Anxiety    unspecified  . Atrial fibrillation ([place])   . COPD (chronic obstructive pulmonary disease) ([place]) 10/05/2013  . Coronary artery disease 10/05/2013  . Dementia ([place])   . Depression   . Diastolic heart failure ([place])   . Hyperlipidemia, unspecified   . Hypertension 10/05/2013  . Hypothyroidism   . Hypothyroidism, acquired    unspecified  . PVD (peripheral vascular disease) ([place]) 10/05/2013   iliac stents    Past Surgical History:  Procedure Laterality Date  . ABDOMINAL HYSTERECTOMY    . CARDIAC SURGERY    . CARDIOVERSION    . CATARACT EXTRACTION Bilateral 2012  . CORONARY ARTERY BYPASS GRAFT  1985   x3  . ESOPHAGOGASTRODUODENOSCOPY N/A 07/09/2017   Procedure: ESOPHAGOGASTRODUODENOSCOPY (EGD);  Surgeon: Lin Landsman, MD;  Location: [facility] ENDOSCOPY;  Service: Gastroenterology;  Laterality: N/A;  . [place]   patient unsure of which side  . HIP PINNING,CANNULATED Right 05/19/2015   Procedure: CANNULATED HIP PINNING;  Surgeon: Hessie Knows, MD;  Location: [facility] ORS;  Service: Orthopedics;  Laterality: Right;    Social History   Socioeconomic History  . Marital status: Divorced    Spouse name: Not on file  . Number of children: Not on file  . Years of education: Not on file  . Highest education level: Not on file  Occupational History  . Occupation: retired    Scientific laboratory technician  . Financial resource strain: Not hard at all  . Food insecurity:    Worry: Never true    Inability: Never true  . Transportation needs:    Medical: No    Non-medical: No  Tobacco Use  . Smoking status: Former Smoker    Packs/day: 0.25    Years: 30.00    Pack years: 7.50    Types: Cigarettes    Last attempt to quit: 07/01/2017    Years since quitting: 1.0  . Smokeless tobacco: Never Used  Substance and Sexual Activity  . Alcohol use: Yes    Comment: rare, glass of wine occasionally  . Drug use: No  . Sexual activity: Never  Lifestyle  . Physical activity:    Days per week: 5 days    Minutes per session: 30 min  . Stress: Not at all  Relationships  . Social connections:    Talks on phone: Not on file    Gets together: Not on file    Attends religious service: Not on file    Active member of club or organization: Not on file    Attends meetings of clubs or organizations: Not on file    Relationship status: Not on file  . Intimate partner violence:    Fear of current or ex partner: Not on file    Emotionally abused: Not on file  Physically abused: Not on file    Forced sexual activity: Not on file  Other Topics Concern  . Not on file  Social History Narrative  . Not on file   Family History  Problem Relation Age of Onset  . Stroke Mother   . Cancer Sister   . Parkinsonism Brother       VITAL SIGNS BP 138/86   Pulse 91   Temp 98.2 F (36.8 C)   Resp 17   Ht 5\' 1"  (1.549 m)   Wt 95 lb 3.2 oz (43.2 kg)   SpO2 98%   BMI 17.99 kg/m   Outpatient Encounter Medications as of 07/16/2018  Medication Sig  . Amino Acids-Protein Hydrolys (FEEDING SUPPLEMENT, PRO-STAT SUGAR FREE 64,) LIQD Take 30 mLs by mouth 2 (two) times daily between meals.  Marland Kitchen aspirin EC 81 MG tablet Take 81 mg by mouth daily.   Marland Kitchen buPROPion (WELLBUTRIN) 75 MG tablet Take 75 mg by mouth daily.   . cefTRIAXone (ROCEPHIN) 1 g injection Inject 1 g into the muscle daily.  Marland Kitchen docusate  sodium (COLACE) 100 MG capsule Take 1 capsule (100 mg total) by mouth 2 (two) times daily.  . feeding supplement, ENSURE ENLIVE, (ENSURE ENLIVE) LIQD Take 237 mLs by mouth 2 (two) times daily. give with a meal for nutrition  . levalbuterol (XOPENEX) 1.25 MG/3ML nebulizer solution Take 1.25 mg by nebulization every 6 (six) hours as needed for wheezing.  Marland Kitchen levothyroxine (SYNTHROID, LEVOTHROID) 75 MCG tablet Take 1 tablet (75 mcg total) by mouth daily before breakfast.  . [EXPIRED] loperamide (IMODIUM A-D) 2 MG tablet Give 2 tablets (4 mg) by mouth with first loose stool, then 1 tablet (2 mg) with each subsequent loose stool up to 8 doses in 24 hours  . nitroGLYCERIN (NITROSTAT) 0.4 MG SL tablet Place 0.4 mg under the tongue every 5 (five) minutes as needed for chest pain.  . Nutritional Supplements (NUTRITIONAL SUPPLEMENT PO) Diet Type: Regular consistency, NAS  . ondansetron (ZOFRAN) 4 MG tablet Take 4 mg by mouth every 6 (six) hours as needed for nausea or vomiting.  . OXYGEN Inhale 2 L into the lungs continuous as needed.   . pantoprazole (PROTONIX) 40 MG tablet Take 1 tablet (40 mg total) by mouth 2 (two) times daily.  . polyethylene glycol (MIRALAX / GLYCOLAX) packet Take 17 g by mouth daily.  . potassium chloride SA (K-DUR,KLOR-CON) 20 MEQ tablet Take 40 mEq by mouth daily.  . sertraline (ZOLOFT) 100 MG tablet Take 100 mg by mouth daily.   . Skin Protectants, Misc. (ENDIT EX) Apply topically. Apply 1 application topically  PRN to peri-area/buttocks  . sodium chloride (OCEAN) 0.65 % nasal spray Place 2 sprays into the nose as needed for congestion.  Marland Kitchen Umeclidinium-Vilanterol (ANORO ELLIPTA) 62.5-25 MCG/INH AEPB Inhale 1 puff into the lungs daily.    No facility-administered encounter medications on file as of 07/16/2018.      SIGNIFICANT DIAGNOSTIC EXAMS   TODAY:   07-13-18: chest x-ray: 1. No acute cardiac or pulmonary pathology 2. Cardiac size mildly prominent. 3. Mild osteoporosis  demonstrated. 4. Mild degenerative arthritis.   07-13-18: kub: 1. No bowel obstruction 2. Moderate gas in nondistended loops of small bowel and colon. Findings may represent ileus. 3. Mild osteopenia; 4. Mild degree of spondylosis.   LABS REVIEWED:   08-17-17: chol 239; ldl 155; trig 151; hdl 54; tsh 2.837; mag 2.2; vit D 25.4 08-31-17: wbc 8.2; hgb 10.7; hct 31.9; mcv 90.2; plt 323  glucose 165; bun 46; creat 1.72; k+ 3.5 na++136 ca 9.3 liver normal albumin 3.3 03-06-18: wbc  7.1; hgb 13.4; hct 38.3; mcv 89.5; plt 180; glucose 98; bun 36; creat 1.62; k+ 3.5; na++ 140; ca 9.4 liver normal albumin 3.6 04-26-18: vit D 45.9 04-26-18: vit D  45.9 07-13-18: wbc 21.7; hgb 12.6; hct 38.8; mcv 88.2; plt 245 glucose 164; bun 52; creat 1.76; k+ 2.8; na++ 135; ca 8.7 liver normal albumin 3.0  TODAY:   07-16-18: glucose 93; bun 48; creat 1.56;  k+ 4.5; na++ 136 ca 8.7    Review of Systems  Constitutional: Negative for malaise/fatigue.  Respiratory: Negative for cough and shortness of breath.   Cardiovascular: Negative for chest pain, palpitations and leg swelling.  Gastrointestinal: Negative for abdominal pain, constipation and heartburn.  Musculoskeletal: Negative for back pain, joint pain and myalgias.  Skin: Negative.   Neurological: Negative for dizziness.  Psychiatric/Behavioral: The patient is not nervous/anxious.     Physical Exam Constitutional:      General: She is not in acute distress.    Appearance: She is well-developed. She is not diaphoretic.  Neck:     Musculoskeletal: Neck supple.     Thyroid: No thyromegaly.  Cardiovascular:     Rate and Rhythm: Normal rate and regular rhythm.     Pulses: Normal pulses.     Heart sounds: Normal heart sounds.  Pulmonary:     Effort: Pulmonary effort is normal. No respiratory distress.     Comments: Breath sounds diminished  Abdominal:     General: Bowel sounds are normal. There is no distension.     Palpations: Abdomen is soft.      Tenderness: There is no abdominal tenderness.  Musculoskeletal:     Right lower leg: No edema.     Left lower leg: No edema.     Comments: Is able to move all extremities   Lymphadenopathy:     Cervical: No cervical adenopathy.  Skin:    General: Skin is warm and dry.  Neurological:     Mental Status: She is alert. Mental status is at baseline.  Psychiatric:        Mood and Affect: Mood normal.     ASSESSMENT/ PLAN:  TODAY;   1. HCAP; is stable will complete rocephin and will monitor her status.  2. Hypokalemia: k+ 4.1; will continue k+ 40 meq daily       MD is aware of resident's narcotic use and is in agreement with current plan of care. We will attempt to wean resident as apropriate   Ok Edwards NP [facility]  Contact [phone] Monday through Friday 8am- 5pm  After hours call [phone]

## 2018-07-17 DIAGNOSIS — J189 Pneumonia, unspecified organism: Secondary | ICD-10-CM | POA: Insufficient documentation

## 2018-07-17 DIAGNOSIS — E876 Hypokalemia: Secondary | ICD-10-CM | POA: Insufficient documentation

## 2018-07-18 ENCOUNTER — Non-Acute Institutional Stay (SKILLED_NURSING_FACILITY): Payer: Medicare HMO | Admitting: Adult Health

## 2018-07-18 ENCOUNTER — Encounter: Payer: Self-pay | Admitting: Adult Health

## 2018-07-18 DIAGNOSIS — F418 Other specified anxiety disorders: Secondary | ICD-10-CM | POA: Diagnosis not present

## 2018-07-18 DIAGNOSIS — I5032 Chronic diastolic (congestive) heart failure: Secondary | ICD-10-CM

## 2018-07-18 DIAGNOSIS — F0150 Vascular dementia without behavioral disturbance: Secondary | ICD-10-CM | POA: Diagnosis not present

## 2018-07-18 DIAGNOSIS — R627 Adult failure to thrive: Secondary | ICD-10-CM

## 2018-07-18 NOTE — Progress Notes (Signed)
Location:   [facility] Room Number: [place] of Service:  SNF (31)   CODE STATUS: DNR  Allergies  Allergen Reactions  . Hydrocodone Other (See Comments)  . Oxycodone Hives    Chief Complaint  Patient presents with  . Acute Visit    Care Plan Meeting    HPI:  We have come together for her routine care plan meeting. She does have family present. She is presently being treated for HCAP.  She continues to have a poor appetite and weight loss. At her previous care plan meeting we had taken her off several appetite offending medications. She is on Wellbutrin which can inhibit appetite. Her family is in agreement to wean her off this medication.  There are no reports of uncontrolled pain; she is sleeping well at night; no reports of depressive thoughts. She continues to be followed for her chronic illnesses including: chronic diastolic heart failure; vascular dementia without behavioral disturbance; failure to thrive in adult; depression with anxiety.   Past Medical History:  Diagnosis Date  . Anxiety    unspecified  . Atrial fibrillation ([place])   . COPD (chronic obstructive pulmonary disease) ([place]) 10/05/2013  . Coronary artery disease 10/05/2013  . Dementia ([place])   . Depression   . Diastolic heart failure ([place])   . Hyperlipidemia, unspecified   . Hypertension 10/05/2013  . Hypothyroidism   . Hypothyroidism, acquired    unspecified  . PVD (peripheral vascular disease) ([place]) 10/05/2013   iliac stents    Past Surgical History:  Procedure Laterality Date  . ABDOMINAL HYSTERECTOMY    . CARDIAC SURGERY    . CARDIOVERSION    . CATARACT EXTRACTION Bilateral 2012  . CORONARY ARTERY BYPASS GRAFT  1985   x3  . ESOPHAGOGASTRODUODENOSCOPY N/A 07/09/2017   Procedure: ESOPHAGOGASTRODUODENOSCOPY (EGD);  Surgeon: Lin Landsman, MD;  Location: [facility] ENDOSCOPY;  Service: Gastroenterology;  Laterality: N/A;  . [place]   patient unsure  of which side  . HIP PINNING,CANNULATED Right 05/19/2015   Procedure: CANNULATED HIP PINNING;  Surgeon: Hessie Knows, MD;  Location: [facility] ORS;  Service: Orthopedics;  Laterality: Right;    Social History   Socioeconomic History  . Marital status: Divorced    Spouse name: Not on file  . Number of children: Not on file  . Years of education: Not on file  . Highest education level: Not on file  Occupational History  . Occupation: retired  Scientific laboratory technician  . Financial resource strain: Not hard at all  . Food insecurity:    Worry: Never true    Inability: Never true  . Transportation needs:    Medical: No    Non-medical: No  Tobacco Use  . Smoking status: Former Smoker    Packs/day: 0.25    Years: 30.00    Pack years: 7.50    Types: Cigarettes    Last attempt to quit: 07/01/2017    Years since quitting: 1.0  . Smokeless tobacco: Never Used  Substance and Sexual Activity  . Alcohol use: Yes    Comment: rare, glass of wine occasionally  . Drug use: No  . Sexual activity: Never  Lifestyle  . Physical activity:    Days per week: 5 days    Minutes per session: 30 min  . Stress: Not at all  Relationships  . Social connections:    Talks on phone: Not on file    Gets together: Not on file  Attends religious service: Not on file    Active member of club or organization: Not on file    Attends meetings of clubs or organizations: Not on file    Relationship status: Not on file  . Intimate partner violence:    Fear of current or ex partner: Not on file    Emotionally abused: Not on file    Physically abused: Not on file    Forced sexual activity: Not on file  Other Topics Concern  . Not on file  Social History Narrative  . Not on file   Family History  Problem Relation Age of Onset  . Stroke Mother   . Cancer Sister   . Parkinsonism Brother       VITAL SIGNS BP 138/86   Pulse 91   Temp 98.2 F (36.8 C)   Resp 17   Ht 5\' 1"  (1.549 m)   Wt 95 lb 3.2 oz (43.2 kg)    SpO2 98%   BMI 17.99 kg/m   Outpatient Encounter Medications as of 07/18/2018  Medication Sig  . Amino Acids-Protein Hydrolys (FEEDING SUPPLEMENT, PRO-STAT SUGAR FREE 64,) LIQD Take 30 mLs by mouth 3 (three) times daily with meals.   Marland Kitchen aspirin EC 81 MG tablet Take 81 mg by mouth daily.   Marland Kitchen buPROPion (WELLBUTRIN) 75 MG tablet Take 75 mg by mouth daily.   . cefTRIAXone (ROCEPHIN) 1 g injection Inject 1 g into the muscle daily.  Marland Kitchen docusate sodium (COLACE) 100 MG capsule Take 1 capsule (100 mg total) by mouth 2 (two) times daily.  . feeding supplement, ENSURE ENLIVE, (ENSURE ENLIVE) LIQD Take 237 mLs by mouth 2 (two) times daily. give with a meal for nutrition  . levalbuterol (XOPENEX) 1.25 MG/3ML nebulizer solution Take 1.25 mg by nebulization every 6 (six) hours as needed for wheezing.  Marland Kitchen levothyroxine (SYNTHROID, LEVOTHROID) 75 MCG tablet Take 1 tablet (75 mcg total) by mouth daily before breakfast.  . nitroGLYCERIN (NITROSTAT) 0.4 MG SL tablet Place 0.4 mg under the tongue every 5 (five) minutes as needed for chest pain.  . Nutritional Supplements (NUTRITIONAL SUPPLEMENT PO) Diet Type: Regular consistency, NAS  . ondansetron (ZOFRAN) 4 MG tablet Take 4 mg by mouth every 6 (six) hours as needed for nausea or vomiting.  . OXYGEN Inhale 2 L into the lungs continuous as needed.   . pantoprazole (PROTONIX) 40 MG tablet Take 1 tablet (40 mg total) by mouth 2 (two) times daily.  . polyethylene glycol (MIRALAX / GLYCOLAX) packet Take 17 g by mouth daily.  . potassium chloride SA (K-DUR,KLOR-CON) 20 MEQ tablet Take 40 mEq by mouth daily.  . sertraline (ZOLOFT) 100 MG tablet Take 100 mg by mouth daily.   . Skin Protectants, Misc. (ENDIT EX) Apply topically. Apply 1 application topically  PRN to peri-area/buttocks  . sodium chloride (OCEAN) 0.65 % nasal spray Place 2 sprays into the nose as needed for congestion.  Marland Kitchen Umeclidinium-Vilanterol (ANORO ELLIPTA) 62.5-25 MCG/INH AEPB Inhale 1 puff into the  lungs daily.    No facility-administered encounter medications on file as of 07/18/2018.      SIGNIFICANT DIAGNOSTIC EXAMS  PREVIOUS :   07-13-18: chest x-ray: 1. No acute cardiac or pulmonary pathology 2. Cardiac size mildly prominent. 3. Mild osteoporosis demonstrated. 4. Mild degenerative arthritis.   07-13-18: kub: 1. No bowel obstruction 2. Moderate gas in nondistended loops of small bowel and colon. Findings may represent ileus. 3. Mild osteopenia; 4. Mild degree of spondylosis.   NO  NEW LABS.   LABS REVIEWED: PREVIOUS   08-17-17: chol 239; ldl 155; trig 151; hdl 54; tsh 2.837; mag 2.2; vit D 25.4 08-31-17: wbc 8.2; hgb 10.7; hct 31.9; mcv 90.2; plt 323 glucose 165; bun 46; creat 1.72; k+ 3.5 na++136 ca 9.3 liver normal albumin 3.3 03-06-18: wbc  7.1; hgb 13.4; hct 38.3; mcv 89.5; plt 180; glucose 98; bun 36; creat 1.62; k+ 3.5; na++ 140; ca 9.4 liver normal albumin 3.6 04-26-18: vit D 45.9 04-26-18: vit D  45.9 07-13-18: wbc 21.7; hgb 12.6; hct 38.8; mcv 88.2; plt 245 glucose 164; bun 52; creat 1.76; k+ 2.8; na++ 135; ca 8.7 liver normal albumin 3.0 07-16-18: glucose 93; bun 48; creat 1.56;  k+ 4.5; na++ 136 ca 8.7   NO NEW LABS.    Review of Systems  Constitutional: Negative for malaise/fatigue.  Respiratory: Negative for cough and shortness of breath.   Cardiovascular: Negative for chest pain, palpitations and leg swelling.  Gastrointestinal: Negative for abdominal pain, constipation and heartburn.  Musculoskeletal: Negative for back pain, joint pain and myalgias.  Skin: Negative.   Neurological: Negative for dizziness.  Psychiatric/Behavioral: The patient is not nervous/anxious.     Physical Exam Constitutional:      General: She is not in acute distress.    Appearance: She is well-developed. She is not diaphoretic.  Neck:     Musculoskeletal: Neck supple.     Thyroid: No thyromegaly.  Cardiovascular:     Rate and Rhythm: Normal rate and regular rhythm.     Pulses:  Normal pulses.     Heart sounds: Normal heart sounds.  Pulmonary:     Effort: Pulmonary effort is normal. No respiratory distress.     Breath sounds: Normal breath sounds.  Abdominal:     General: Bowel sounds are normal. There is no distension.     Palpations: Abdomen is soft.     Tenderness: There is no abdominal tenderness.  Musculoskeletal:     Right lower leg: No edema.     Left lower leg: No edema.     Comments: Is able to move all extremities   Lymphadenopathy:     Cervical: No cervical adenopathy.  Skin:    General: Skin is warm and dry.  Neurological:     Mental Status: She is alert. Mental status is at baseline.  Psychiatric:        Mood and Affect: Mood normal.      ASSESSMENT/ PLAN:  TODAY;   1. Chronic diastolic heart failure 2. Vascular dementia without behavorial disturbance 3. Failure to thrive in adult 4. Depression with anxiety  Will check cbc cmp for follow up  Will wean off wellbutrin to every other day through 08-01-18 then stop  Will continue her current plan of care.   Time spent with patient and family : 30 minutes: we have reviewed her medications; status and goals of care; verbalized understanding.   MD is aware of resident's narcotic use and is in agreement with current plan of care. We will attempt to wean resident as apropriate   Ok Edwards NP [facility]  Contact [phone] Monday through Friday 8am- 5pm  After hours call [phone]

## 2018-07-19 ENCOUNTER — Other Ambulatory Visit
Admission: RE | Admit: 2018-07-19 | Discharge: 2018-07-19 | Disposition: A | Discharge status: Home / Self Care | Payer: Medicare HMO | Source: Ambulatory Visit | Attending: Adult Health | Admitting: Adult Health

## 2018-07-19 DIAGNOSIS — I130 Hypertensive heart and chronic kidney disease with heart failure and stage 1 through stage 4 chronic kidney disease, or unspecified chronic kidney disease: Secondary | ICD-10-CM | POA: Insufficient documentation

## 2018-07-19 LAB — BASIC METABOLIC PANEL
Anion gap: 6 (ref 5–15)
BUN: 29 mg/dL — ABNORMAL HIGH (ref 8–23)
CO2: 30 mmol/L (ref 22–32)
CREATININE: 1.6 mg/dL — AB (ref 0.44–1.00)
Calcium: 8.7 mg/dL — ABNORMAL LOW (ref 8.9–10.3)
Chloride: 103 mmol/L (ref 98–111)
GFR calc Af Amer: 33 mL/min — ABNORMAL LOW (ref >60)
GFR calc non Af Amer: 29 mL/min — ABNORMAL LOW (ref >60)
Glucose, Bld: 96 mg/dL (ref 70–99)
Potassium: 4 mmol/L (ref 3.5–5.1)
SODIUM: 139 mmol/L (ref 135–145)

## 2018-07-19 LAB — CBC WITH DIFFERENTIAL/PLATELET
Abs Immature Granulocytes: 0 10*3/uL (ref 0.00–0.07)
BASOS ABS: 0 10*3/uL (ref 0.0–0.1)
Basophils Relative: 0 %
Eosinophils Absolute: 0.1 10*3/uL (ref 0.0–0.5)
Eosinophils Relative: 1 %
HCT: 38.5 % (ref 36.0–46.0)
Hemoglobin: 12.2 g/dL (ref 12.0–15.0)
Lymphocytes Relative: 10 %
Lymphs Abs: 0.9 10*3/uL (ref 0.7–4.0)
MCH: 29 pg (ref 26.0–34.0)
MCHC: 31.7 g/dL (ref 30.0–36.0)
MCV: 91.4 fL (ref 80.0–100.0)
Monocytes Absolute: 1.6 10*3/uL — ABNORMAL HIGH (ref 0.1–1.0)
Monocytes Relative: 17 %
NEUTROS ABS: 6.8 10*3/uL (ref 1.7–7.7)
NEUTROS PCT: 72 %
Platelets: 258 10*3/uL (ref 150–400)
RBC: 4.21 MIL/uL (ref 3.87–5.11)
RDW: 13.7 % (ref 11.5–15.5)
Smear Review: NORMAL
WBC: 9.4 10*3/uL (ref 4.0–10.5)
nRBC: 0 % (ref 0.0–0.2)

## 2018-07-30 ENCOUNTER — Non-Acute Institutional Stay (SKILLED_NURSING_FACILITY): Payer: Medicare HMO | Admitting: Adult Health

## 2018-07-30 ENCOUNTER — Encounter: Payer: Self-pay | Admitting: Adult Health

## 2018-07-30 DIAGNOSIS — R059 Cough, unspecified: Secondary | ICD-10-CM

## 2018-07-30 DIAGNOSIS — R05 Cough: Secondary | ICD-10-CM

## 2018-07-30 NOTE — Progress Notes (Signed)
Location[facility]  [facility] Room Number: 267-T Place of Service:  SNF ([phone]) Provider:  Durenda Age, NP  Patient Care Team: Kirk Ruths, MD as PCP - General (Internal Medicine) Gerlene Fee, NP as Nurse Practitioner (Geriatric Medicine)  Extended Emergency Contact Information Primary Emergency Contact: Drema Balzarine Address: [address]          [place], [place] [address] Home Phone: [phone] Mobile Phone: [phone] Relation: Daughter Secondary Emergency Contact: [place] Phone: [phone] Mobile Phone: [phone] Relation: Daughter  Code Status:  DNR  Goals of care: Advanced Directive information Advanced Directives 07/30/2018  Does Patient Have a Medical Advance Directive? Yes  Type of Advance Directive Out of facility DNR (pink MOST or yellow form)  Does patient want to make changes to medical advance directive? No - Patient declined  Copy of [place] in Chart? -  Would patient like information on creating a medical advance directive? -  Pre-existing out of facility DNR order (yellow form or pink MOST form) -     Chief Complaint  Patient presents with  . Acute Visit    Patient with complaints of URI symptoms - cough and congestion.    HPI:  Pt is an 85 y.o. female seen today for an acute visit at the request of [facility] staff for cough and congestion.  She is a long-term care resident of [facility]  She has a PMH of atrial fibrillation, COPD, CAD, dementia, dystolic heart failure, hypothyroidism, hypertension, anxiety, and hyperlipidemia. She was seen today with son beside her. She has productive cough with whitish phlegm. No reported fever.   Past Medical History:  Diagnosis Date  . Anxiety    unspecified  . Atrial fibrillation ([place])   . COPD (chronic obstructive pulmonary disease) ([place]) 10/05/2013  . Coronary artery disease 10/05/2013  . Dementia ([place])   .  Depression   . Diastolic heart failure ([place])   . Hyperlipidemia, unspecified   . Hypertension 10/05/2013  . Hypothyroidism   . Hypothyroidism, acquired    unspecified  . PVD (peripheral vascular disease) ([place]) 10/05/2013   iliac stents   Past Surgical History:  Procedure Laterality Date  . ABDOMINAL HYSTERECTOMY    . CARDIAC SURGERY    . CARDIOVERSION    . CATARACT EXTRACTION Bilateral 2012  . CORONARY ARTERY BYPASS GRAFT  1985   x3  . ESOPHAGOGASTRODUODENOSCOPY N/A 07/09/2017   Procedure: ESOPHAGOGASTRODUODENOSCOPY (EGD);  Surgeon: Lin Landsman, MD;  Location: [facility] ENDOSCOPY;  Service: Gastroenterology;  Laterality: N/A;  . [place]   patient unsure of which side  . HIP PINNING,CANNULATED Right 05/19/2015   Procedure: CANNULATED HIP PINNING;  Surgeon: Hessie Knows, MD;  Location: [facility] ORS;  Service: Orthopedics;  Laterality: Right;    Allergies  Allergen Reactions  . Hydrocodone Other (See Comments)  . Oxycodone Hives    Outpatient Encounter Medications as of 07/30/2018  Medication Sig  . Amino Acids-Protein Hydrolys (FEEDING SUPPLEMENT, PRO-STAT SUGAR FREE 64,) LIQD Take 30 mLs by mouth 3 (three) times daily with meals.   Marland Kitchen aspirin EC 81 MG tablet Take 81 mg by mouth daily.   Marland Kitchen buPROPion (WELLBUTRIN) 75 MG tablet Take 75 mg by mouth daily.   Marland Kitchen docusate sodium (COLACE) 100 MG capsule Take 1 capsule (100 mg total) by mouth 2 (two) times daily.  . feeding supplement, ENSURE ENLIVE, (ENSURE ENLIVE) LIQD Take 237 mLs by mouth 2 (two) times daily. give with  a meal for nutrition  . guaiFENesin (ROBITUSSIN) 100 MG/5ML liquid Take 200 mg by mouth every 4 (four) hours as needed for cough.  . levalbuterol (XOPENEX) 1.25 MG/3ML nebulizer solution Take 1.25 mg by nebulization every 6 (six) hours as needed for wheezing.  Marland Kitchen levothyroxine (SYNTHROID, LEVOTHROID) 75 MCG tablet Take 1 tablet (75 mcg total) by mouth daily before breakfast.  . nitroGLYCERIN (NITROSTAT)  0.4 MG SL tablet Place 0.4 mg under the tongue every 5 (five) minutes as needed for chest pain.  . Nutritional Supplements (NUTRITIONAL SUPPLEMENT PO) Diet Type: Regular consistency, NAS  . ondansetron (ZOFRAN) 4 MG tablet Take 4 mg by mouth every 6 (six) hours as needed for nausea or vomiting.  . OXYGEN Inhale 2 L into the lungs continuous as needed.   . pantoprazole (PROTONIX) 40 MG tablet Take 1 tablet (40 mg total) by mouth 2 (two) times daily.  . polyethylene glycol (MIRALAX / GLYCOLAX) packet Take 17 g by mouth daily.  . potassium chloride SA (K-DUR,KLOR-CON) 20 MEQ tablet Take 40 mEq by mouth daily.  . sertraline (ZOLOFT) 100 MG tablet Take 100 mg by mouth daily.   . Skin Protectants, Misc. (ENDIT EX) Apply topically. Apply 1 application topically  PRN to peri-area/buttocks  . sodium chloride (OCEAN) 0.65 % nasal spray Place 2 sprays into the nose as needed for congestion.  Marland Kitchen Umeclidinium-Vilanterol (ANORO ELLIPTA) 62.5-25 MCG/INH AEPB Inhale 1 puff into the lungs daily.    No facility-administered encounter medications on file as of 07/30/2018.     Review of Systems  GENERAL: No change in appetite, no fatigue, no weight changes, no fever, chills or weakness MOUTH and THROAT: Denies oral discomfort, gingival pain or bleeding RESPIRATORY: no SOB, DOE, wheezing, hemoptysis, +cough CARDIAC: No chest pain, edema or palpitations GI: No abdominal pain, diarrhea, constipation, heart burn, nausea or vomiting GU: Denies dysuria, frequency, hematuria, or discharge NEUROLOGICAL: Denies dizziness, syncope, numbness, or headache PSYCHIATRIC: Denies feelings of depression or anxiety. No report of hallucinations, insomnia, paranoia, or agitation    Immunization History  Administered Date(s) Administered  . Influenza Split 03/31/2014  . Influenza, High Dose Seasonal PF 03/24/2017  . Influenza,inj,Quad PF,6+ Mos 05/20/2015  . Influenza-Unspecified 03/08/2018  . PPD Test 08/08/2017  .  Pneumococcal Conjugate-13 07/13/2018  . Pneumococcal Polysaccharide-23 03/31/2014   Pertinent  Health Maintenance Due  Topic Date Due  . INFLUENZA VACCINE  Completed  . PNA vac Low Risk Adult  Completed  . DEXA SCAN  Discontinued   Fall Risk  08/01/2017  Falls in the past year? No     Vitals:   07/30/18 1100  BP: 138/86  Pulse: 78  Resp: 17  Temp: 98.2 F (36.8 C)  TempSrc: Oral  SpO2: 94%  Weight: 95 lb 3.2 oz (43.2 kg)  Height: 5\' 1"  (1.549 m)   Body mass index is 17.99 kg/m.  Physical Exam  GENERAL APPEARANCE: In no acute distress. Normal body habitus SKIN:  Skin is warm and dry. There are no suspicious lesions or rash MOUTH and THROAT: Lips are without lesions. Oral mucosa is moist and without lesions. Tongue is normal in shape, size, and color and without lesions RESPIRATORY: Breathing is even & unlabored, BS CTAB CARDIAC: RRR, no murmur,no extra heart sounds, no edema GI: Abdomen soft, normal BS, no masses, no tenderness EXTREMITIES:  Able to move X 4 extremities NEUROLOGICAL: There is no tremor. Speech is clear. Alert to self, disoriented to time and place. PSYCHIATRIC:  Affect and behavior are  appropriate  Labs reviewed: Recent Labs    08/17/17 1535  07/13/18 1350 07/16/18 0455 07/19/18 0555  NA 139   < > 135 136 139  K 3.6   < > 2.8* 4.5 4.0  CL 101   < > 92* 101 103  CO2 27   < > 30 26 30   GLUCOSE 169*   < > 164* 93 96  BUN 43*   < > 52* 48* 29*  CREATININE 1.69*   < > 1.76* 1.56* 1.60*  CALCIUM 9.1   < > 8.7* 8.7* 8.7*  MG 2.2  --   --   --   --    < > = values in this interval not displayed.   Recent Labs    08/31/17 1208 03/06/18 0643 07/13/18 1350  AST 30 22 22   ALT 21 15 15   ALKPHOS 161* 108 82  BILITOT 0.6 0.7 0.5  PROT 6.8 6.4* 6.1*  ALBUMIN 3.3* 3.6 3.0*   Recent Labs    03/06/18 0643 07/13/18 1350 07/19/18 0555  WBC 7.1 21.7* 9.4  NEUTROABS 4.3 16.4* 6.8  HGB 13.4 12.6 12.2  HCT 38.3 38.8 38.5  MCV 89.5 88.2 91.4  PLT  180 245 258   Lab Results  Component Value Date   TSH 2.837 08/17/2017    Lab Results  Component Value Date   CHOL 239 (H) 08/17/2017   HDL 54 08/17/2017   LDLCALC 155 (H) 08/17/2017   TRIG 151 (H) 08/17/2017   CHOLHDL 4.4 08/17/2017    Assessment/Plan  1. Cough - will start guaifenesin 100 mg / 5 mL give 10 mL p.o. every 6 day, 2 PM and 10 P x1 week   Family/ staff Communication:Discussed plan of care with resident and son.  Labs/tests ordered:  None  Goals of care:   Long-term care.   Durenda Age, NP [facility] [phone] (Monday-Friday 8:00 a.m. - 5:00 p.m.) [phone] (after hours)

## 2018-08-06 ENCOUNTER — Encounter: Admission: RE | Admit: 2018-08-06 | Payer: Medicare HMO | Source: Ambulatory Visit | Admitting: Internal Medicine

## 2018-08-07 ENCOUNTER — Emergency Department: Payer: Medicare HMO

## 2018-08-07 ENCOUNTER — Other Ambulatory Visit: Payer: Self-pay

## 2018-08-07 ENCOUNTER — Emergency Department
Admission: EM | Admit: 2018-08-07 | Discharge: 2018-08-07 | Disposition: A | Discharge status: Skilled Nursing Facility | Payer: Medicare HMO | Attending: Emergency Medicine | Admitting: Emergency Medicine

## 2018-08-07 DIAGNOSIS — J449 Chronic obstructive pulmonary disease, unspecified: Secondary | ICD-10-CM | POA: Insufficient documentation

## 2018-08-07 DIAGNOSIS — I110 Hypertensive heart disease with heart failure: Secondary | ICD-10-CM | POA: Insufficient documentation

## 2018-08-07 DIAGNOSIS — R531 Weakness: Secondary | ICD-10-CM | POA: Diagnosis not present

## 2018-08-07 DIAGNOSIS — F0150 Vascular dementia without behavioral disturbance: Secondary | ICD-10-CM | POA: Insufficient documentation

## 2018-08-07 DIAGNOSIS — I259 Chronic ischemic heart disease, unspecified: Secondary | ICD-10-CM | POA: Insufficient documentation

## 2018-08-07 DIAGNOSIS — Z7401 Bed confinement status: Secondary | ICD-10-CM | POA: Diagnosis not present

## 2018-08-07 DIAGNOSIS — R410 Disorientation, unspecified: Secondary | ICD-10-CM | POA: Diagnosis not present

## 2018-08-07 DIAGNOSIS — I5032 Chronic diastolic (congestive) heart failure: Secondary | ICD-10-CM | POA: Insufficient documentation

## 2018-08-07 DIAGNOSIS — E039 Hypothyroidism, unspecified: Secondary | ICD-10-CM | POA: Diagnosis not present

## 2018-08-07 DIAGNOSIS — I10 Essential (primary) hypertension: Secondary | ICD-10-CM | POA: Diagnosis not present

## 2018-08-07 DIAGNOSIS — Z87891 Personal history of nicotine dependence: Secondary | ICD-10-CM | POA: Insufficient documentation

## 2018-08-07 DIAGNOSIS — R404 Transient alteration of awareness: Secondary | ICD-10-CM | POA: Diagnosis not present

## 2018-08-07 DIAGNOSIS — Z79899 Other long term (current) drug therapy: Secondary | ICD-10-CM | POA: Diagnosis not present

## 2018-08-07 DIAGNOSIS — M2550 Pain in unspecified joint: Secondary | ICD-10-CM | POA: Diagnosis not present

## 2018-08-07 LAB — CBC
HCT: 42.3 % (ref 36.0–46.0)
HEMOGLOBIN: 13.6 g/dL (ref 12.0–15.0)
MCH: 28.7 pg (ref 26.0–34.0)
MCHC: 32.2 g/dL (ref 30.0–36.0)
MCV: 89.2 fL (ref 80.0–100.0)
Platelets: 226 10*3/uL (ref 150–400)
RBC: 4.74 MIL/uL (ref 3.87–5.11)
RDW: 14.5 % (ref 11.5–15.5)
WBC: 5.5 10*3/uL (ref 4.0–10.5)
nRBC: 0 % (ref 0.0–0.2)

## 2018-08-07 LAB — URINALYSIS, COMPLETE (UACMP) WITH MICROSCOPIC
Bacteria, UA: NONE SEEN
Bilirubin Urine: NEGATIVE
Glucose, UA: NEGATIVE mg/dL
HGB URINE DIPSTICK: NEGATIVE
Ketones, ur: NEGATIVE mg/dL
Leukocytes,Ua: NEGATIVE
Nitrite: NEGATIVE
Protein, ur: 100 mg/dL — AB
Specific Gravity, Urine: 1.015 (ref 1.005–1.030)
pH: 7 (ref 5.0–8.0)

## 2018-08-07 LAB — BASIC METABOLIC PANEL
Anion gap: 10 (ref 5–15)
BUN: 40 mg/dL — ABNORMAL HIGH (ref 8–23)
CO2: 29 mmol/L (ref 22–32)
Calcium: 9.1 mg/dL (ref 8.9–10.3)
Chloride: 99 mmol/L (ref 98–111)
Creatinine, Ser: 1.62 mg/dL — ABNORMAL HIGH (ref 0.44–1.00)
GFR calc Af Amer: 33 mL/min — ABNORMAL LOW (ref >60)
GFR, EST NON AFRICAN AMERICAN: 28 mL/min — AB (ref >60)
GLUCOSE: 152 mg/dL — AB (ref 70–99)
Potassium: 3.6 mmol/L (ref 3.5–5.1)
Sodium: 138 mmol/L (ref 135–145)

## 2018-08-07 LAB — TROPONIN I
TROPONIN I: 0.05 ng/mL — AB (ref <0.03)
Troponin I: 0.05 ng/mL (ref <0.03)

## 2018-08-07 MED ORDER — SODIUM CHLORIDE 0.9% FLUSH: 3.0000 mL | Freq: Once | INTRAVENOUS | Status: DC

## 2018-08-07 NOTE — ED Notes (Signed)
Pt in NAD at time of departure, VSS, family verbalizes d/c understanding. PT unable to sign due to hx of dementia

## 2018-08-07 NOTE — ED Notes (Signed)
Date and time results received: 08/07/18 10:44 AM  (use smartphrase ".now" to insert current time)  Test: troponin Critical Value: 0.05  Name of Provider Notified: Dr. Charlotte Crumb

## 2018-08-07 NOTE — ED Notes (Signed)
Called [facility] for transport [phone]

## 2018-08-07 NOTE — ED Provider Notes (Signed)
[facility] Emergency Department Provider Note  ____________________________________________   I have reviewed the triage vital signs and the nursing notes. Where available I have reviewed prior notes and, if possible and indicated, outside hospital notes.    HISTORY  Chief Complaint Weakness    HPI Holly Clark is a 85 y.o. female who presents today complaining of nothing.  She states she feels fine.  According to the nursing home, she was leaning a little bit to the left this morning.  According to family she always leans to the last.  She states that she wants to go home.  Family states that they must of called for reason because they know she usually leans to the left and they asked me to do a "work-up".  I did call the nursing home and talk to the caretakers there, who stated that she seemed at her baseline but she was leaning more.  No history of numbness or weakness or other complaints.  Is at her baseline according to family.   Past Medical History:  Diagnosis Date  . Anxiety    unspecified  . Atrial fibrillation ([place])   . COPD (chronic obstructive pulmonary disease) ([place]) 10/05/2013  . Coronary artery disease 10/05/2013  . Dementia ([place])   . Depression   . Diastolic heart failure ([place])   . Hyperlipidemia, unspecified   . Hypertension 10/05/2013  . Hypothyroidism   . Hypothyroidism, acquired    unspecified  . PVD (peripheral vascular disease) ([place]) 10/05/2013   iliac stents    Patient Active Problem List   Diagnosis Date Noted  . HCAP (healthcare-associated pneumonia) 07/17/2018  . Hypokalemia 07/17/2018  . Coronary artery disease involving coronary bypass graft of native heart 01/14/2018  . Hypothyroidism due to acquired atrophy of thyroid 12/11/2017  . Chronic constipation 12/11/2017  . Depression with anxiety 12/11/2017  . Vascular dementia without behavioral disturbance ([place]) 12/11/2017  . Chronic right hip pain 12/11/2017  .  Failure to thrive in adult 12/11/2017  . Poor appetite 09/06/2017  . Chronic diastolic heart failure ([place]) 08/01/2017  . HTN (hypertension) 08/01/2017  . Atrial fibrillation ([place]) 08/01/2017  . COPD (chronic obstructive pulmonary disease) ([place]) 08/01/2017  . Esophagitis, erosive   . Iron deficiency anemia due to chronic blood loss   . [place] lesion, chronic   . Large hiatal hernia   . Hip fracture, right ([place]) 05/18/2015    Past Surgical History:  Procedure Laterality Date  . ABDOMINAL HYSTERECTOMY    . CARDIAC SURGERY    . CARDIOVERSION    . CATARACT EXTRACTION Bilateral 2012  . CORONARY ARTERY BYPASS GRAFT  1985   x3  . ESOPHAGOGASTRODUODENOSCOPY N/A 07/09/2017   Procedure: ESOPHAGOGASTRODUODENOSCOPY (EGD);  Surgeon: Lin Landsman, MD;  Location: [facility] ENDOSCOPY;  Service: Gastroenterology;  Laterality: N/A;  . [place]   patient unsure of which side  . HIP PINNING,CANNULATED Right 05/19/2015   Procedure: CANNULATED HIP PINNING;  Surgeon: Hessie Knows, MD;  Location: [facility] ORS;  Service: Orthopedics;  Laterality: Right;    Prior to Admission medications   Medication Sig Start Date End Date Taking? Authorizing Provider  Amino Acids-Protein Hydrolys (FEEDING SUPPLEMENT, PRO-STAT SUGAR FREE 64,) LIQD Take 30 mLs by mouth 3 (three) times daily with meals.  07/13/18   [provider]  aspirin EC 81 MG tablet Take 81 mg by mouth daily.     [provider]  buPROPion (WELLBUTRIN) 75 MG tablet Take 75 mg by mouth daily.  [provider]  docusate sodium (COLACE) 100 MG capsule Take 1 capsule (100 mg total) by mouth 2 (two) times daily. 05/22/15   Epifanio Lesches, MD  feeding supplement, ENSURE ENLIVE, (ENSURE ENLIVE) LIQD Take 237 mLs by mouth 2 (two) times daily. give with a meal for nutrition    [provider]  guaiFENesin (ROBITUSSIN) 100 MG/5ML liquid Take 200 mg by mouth every 4 (four) hours as needed for cough.     [provider]  levalbuterol Penne Lash) 1.25 MG/3ML nebulizer solution Take 1.25 mg by nebulization every 6 (six) hours as needed for wheezing.    [provider]  levothyroxine (SYNTHROID, LEVOTHROID) 75 MCG tablet Take 1 tablet (75 mcg total) by mouth daily before breakfast. 07/14/17   Vaughan Basta, MD  nitroGLYCERIN (NITROSTAT) 0.4 MG SL tablet Place 0.4 mg under the tongue every 5 (five) minutes as needed for chest pain.    [provider]  Nutritional Supplements (NUTRITIONAL SUPPLEMENT PO) Diet Type: Regular consistency, NAS 07/11/18   [provider]  ondansetron (ZOFRAN) 4 MG tablet Take 4 mg by mouth every 6 (six) hours as needed for nausea or vomiting. 07/13/18   [provider]  OXYGEN Inhale 2 L into the lungs continuous as needed.     [provider]  pantoprazole (PROTONIX) 40 MG tablet Take 1 tablet (40 mg total) by mouth 2 (two) times daily. 07/10/17   Vaughan Basta, MD  polyethylene glycol (MIRALAX / GLYCOLAX) packet Take 17 g by mouth daily. 03/22/18   [provider]  potassium chloride SA (K-DUR,KLOR-CON) 20 MEQ tablet Take 40 mEq by mouth daily. 07/14/18   [provider]  sertraline (ZOLOFT) 100 MG tablet Take 100 mg by mouth daily.  05/08/18   [provider]  Skin Protectants, Misc. (ENDIT EX) Apply topically. Apply 1 application topically  PRN to peri-area/buttocks    [provider]  sodium chloride (OCEAN) 0.65 % nasal spray Place 2 sprays into the nose as needed for congestion. 04/23/18   [provider]  Umeclidinium-Vilanterol (ANORO ELLIPTA) 62.5-25 MCG/INH AEPB Inhale 1 puff into the lungs daily.     [provider]    Allergies Hydrocodone and Oxycodone  Family History  Problem Relation Age of Onset  . Stroke Mother   . Cancer Sister   . Parkinsonism Brother     Social History Social History   Tobacco Use  . Smoking status: Former Smoker     Packs/day: 0.25    Years: 30.00    Pack years: 7.50    Types: Cigarettes    Last attempt to quit: 07/01/2017    Years since quitting: 1.1  . Smokeless tobacco: Never Used  Substance Use Topics  . Alcohol use: Yes    Comment: rare, glass of wine occasionally  . Drug use: No    Review of Systems Constitutional: No fever/chills Eyes: No visual changes. ENT: No sore throat. No stiff neck no neck pain Cardiovascular: Denies chest pain. Respiratory: Denies shortness of breath. Gastrointestinal:   no vomiting.  No diarrhea.  No constipation. Genitourinary: Negative for dysuria. Musculoskeletal: Negative lower extremity swelling Skin: Negative for rash. Neurological: Negative for severe headaches, focal weakness or numbness.   ____________________________________________   PHYSICAL EXAM:  VITAL SIGNS: ED Triage Vitals  Enc Vitals Group     BP 08/07/18 0935 (!) 185/94     Pulse Rate 08/07/18 0935 81     Resp 08/07/18 0935 20     Temp 08/07/18  0935 97.9 F (36.6 C)     Temp Source 08/07/18 0935 Oral     SpO2 08/07/18 0935 98 %     Weight 08/07/18 0936 95 lb 3.2 oz (43.2 kg)     Height 08/07/18 0936 5\' 1"  (1.549 m)     Head Circumference --      Peak Flow --      Pain Score --      Pain Loc --      Pain Edu? --      Excl. in [place]? --     Constitutional: Alert and oriented. Well appearing and in no acute distress. Eyes: Conjunctivae are normal Head: Atraumatic HEENT: No congestion/rhinnorhea. Mucous membranes are moist.  Oropharynx non-erythematous Neck:   Nontender with no meningismus, no masses, no stridor Cardiovascular: Normal rate, regular rhythm. Grossly normal heart sounds.  Good peripheral circulation. Respiratory: Normal respiratory effort.  No retractions. Lungs CTAB. Abdominal: Soft and nontender. No distention. No guarding no rebound Back:  There is no focal tenderness or step off.  there is no midline tenderness there are no lesions noted. there is no CVA  tenderness  Musculoskeletal: No lower extremity tenderness, no upper extremity tenderness. No joint effusions, no DVT signs strong distal pulses no edema Neurologic:  Normal speech and language. No gross focal neurologic deficits are appreciated.  Skin:  Skin is warm, dry and intact. No rash noted. Psychiatric: Mood and affect are normal. Speech and behavior are normal.  ____________________________________________   LABS (all labs ordered are listed, but only abnormal results are displayed)  Labs Reviewed  BASIC METABOLIC PANEL - Abnormal; Notable for the following components:      Result Value   Glucose, Bld 152 (*)    BUN 40 (*)    Creatinine, Ser 1.62 (*)    GFR calc non Af Amer 28 (*)    GFR calc Af Amer 33 (*)    All other components within normal limits  TROPONIN I - Abnormal; Notable for the following components:   Troponin I 0.05 (*)    All other components within normal limits  CBC  URINALYSIS, COMPLETE (UACMP) WITH MICROSCOPIC  TROPONIN I    Pertinent labs  results that were available during my care of the patient were reviewed by me and considered in my medical decision making (see chart for details). ____________________________________________  EKG  I personally interpreted any EKGs ordered by me or triage Sinus rhythm rate 70 bpm normal axis no acute ST elevation or depression some baseline waveform abnormalities limits exam ____________________________________________  RADIOLOGY  Pertinent labs & imaging results that were available during my care of the patient were reviewed by me and considered in my medical decision making (see chart for details). If possible, patient and/or family made aware of any abnormal findings.  Dg Chest 2 View  Result Date: 08/07/2018 CLINICAL DATA:  Weakness. New onset of leaning to the left this morning. Dementia. EXAM: CHEST - 2 VIEW COMPARISON:  Chest CT and chest x-ray dated 07/22/2017 FINDINGS: Heart size and vascularity are  normal.  CABG. The lungs are clear. Large hiatal hernia, chronic. Aortic atherosclerosis. No acute bone abnormality. IMPRESSION: 1. No acute abnormalities. 2.  Aortic Atherosclerosis (ICD10-I70.0). Electronically Signed   By: Lorriane Shire M.D.   On: 08/07/2018 10:53   Ct Head Wo Contrast  Result Date: 08/07/2018 CLINICAL DATA:  Left leaning and weakness.  History of dementia EXAM: CT HEAD WITHOUT CONTRAST TECHNIQUE: Contiguous axial images were obtained from the  base of the skull through the vertex without intravenous contrast. COMPARISON:  05/18/2015 FINDINGS: Brain: No evidence of acute infarction, hemorrhage, hydrocephalus, extra-axial collection or mass lesion/mass effect. Brain atrophy without specific pattern. Confluent chronic small vessel ischemic gliosis in the cerebral white matter. High-density at the foramen Monro is likely vascular calcification, stable. Vascular: Atherosclerotic calcification.  No hyperdense vessel. Skull: No acute or aggressive finding Sinuses/Orbits: Bilateral cataract resection. IMPRESSION: 1. No acute or reversible finding. 2. Atrophy and chronic small vessel ischemia. Electronically Signed   By: Monte Fantasia M.D.   On: 08/07/2018 10:55   ____________________________________________    PROCEDURES  Procedure(s) performed: None  Procedures  Critical Care performed: None  ____________________________________________   INITIAL IMPRESSION / ASSESSMENT AND PLAN / ED COURSE  Pertinent labs & imaging results that were available during my care of the patient were reviewed by me and considered in my medical decision making (see chart for details).  Here for "leaning to the left a little bit more than she normally used to the left.  Is very difficult to know how to work this up she has no obvious complaints, is elevated but she is angry about being here.  Work is reassuring family's request I am doing a work-up for this and checking multiple different things to  ensure that there was no CVA etc.  However, she is neurologically intact and if this is negative I will discharge her.  Her blood pressure is elevated but again she is angry and anxious about being here    ____________________________________________   FINAL CLINICAL IMPRESSION(S) / ED DIAGNOSES  Final diagnoses:  None      This chart was dictated using voice recognition software.  Despite best efforts to proofread,  errors can occur which can change meaning.      Schuyler Amor, MD 08/07/18 1136

## 2018-08-07 NOTE — ED Triage Notes (Signed)
To ER via [facility] from [facility] c/o "leaning to left" this morning when they saw her eating breakfast, leaning to left when bathing. Unknown LNW. Pt does not have any stroke like sx at this time or with EMS. Pt hx of dementia. CBG 140 with EMS, VSS.

## 2018-08-07 NOTE — ED Notes (Addendum)
EDP aware of elevated blood pressure and gives orders to continue with discharge back to facility with instructions to continue to monitor BP and followup with PCP. Attempt to call report to [facility] by this RN unsuccessful.     Pt refusing repeat temperature.

## 2018-08-09 ENCOUNTER — Encounter: Payer: Self-pay | Admitting: Adult Health

## 2018-08-09 ENCOUNTER — Non-Acute Institutional Stay (SKILLED_NURSING_FACILITY): Payer: Medicare HMO | Admitting: Adult Health

## 2018-08-09 DIAGNOSIS — E876 Hypokalemia: Secondary | ICD-10-CM | POA: Diagnosis not present

## 2018-08-09 DIAGNOSIS — J441 Chronic obstructive pulmonary disease with (acute) exacerbation: Secondary | ICD-10-CM | POA: Diagnosis not present

## 2018-08-09 DIAGNOSIS — E034 Atrophy of thyroid (acquired): Secondary | ICD-10-CM | POA: Diagnosis not present

## 2018-08-09 DIAGNOSIS — F0150 Vascular dementia without behavioral disturbance: Secondary | ICD-10-CM | POA: Diagnosis not present

## 2018-08-09 DIAGNOSIS — R627 Adult failure to thrive: Secondary | ICD-10-CM

## 2018-08-09 DIAGNOSIS — F321 Major depressive disorder, single episode, moderate: Secondary | ICD-10-CM | POA: Diagnosis not present

## 2018-08-09 NOTE — Progress Notes (Signed)
[facility]  [facility] Room Number: 892-J Place of Service:  SNF [phone]) Provider:  Durenda Age, NP  Patient Care Team: Kirk Ruths, MD as PCP - General (Internal Medicine) Gerlene Fee, NP as Nurse Practitioner (Geriatric Medicine)  Extended Emergency Contact Information Primary Emergency Contact: Drema Balzarine Address: [address]          [place], [place] [address] Home Phone: [phone] Mobile Phone: [phone] Relation: Daughter Secondary Emergency Contact: [place] Phone: [phone] Mobile Phone: [phone] Relation: Daughter  Code Status:  DNR  Goals of care: Advanced Directive information Advanced Directives 08/09/2018  Does Patient Have a Medical Advance Directive? Yes  Type of Advance Directive Out of facility DNR (pink MOST or yellow form)  Does patient want to make changes to medical advance directive? No - Patient declined  Copy of [place] in Chart? -  Would patient like information on creating a medical advance directive? -  Pre-existing out of facility DNR order (yellow form or pink MOST form) -     Chief Complaint  Patient presents with  . Medical Management of Chronic Issues    Routine [facility] visit    HPI:  Pt is a 85 y.o. female seen today for medical management of chronic diseases.  She is a long-term care resident of [facility] She has a PMH of atrial fibrillation, COPD, CAD, dementia, diastolic heart failure, hypothyroidism, HTN, anxiety, and HLD. She was recently sent to the ED due to her leaning more on the left. CT head was negative for acute findings and was sent back to facility. She was seen in her room today with son at bedside. She denies any pain. She takes Levothyroxine for hypothyroidism and last tsh 2.837 was taken 3/19 .   Past Medical History:  Diagnosis Date  . Anxiety    unspecified  . Atrial fibrillation ([place])   . COPD (chronic  obstructive pulmonary disease) ([place]) 10/05/2013  . Coronary artery disease 10/05/2013  . Dementia ([place])   . Depression   . Diastolic heart failure ([place])   . Hyperlipidemia, unspecified   . Hypertension 10/05/2013  . Hypothyroidism   . Hypothyroidism, acquired    unspecified  . PVD (peripheral vascular disease) ([place]) 10/05/2013   iliac stents   Past Surgical History:  Procedure Laterality Date  . ABDOMINAL HYSTERECTOMY    . CARDIAC SURGERY    . CARDIOVERSION    . CATARACT EXTRACTION Bilateral 2012  . CORONARY ARTERY BYPASS GRAFT  1985   x3  . ESOPHAGOGASTRODUODENOSCOPY N/A 07/09/2017   Procedure: ESOPHAGOGASTRODUODENOSCOPY (EGD);  Surgeon: Lin Landsman, MD;  Location: [facility] ENDOSCOPY;  Service: Gastroenterology;  Laterality: N/A;  . [place]   patient unsure of which side  . HIP PINNING,CANNULATED Right 05/19/2015   Procedure: CANNULATED HIP PINNING;  Surgeon: Hessie Knows, MD;  Location: [facility] ORS;  Service: Orthopedics;  Laterality: Right;    Allergies  Allergen Reactions  . Hydrocodone Other (See Comments)  . Oxycodone Hives    Outpatient Encounter Medications as of 08/09/2018  Medication Sig  . Amino Acids-Protein Hydrolys (FEEDING SUPPLEMENT, PRO-STAT SUGAR FREE 64,) LIQD Take 30 mLs by mouth 3 (three) times daily with meals.   Marland Kitchen aspirin EC 81 MG tablet Take 81 mg by mouth daily.   Marland Kitchen docusate sodium (COLACE) 100 MG capsule Take 1 capsule (100 mg total) by mouth 2 (two) times daily.  . feeding supplement, ENSURE ENLIVE, (ENSURE ENLIVE)  LIQD Take 237 mLs by mouth 2 (two) times daily. give with a meal for nutrition  . levalbuterol (XOPENEX) 1.25 MG/3ML nebulizer solution Take 1.25 mg by nebulization every 6 (six) hours as needed for wheezing.  Marland Kitchen levothyroxine (SYNTHROID, LEVOTHROID) 75 MCG tablet Take 1 tablet (75 mcg total) by mouth daily before breakfast.  . nitroGLYCERIN (NITROSTAT) 0.4 MG SL tablet Place 0.4 mg under the tongue every 5 (five) minutes  as needed for chest pain.  . Nutritional Supplements (NUTRITIONAL SUPPLEMENT PO) Diet Type: Regular consistency, NAS  . ondansetron (ZOFRAN) 4 MG tablet Take 4 mg by mouth every 6 (six) hours as needed for nausea or vomiting.  . OXYGEN Inhale 2 L into the lungs continuous as needed.   . pantoprazole (PROTONIX) 40 MG tablet Take 1 tablet (40 mg total) by mouth 2 (two) times daily.  . polyethylene glycol (MIRALAX / GLYCOLAX) packet Take 17 g by mouth daily.  . potassium chloride SA (K-DUR,KLOR-CON) 20 MEQ tablet Take 40 mEq by mouth daily. 2 tablets to = 40 mEq  . sertraline (ZOLOFT) 100 MG tablet Take 100 mg by mouth daily.   . Skin Protectants, Misc. (ENDIT EX) Apply topically. Apply 1 application topically  PRN to peri-area/buttocks  . sodium chloride (OCEAN) 0.65 % nasal spray Place 2 sprays into the nose as needed for congestion.  Marland Kitchen Umeclidinium-Vilanterol (ANORO ELLIPTA) 62.5-25 MCG/INH AEPB Inhale 1 puff into the lungs daily.   . [DISCONTINUED] buPROPion (WELLBUTRIN) 75 MG tablet Take 75 mg by mouth daily.   . [DISCONTINUED] guaiFENesin (ROBITUSSIN) 100 MG/5ML liquid Take 200 mg by mouth every 4 (four) hours as needed for cough.   No facility-administered encounter medications on file as of 08/09/2018.     Review of Systems  GENERAL: No change in appetite, no fatigue, no weight changes, no fever, chills or weakness MOUTH and THROAT: Denies oral discomfort, gingival pain or bleeding RESPIRATORY: no cough, SOB, DOE, wheezing, hemoptysis CARDIAC: No chest pain, edema or palpitations GI: No abdominal pain, diarrhea, constipation, heart burn, nausea or vomiting GU: Denies dysuria, frequency, hematuria, incontinence, or discharge NEUROLOGICAL: Denies dizziness, syncope, numbness, or headache PSYCHIATRIC: Denies feelings of depression or anxiety. No report of hallucinations, insomnia, paranoia, or agitation   Immunization History  Administered Date(s) Administered  . Influenza Split  03/31/2014  . Influenza, High Dose Seasonal PF 03/24/2017  . Influenza,inj,Quad PF,6+ Mos 05/20/2015  . Influenza-Unspecified 03/08/2018  . PPD Test 08/08/2017  . Pneumococcal Conjugate-13 07/13/2018  . Pneumococcal Polysaccharide-23 03/31/2014   Pertinent  Health Maintenance Due  Topic Date Due  . INFLUENZA VACCINE  Completed  . PNA vac Low Risk Adult  Completed  . DEXA SCAN  Discontinued   Fall Risk  08/01/2017  Falls in the past year? No     Vitals:   08/09/18 0932  BP: (!) 146/80  Pulse: 89  Resp: 17  Temp: 98.2 F (36.8 C)  TempSrc: Oral  SpO2: 97%  Weight: 96 lb 9.6 oz (43.8 kg)  Height: 5\' 1"  (1.549 m)   Body mass index is 18.25 kg/m.  Physical Exam  GENERAL APPEARANCE: In no acute distress.  SKIN:  Skin is warm and dry.  MOUTH and THROAT: Lips are without lesions. Oral mucosa is moist and without lesions. RESPIRATORY: Breathing is even & unlabored, BS CTAB CARDIAC: RRR, no murmur,no extra heart sounds, no edema GI: Abdomen soft, normal BS, no masses, no tenderness EXTREMITIES: Able to move X 4 extremities NEUROLOGICAL: There is no tremor. Speech is  clear. Alert to self, disoriented to time and place. PSYCHIATRIC:  Affect and behavior are appropriate   Labs reviewed: Recent Labs    08/17/17 1535  07/16/18 0455 07/19/18 0555 08/07/18 0941  NA 139   < > 136 139 138  K 3.6   < > 4.5 4.0 3.6  CL 101   < > 101 103 99  CO2 27   < > 26 30 29   GLUCOSE 169*   < > 93 96 152*  BUN 43*   < > 48* 29* 40*  CREATININE 1.69*   < > 1.56* 1.60* 1.62*  CALCIUM 9.1   < > 8.7* 8.7* 9.1  MG 2.2  --   --   --   --    < > = values in this interval not displayed.   Recent Labs    08/31/17 1208 03/06/18 0643 07/13/18 1350  AST 30 22 22   ALT 21 15 15   ALKPHOS 161* 108 82  BILITOT 0.6 0.7 0.5  PROT 6.8 6.4* 6.1*  ALBUMIN 3.3* 3.6 3.0*   Recent Labs    03/06/18 0643 07/13/18 1350 07/19/18 0555 08/07/18 0941  WBC 7.1 21.7* 9.4 5.5  NEUTROABS 4.3 16.4* 6.8   --   HGB 13.4 12.6 12.2 13.6  HCT 38.3 38.8 38.5 42.3  MCV 89.5 88.2 91.4 89.2  PLT 180 245 258 226   Lab Results  Component Value Date   TSH 2.837 08/17/2017    Lab Results  Component Value Date   CHOL 239 (H) 08/17/2017   HDL 54 08/17/2017   LDLCALC 155 (H) 08/17/2017   TRIG 151 (H) 08/17/2017   CHOLHDL 4.4 08/17/2017    Significant Diagnostic Results in last 30 days:  Dg Chest 2 View  Result Date: 08/07/2018 CLINICAL DATA:  Weakness. New onset of leaning to the left this morning. Dementia. EXAM: CHEST - 2 VIEW COMPARISON:  Chest CT and chest x-ray dated 07/22/2017 FINDINGS: Heart size and vascularity are normal.  CABG. The lungs are clear. Large hiatal hernia, chronic. Aortic atherosclerosis. No acute bone abnormality. IMPRESSION: 1. No acute abnormalities. 2.  Aortic Atherosclerosis (ICD10-I70.0). Electronically Signed   By: Lorriane Shire M.D.   On: 08/07/2018 10:53   Ct Head Wo Contrast  Result Date: 08/07/2018 CLINICAL DATA:  Left leaning and weakness.  History of dementia EXAM: CT HEAD WITHOUT CONTRAST TECHNIQUE: Contiguous axial images were obtained from the base of the skull through the vertex without intravenous contrast. COMPARISON:  05/18/2015 FINDINGS: Brain: No evidence of acute infarction, hemorrhage, hydrocephalus, extra-axial collection or mass lesion/mass effect. Brain atrophy without specific pattern. Confluent chronic small vessel ischemic gliosis in the cerebral white matter. High-density at the foramen Monro is likely vascular calcification, stable. Vascular: Atherosclerotic calcification.  No hyperdense vessel. Skull: No acute or aggressive finding Sinuses/Orbits: Bilateral cataract resection. IMPRESSION: 1. No acute or reversible finding. 2. Atrophy and chronic small vessel ischemia. Electronically Signed   By: Monte Fantasia M.D.   On: 08/07/2018 10:55    Assessment/Plan  1. Hypothyroidism due to acquired atrophy of thyroid -Continue levothyroxine 75 mcg 1  tab daily, check TSH level  2. Failure to thrive in adult Last Weight  Most recent update: 08/09/2018 12:23 PM   Weight  43.8 kg (96 lb 9.6 oz)          Body mass index is 18.25 kg/m. -Continue Ensure twice a day and pro-stat sugar-free 15-100 g-kcal/30 mL 3 times a day   3. Chronic obstructive pulmonary  disease with acute exacerbation ([place]) -No wheezing, continue Xopenex as needed  4. Hypokalemia Lab Results  Component Value Date   K 3.6 08/07/2018  -Continue KCl ER 20 meq 2 tabs = 40 meq daily   5. Depression, major, single episode, moderate (HCC) -Stable, continue sertraline 100 mg 1 tab daily  6. Vascular dementia without behavioral disturbance ([place]) -Continue supportive care, fall precautions   Family/ staff Communication: Discussed plan of care with resident and son.  Labs/tests ordered: tsh  Goals of care:   Long-term care.   Durenda Age, NP [facility] and Adult Medicine [phone] (Monday-Friday 8:00 a.m. - 5:00 p.m.) [phone] (after hours)

## 2018-08-10 ENCOUNTER — Other Ambulatory Visit
Admission: RE | Admit: 2018-08-10 | Discharge: 2018-08-10 | Disposition: A | Discharge status: Home / Self Care | Payer: Medicare HMO | Source: Ambulatory Visit | Attending: Adult Health | Admitting: Adult Health

## 2018-08-10 DIAGNOSIS — E039 Hypothyroidism, unspecified: Secondary | ICD-10-CM | POA: Insufficient documentation

## 2018-08-10 LAB — TSH: TSH: 4.105 u[IU]/mL (ref 0.350–4.500)

## 2018-09-04 ENCOUNTER — Encounter: Payer: Self-pay | Admitting: Adult Health

## 2018-09-04 ENCOUNTER — Non-Acute Institutional Stay (SKILLED_NURSING_FACILITY): Payer: Medicare HMO | Admitting: Adult Health

## 2018-09-04 DIAGNOSIS — E876 Hypokalemia: Secondary | ICD-10-CM | POA: Diagnosis not present

## 2018-09-04 DIAGNOSIS — R627 Adult failure to thrive: Secondary | ICD-10-CM

## 2018-09-04 DIAGNOSIS — E034 Atrophy of thyroid (acquired): Secondary | ICD-10-CM | POA: Diagnosis not present

## 2018-09-04 DIAGNOSIS — I25810 Atherosclerosis of coronary artery bypass graft(s) without angina pectoris: Secondary | ICD-10-CM | POA: Diagnosis not present

## 2018-09-04 DIAGNOSIS — K5909 Other constipation: Secondary | ICD-10-CM | POA: Diagnosis not present

## 2018-09-04 DIAGNOSIS — F418 Other specified anxiety disorders: Secondary | ICD-10-CM

## 2018-09-04 DIAGNOSIS — F0150 Vascular dementia without behavioral disturbance: Secondary | ICD-10-CM | POA: Diagnosis not present

## 2018-09-04 DIAGNOSIS — J438 Other emphysema: Secondary | ICD-10-CM | POA: Diagnosis not present

## 2018-09-04 NOTE — Progress Notes (Signed)
[facility]  [facility] [place] Number: [place] of Service:  SNF ([phone]) Provider:  Durenda Age, NP  Patient Care Team: Kirk Ruths, MD as PCP - General (Internal Medicine) Gerlene Fee, NP as Nurse Practitioner (Geriatric Medicine)  Extended Emergency Contact Information Primary Emergency Contact: Drema Balzarine Address: [address]          [place], [place] [address] Home Phone: [phone] Mobile Phone: [phone] Relation: Daughter Secondary Emergency Contact: [place] Phone: [phone] Mobile Phone: [phone] Relation: Daughter  Code Status:  DNR  Goals of care: Advanced Directive information Advanced Directives 09/04/2018  Does Patient Have a Medical Advance Directive? Yes  Type of Paramedic of [place];Out of facility DNR (pink MOST or yellow form);Living will  Does patient want to make changes to medical advance directive? No - Patient declined  Copy of [place] in Chart? Yes - validated most recent copy scanned in chart (See row information)  Would patient like information on creating a medical advance directive? -  Pre-existing out of facility DNR order (yellow form or pink MOST form) Yellow form placed in chart (order not valid for inpatient use)     Chief Complaint  Patient presents with  . Discharge Note    Discharging from Facility 09/06/2018    HPI:  Pt is a 85 y.o. [place] seen today for discharge. She will discharge to [facility].  She has been admitted to [facility] on 07/26/17 and has been a long-term care resident. She has PMH of COPD, atherosclerosis of coronary artery bypass graft without angina pectoris, hypertensive heart and chronic kidney disease with heart failure, PAF, PVD and Dementia.   Past Medical History:  Diagnosis Date  . Anxiety    unspecified  . Atrial fibrillation ([place])   . COPD (chronic obstructive pulmonary  disease) ([place]) 10/05/2013  . Coronary artery disease 10/05/2013  . Dementia ([place])   . Depression   . Diastolic heart failure ([place])   . Hyperlipidemia, unspecified   . Hypertension 10/05/2013  . Hypothyroidism   . Hypothyroidism, acquired    unspecified  . PVD (peripheral vascular disease) ([place]) 10/05/2013   iliac stents   Past Surgical History:  Procedure Laterality Date  . ABDOMINAL HYSTERECTOMY    . CARDIAC SURGERY    . CARDIOVERSION    . CATARACT EXTRACTION Bilateral 2012  . CORONARY ARTERY BYPASS GRAFT  1985   x3  . ESOPHAGOGASTRODUODENOSCOPY N/A 07/09/2017   Procedure: ESOPHAGOGASTRODUODENOSCOPY (EGD);  Surgeon: Lin Landsman, MD;  Location: [facility] ENDOSCOPY;  Service: Gastroenterology;  Laterality: N/A;  . [place]   patient unsure of which side  . HIP PINNING,CANNULATED Right 05/19/2015   Procedure: CANNULATED HIP PINNING;  Surgeon: Hessie Knows, MD;  Location: [facility] ORS;  Service: Orthopedics;  Laterality: Right;    Allergies  Allergen Reactions  . Hydrocodone Other (See Comments)  . Oxycodone Hives    Outpatient Encounter Medications as of 09/04/2018  Medication Sig  . Amino Acids-Protein Hydrolys (FEEDING SUPPLEMENT, PRO-STAT SUGAR FREE 64,) LIQD Take 30 mLs by mouth 3 (three) times daily with meals.   Marland Kitchen aspirin EC 81 MG tablet Take 81 mg by mouth daily.   Marland Kitchen docusate sodium (COLACE) 100 MG capsule Take 1 capsule (100 mg total) by mouth 2 (two) times daily.  . feeding supplement, ENSURE ENLIVE, (ENSURE ENLIVE) LIQD Take 237 mLs by mouth 2 (two) times daily. give with a meal for nutrition  .  levalbuterol (XOPENEX) 1.25 MG/3ML nebulizer solution Take 1.25 mg by nebulization every [place] (six) hours as needed for wheezing.  Marland Kitchen levothyroxine (SYNTHROID, LEVOTHROID) 75 MCG tablet Take 1 tablet (75 mcg total) by mouth daily before breakfast.  . nitroGLYCERIN (NITROSTAT) 0.4 MG SL tablet Place 0.4 mg under the tongue every 5 (five) minutes as needed for chest  pain.  . Nutritional Supplements (NUTRITIONAL SUPPLEMENT PO) Diet Type: Regular consistency, NAS  . ondansetron (ZOFRAN) 4 MG tablet Take 4 mg by mouth every [place] (six) hours as needed for nausea or vomiting.  . OXYGEN Inhale 2 L into the lungs continuous as needed.   . pantoprazole (PROTONIX) 40 MG tablet Take 1 tablet (40 mg total) by mouth 2 (two) times daily.  . polyethylene glycol (MIRALAX / GLYCOLAX) packet Take 17 g by mouth daily.  . potassium chloride SA (K-DUR,KLOR-CON) 20 MEQ tablet Take 40 mEq by mouth daily. 2 tablets to = 40 mEq  . sertraline (ZOLOFT) 100 MG tablet Take 100 mg by mouth daily.   . Skin Protectants, Misc. (ENDIT EX) Apply topically. Apply 1 application topically  PRN to peri-area/buttocks  . sodium chloride (OCEAN) 0.65 % nasal spray Place 2 sprays into the nose as needed for congestion.  Marland Kitchen Umeclidinium-Vilanterol (ANORO ELLIPTA) 62.5-25 MCG/INH AEPB Inhale 1 puff into the lungs daily.    No facility-administered encounter medications on file as of 09/04/2018.     Review of Systems  GENERAL: No change in appetite, no fatigue, no weight changes, no fever, chills or weakness MOUTH and THROAT: Denies oral discomfort, gingival pain or bleeding RESPIRATORY: no cough, SOB, DOE, wheezing, hemoptysis CARDIAC: No chest pain, edema or palpitations GI: No abdominal pain, diarrhea, constipation, heart burn, nausea or vomiting NEUROLOGICAL: Denies dizziness, syncope, numbness, or headache PSYCHIATRIC: Denies feelings of depression or anxiety. No report of hallucinations, insomnia, paranoia, or agitation   Immunization History  Administered Date(s) Administered  . Influenza Split 03/31/2014  . Influenza, High Dose Seasonal PF 03/24/2017  . Influenza,inj,Quad PF,[place]+ Mos 05/20/2015  . Influenza-Unspecified 03/08/2018  . PPD Test 08/08/2017  . Pneumococcal Conjugate-13 07/13/2018  . Pneumococcal Polysaccharide-23 03/31/2014   Pertinent  Health Maintenance Due  Topic Date  Due  . INFLUENZA VACCINE  Completed  . PNA vac Low Risk Adult  Completed  . DEXA SCAN  Discontinued   Fall Risk  08/01/2017  Falls in the past year? No     Vitals:   09/04/18 1320  BP: (!) 132/105  Pulse: (!) 113  Resp: 18  Temp: (!) 97.3 F (36.3 C)  TempSrc: Oral  SpO2: 99%  Weight: 96 lb 9.Holly oz (43.8 kg)  Height: 5\' 1"  (1.549 m)   Body mass index is 18.25 kg/m.  Physical Exam  GENERAL APPEARANCE:  In no acute distress.  SKIN:  Skin is warm and dry.  MOUTH and THROAT: Lips are without lesions. Oral mucosa is moist and without lesions. Tongue is normal in shape, size, and color and without lesions RESPIRATORY: Breathing is even & unlabored, BS CTAB CARDIAC: RRR, no murmur,no extra heart sounds, no edema GI: Abdomen soft, normal BS, no masses, no tenderness EXTREMITIES:  Able to move X 4 extremities NEUROLOGICAL: There is no tremor. Speech is clear. Alert to self, disoriented to time and place.   PSYCHIATRIC:  Affect and behavior are appropriate   Labs reviewed: Recent Labs    07/16/18 0455 07/19/18 0555 08/07/18 0941  NA 136 139 138  K 4.5 4.0 3.Holly  CL 101 103  99  CO2 26 30 29   GLUCOSE 93 96 152*  BUN 48* 29* 40*  CREATININE 1.56* 1.60* 1.62*  CALCIUM 8.7* 8.7* 9.1   Recent Labs    03/06/18 0643 07/13/18 1350  AST 22 22  ALT 15 15  ALKPHOS 108 82  BILITOT 0.7 0.5  PROT Holly.4* Holly.1*  ALBUMIN 3.H[place] 3.0*   Recent Labs    03/06/18 0643 07/13/18 1350 07/19/18 0555 08/07/18 0941  WBC 7.1 21.7* 9.4 5.5  NEUTROABS 4.3 16.4* Holly.8  --   HGB 13.4 12.Holly 12.2 13.Holly  HCT 38.3 38.8 38.5 42.3  MCV 89.5 88.2 91.4 89.2  PLT 180 245 258 226   Lab Results  Component Value Date   TSH 4.105 08/10/2018   No results found for: HGBA1C Lab Results  Component Value Date   CHOL 239 (H) 08/17/2017   HDL 54 08/17/2017   LDLCALC 155 (H) 08/17/2017   TRIG 151 (H) 08/17/2017   CHOLHDL 4.4 08/17/2017    Significant Diagnostic Results in last 30 days:  Dg Chest 2 View   Result Date: 08/07/2018 CLINICAL DATA:  Weakness. New onset of leaning to the left this morning. Dementia. EXAM: CHEST - 2 VIEW COMPARISON:  Chest CT and chest x-ray dated 07/22/2017 FINDINGS: Heart size and vascularity are normal.  CABG. The lungs are clear. Large hiatal hernia, chronic. Aortic atherosclerosis. No acute bone abnormality. IMPRESSION: 1. No acute abnormalities. 2.  Aortic Atherosclerosis (ICD10-I70.0). Electronically Signed   By: Lorriane Shire M.D.   On: 08/07/2018 10:53   Ct Head Wo Contrast  Result Date: 08/07/2018 CLINICAL DATA:  Left leaning and weakness.  History of dementia EXAM: CT HEAD WITHOUT CONTRAST TECHNIQUE: Contiguous axial images were obtained from the base of the skull through the vertex without intravenous contrast. COMPARISON:  05/18/2015 FINDINGS: Brain: No evidence of acute infarction, hemorrhage, hydrocephalus, extra-axial collection or mass lesion/mass effect. Brain atrophy without specific pattern. Confluent chronic small vessel ischemic gliosis in the cerebral white matter. High-density at the foramen Monro is likely vascular calcification, stable. Vascular: Atherosclerotic calcification.  No hyperdense vessel. Skull: No acute or aggressive finding Sinuses/Orbits: Bilateral cataract resection. IMPRESSION: 1. No acute or reversible finding. 2. Atrophy and chronic small vessel ischemia. Electronically Signed   By: [place] Fantasia M.D.   On: 08/07/2018 10:55    Assessment/Plan  1. Other emphysema ([place]) -No SOB nor wheezing, continue Anoro Ellipta 62.5-25 mcg/actuation inhale 1 puff daily, Xopenex as needed  2. Hypothyroidism due to acquired atrophy of thyroid Lab Results  Component Value Date   TSH 4.105 08/10/2018  -Continue levothyroxine 75 mcg 1 tab daily   3. Hypokalemia Lab Results  Component Value Date   K 3.[place] 08/07/2018  -Continue KCl ER 20 meq  2 tabs = 40 meq daily  4. Coronary artery disease involving coronary bypass graft of native heart  without angina pectoris -Stable, continue aspirin EC 81 mg 1 tab NTG as needed  5. Failure to thrive in adult Weight: 96 lb 9.Holly oz (43.8 kg) Body mass index is 18.25 kg/m. -Continue Ensure Enlive twice a day, pro-stat sugar-free 30 mL 3 times a day   [place]. Depression with anxiety -Mood is stable, continue sertraline 100 mg 1 tablet daily  7. Chronic constipation -Continue MiraLAX 17 g daily, Colace 100 mg 1 capsule twice a day  8. Vascular dementia without behavioral disturbance ([place]) -Continue supportive care, fall precautions    I have filled out patient's discharge paperwork.  DME provided: None  Total  discharge time: Greater than 30 minutes Greater than 50% was spent in counseling and coordination of care.   Discharge time involved coordination of the discharge process with Education officer, museum and nursing staff.    Durenda Age, NP [facility] and Adult Medicine [phone] (Monday-Friday 8:00 a.m. - 5:00 p.m.) [phone] (after hours)

## 2018-09-05 ENCOUNTER — Encounter
Admission: RE | Admit: 2018-09-05 | Discharge: 2018-09-05 | Disposition: A | Discharge status: Home / Self Care | Payer: Medicare HMO | Source: Ambulatory Visit | Attending: Internal Medicine | Admitting: Internal Medicine

## 2018-09-05 DIAGNOSIS — E559 Vitamin D deficiency, unspecified: Secondary | ICD-10-CM | POA: Insufficient documentation

## 2018-09-05 DIAGNOSIS — D649 Anemia, unspecified: Secondary | ICD-10-CM | POA: Insufficient documentation

## 2018-09-11 DIAGNOSIS — F0150 Vascular dementia without behavioral disturbance: Secondary | ICD-10-CM | POA: Diagnosis not present

## 2018-09-11 DIAGNOSIS — N184 Chronic kidney disease, stage 4 (severe): Secondary | ICD-10-CM | POA: Diagnosis not present

## 2018-09-11 DIAGNOSIS — I5032 Chronic diastolic (congestive) heart failure: Secondary | ICD-10-CM | POA: Diagnosis not present

## 2018-09-11 DIAGNOSIS — F39 Unspecified mood [affective] disorder: Secondary | ICD-10-CM

## 2018-09-11 DIAGNOSIS — J439 Emphysema, unspecified: Secondary | ICD-10-CM

## 2018-09-11 DIAGNOSIS — I739 Peripheral vascular disease, unspecified: Secondary | ICD-10-CM | POA: Diagnosis not present

## 2018-09-11 DIAGNOSIS — I700 Atherosclerosis of aorta: Secondary | ICD-10-CM

## 2018-09-11 DIAGNOSIS — I480 Paroxysmal atrial fibrillation: Secondary | ICD-10-CM | POA: Diagnosis not present

## 2018-09-22 ENCOUNTER — Other Ambulatory Visit: Payer: Self-pay

## 2018-09-22 ENCOUNTER — Emergency Department: Payer: Medicare HMO

## 2018-09-22 ENCOUNTER — Inpatient Hospital Stay
Admission: EM | Admit: 2018-09-22 | Discharge: 2018-09-26 | DRG: 291 | Disposition: A | Discharge status: Skilled Nursing Facility | Payer: Medicare HMO | Attending: Internal Medicine | Admitting: Internal Medicine

## 2018-09-22 DIAGNOSIS — Z20828 Contact with and (suspected) exposure to other viral communicable diseases: Secondary | ICD-10-CM | POA: Diagnosis present

## 2018-09-22 DIAGNOSIS — Z7189 Other specified counseling: Secondary | ICD-10-CM | POA: Diagnosis not present

## 2018-09-22 DIAGNOSIS — J9621 Acute and chronic respiratory failure with hypoxia: Secondary | ICD-10-CM | POA: Diagnosis not present

## 2018-09-22 DIAGNOSIS — E876 Hypokalemia: Secondary | ICD-10-CM | POA: Diagnosis present

## 2018-09-22 DIAGNOSIS — J9601 Acute respiratory failure with hypoxia: Secondary | ICD-10-CM | POA: Diagnosis not present

## 2018-09-22 DIAGNOSIS — F0390 Unspecified dementia without behavioral disturbance: Secondary | ICD-10-CM | POA: Diagnosis present

## 2018-09-22 DIAGNOSIS — D631 Anemia in chronic kidney disease: Secondary | ICD-10-CM | POA: Diagnosis present

## 2018-09-22 DIAGNOSIS — Z7902 Long term (current) use of antithrombotics/antiplatelets: Secondary | ICD-10-CM

## 2018-09-22 DIAGNOSIS — R6510 Systemic inflammatory response syndrome (SIRS) of non-infectious origin without acute organ dysfunction: Secondary | ICD-10-CM | POA: Diagnosis not present

## 2018-09-22 DIAGNOSIS — I499 Cardiac arrhythmia, unspecified: Secondary | ICD-10-CM | POA: Diagnosis not present

## 2018-09-22 DIAGNOSIS — Z9841 Cataract extraction status, right eye: Secondary | ICD-10-CM

## 2018-09-22 DIAGNOSIS — Z7401 Bed confinement status: Secondary | ICD-10-CM | POA: Diagnosis not present

## 2018-09-22 DIAGNOSIS — B952 Enterococcus as the cause of diseases classified elsewhere: Secondary | ICD-10-CM | POA: Diagnosis present

## 2018-09-22 DIAGNOSIS — R32 Unspecified urinary incontinence: Secondary | ICD-10-CM | POA: Diagnosis present

## 2018-09-22 DIAGNOSIS — I4891 Unspecified atrial fibrillation: Secondary | ICD-10-CM | POA: Diagnosis not present

## 2018-09-22 DIAGNOSIS — E039 Hypothyroidism, unspecified: Secondary | ICD-10-CM | POA: Diagnosis present

## 2018-09-22 DIAGNOSIS — Z9842 Cataract extraction status, left eye: Secondary | ICD-10-CM

## 2018-09-22 DIAGNOSIS — Z515 Encounter for palliative care: Secondary | ICD-10-CM | POA: Diagnosis not present

## 2018-09-22 DIAGNOSIS — E782 Mixed hyperlipidemia: Secondary | ICD-10-CM | POA: Diagnosis present

## 2018-09-22 DIAGNOSIS — N184 Chronic kidney disease, stage 4 (severe): Secondary | ICD-10-CM | POA: Diagnosis not present

## 2018-09-22 DIAGNOSIS — I480 Paroxysmal atrial fibrillation: Secondary | ICD-10-CM | POA: Diagnosis not present

## 2018-09-22 DIAGNOSIS — J9600 Acute respiratory failure, unspecified whether with hypoxia or hypercapnia: Secondary | ICD-10-CM | POA: Diagnosis not present

## 2018-09-22 DIAGNOSIS — Z7982 Long term (current) use of aspirin: Secondary | ICD-10-CM

## 2018-09-22 DIAGNOSIS — Z885 Allergy status to narcotic agent status: Secondary | ICD-10-CM

## 2018-09-22 DIAGNOSIS — Z79899 Other long term (current) drug therapy: Secondary | ICD-10-CM

## 2018-09-22 DIAGNOSIS — I248 Other forms of acute ischemic heart disease: Secondary | ICD-10-CM | POA: Diagnosis present

## 2018-09-22 DIAGNOSIS — R7989 Other specified abnormal findings of blood chemistry: Secondary | ICD-10-CM | POA: Diagnosis not present

## 2018-09-22 DIAGNOSIS — I5033 Acute on chronic diastolic (congestive) heart failure: Secondary | ICD-10-CM | POA: Diagnosis present

## 2018-09-22 DIAGNOSIS — R0602 Shortness of breath: Secondary | ICD-10-CM | POA: Diagnosis not present

## 2018-09-22 DIAGNOSIS — R000 Tachycardia, unspecified: Secondary | ICD-10-CM | POA: Diagnosis not present

## 2018-09-22 DIAGNOSIS — N3000 Acute cystitis without hematuria: Secondary | ICD-10-CM | POA: Diagnosis not present

## 2018-09-22 DIAGNOSIS — I130 Hypertensive heart and chronic kidney disease with heart failure and stage 1 through stage 4 chronic kidney disease, or unspecified chronic kidney disease: Principal | ICD-10-CM | POA: Diagnosis present

## 2018-09-22 DIAGNOSIS — F419 Anxiety disorder, unspecified: Secondary | ICD-10-CM | POA: Diagnosis present

## 2018-09-22 DIAGNOSIS — Z681 Body mass index (BMI) 19 or less, adult: Secondary | ICD-10-CM

## 2018-09-22 DIAGNOSIS — N179 Acute kidney failure, unspecified: Secondary | ICD-10-CM | POA: Diagnosis present

## 2018-09-22 DIAGNOSIS — Z9981 Dependence on supplemental oxygen: Secondary | ICD-10-CM

## 2018-09-22 DIAGNOSIS — I739 Peripheral vascular disease, unspecified: Secondary | ICD-10-CM | POA: Diagnosis present

## 2018-09-22 DIAGNOSIS — F329 Major depressive disorder, single episode, unspecified: Secondary | ICD-10-CM | POA: Diagnosis present

## 2018-09-22 DIAGNOSIS — Z90710 Acquired absence of both cervix and uterus: Secondary | ICD-10-CM

## 2018-09-22 DIAGNOSIS — Z66 Do not resuscitate: Secondary | ICD-10-CM | POA: Diagnosis present

## 2018-09-22 DIAGNOSIS — J441 Chronic obstructive pulmonary disease with (acute) exacerbation: Secondary | ICD-10-CM | POA: Diagnosis not present

## 2018-09-22 DIAGNOSIS — Z87891 Personal history of nicotine dependence: Secondary | ICD-10-CM

## 2018-09-22 DIAGNOSIS — E46 Unspecified protein-calorie malnutrition: Secondary | ICD-10-CM | POA: Diagnosis not present

## 2018-09-22 DIAGNOSIS — I2510 Atherosclerotic heart disease of native coronary artery without angina pectoris: Secondary | ICD-10-CM | POA: Diagnosis present

## 2018-09-22 DIAGNOSIS — M2550 Pain in unspecified joint: Secondary | ICD-10-CM | POA: Diagnosis not present

## 2018-09-22 DIAGNOSIS — I5032 Chronic diastolic (congestive) heart failure: Secondary | ICD-10-CM | POA: Diagnosis not present

## 2018-09-22 DIAGNOSIS — Z794 Long term (current) use of insulin: Secondary | ICD-10-CM

## 2018-09-22 DIAGNOSIS — R069 Unspecified abnormalities of breathing: Secondary | ICD-10-CM | POA: Diagnosis not present

## 2018-09-22 DIAGNOSIS — Z951 Presence of aortocoronary bypass graft: Secondary | ICD-10-CM

## 2018-09-22 DIAGNOSIS — I2489 Other forms of acute ischemic heart disease: Secondary | ICD-10-CM

## 2018-09-22 DIAGNOSIS — R05 Cough: Secondary | ICD-10-CM | POA: Diagnosis not present

## 2018-09-22 DIAGNOSIS — R0989 Other specified symptoms and signs involving the circulatory and respiratory systems: Secondary | ICD-10-CM | POA: Diagnosis not present

## 2018-09-22 LAB — COMPREHENSIVE METABOLIC PANEL
ALT: 16 U/L (ref 0–44)
AST: 27 U/L (ref 15–41)
Albumin: 3.7 g/dL (ref 3.5–5.0)
Alkaline Phosphatase: 122 U/L (ref 38–126)
Anion gap: 13 (ref 5–15)
BUN: 59 mg/dL — ABNORMAL HIGH (ref 8–23)
CO2: 28 mmol/L (ref 22–32)
Calcium: 9.1 mg/dL (ref 8.9–10.3)
Chloride: 101 mmol/L (ref 98–111)
Creatinine, Ser: 1.64 mg/dL — ABNORMAL HIGH (ref 0.44–1.00)
GFR calc Af Amer: 32 mL/min — ABNORMAL LOW (ref >60)
GFR calc non Af Amer: 28 mL/min — ABNORMAL LOW (ref >60)
Glucose, Bld: 183 mg/dL — ABNORMAL HIGH (ref 70–99)
Potassium: 3.2 mmol/L — ABNORMAL LOW (ref 3.5–5.1)
Sodium: 142 mmol/L (ref 135–145)
Total Bilirubin: 0.8 mg/dL (ref 0.3–1.2)
Total Protein: 7.4 g/dL (ref 6.5–8.1)

## 2018-09-22 LAB — CBC WITH DIFFERENTIAL/PLATELET
Abs Immature Granulocytes: 0.1 10*3/uL — ABNORMAL HIGH (ref 0.00–0.07)
Basophils Absolute: 0 10*3/uL (ref 0.0–0.1)
Basophils Relative: 0 %
Eosinophils Absolute: 0.2 10*3/uL (ref 0.0–0.5)
Eosinophils Relative: 1 %
HCT: 37.6 % (ref 36.0–46.0)
Hemoglobin: 12.2 g/dL (ref 12.0–15.0)
Immature Granulocytes: 1 %
Lymphocytes Relative: 6 %
Lymphs Abs: 0.8 10*3/uL (ref 0.7–4.0)
MCH: 29.3 pg (ref 26.0–34.0)
MCHC: 32.4 g/dL (ref 30.0–36.0)
MCV: 90.2 fL (ref 80.0–100.0)
Monocytes Absolute: 3.6 10*3/uL — ABNORMAL HIGH (ref 0.1–1.0)
Monocytes Relative: 25 %
Neutro Abs: 9.9 10*3/uL — ABNORMAL HIGH (ref 1.7–7.7)
Neutrophils Relative %: 67 %
Platelets: 283 10*3/uL (ref 150–400)
RBC: 4.17 MIL/uL (ref 3.87–5.11)
RDW: 14.2 % (ref 11.5–15.5)
Smear Review: NORMAL
WBC: 14.6 10*3/uL — ABNORMAL HIGH (ref 4.0–10.5)
nRBC: 0 % (ref 0.0–0.2)

## 2018-09-22 LAB — BLOOD GAS, VENOUS
Acid-Base Excess: 9.7 mmol/L — ABNORMAL HIGH (ref 0.0–2.0)
Bicarbonate: 33.5 mmol/L — ABNORMAL HIGH (ref 20.0–28.0)
Delivery systems: POSITIVE
FIO2: 0.3
O2 Saturation: 92.2 %
PEEP: 5 cmH2O
PIP: 8 cmH2O
Patient temperature: 37
RATE: 15 resp/min
pCO2, Ven: 41 mmHg — ABNORMAL LOW (ref 44.0–60.0)
pH, Ven: 7.52 — ABNORMAL HIGH (ref 7.250–7.430)
pO2, Ven: 57 mmHg — ABNORMAL HIGH (ref 32.0–45.0)

## 2018-09-22 LAB — TRIGLYCERIDES: Triglycerides: 119 mg/dL (ref <150)

## 2018-09-22 LAB — LACTATE DEHYDROGENASE: LDH: 141 U/L (ref 98–192)

## 2018-09-22 LAB — APTT: aPTT: 34 seconds (ref 24–36)

## 2018-09-22 LAB — PROCALCITONIN: Procalcitonin: <0.1 ng/mL

## 2018-09-22 LAB — TROPONIN I
Troponin I: 0.77 ng/mL (ref <0.03)
Troponin I: 0.66 ng/mL (ref <0.03)

## 2018-09-22 LAB — PROTIME-INR
INR: 1.2 (ref 0.8–1.2)
Prothrombin Time: 15 seconds (ref 11.4–15.2)

## 2018-09-22 LAB — C-REACTIVE PROTEIN: CRP: 18.6 mg/dL — ABNORMAL HIGH (ref <1.0)

## 2018-09-22 LAB — SARS CORONAVIRUS 2 BY RT PCR (HOSPITAL ORDER, PERFORMED IN ~~LOC~~ HOSPITAL LAB): SARS Coronavirus 2: NEGATIVE

## 2018-09-22 LAB — LACTIC ACID, PLASMA
Lactic Acid, Venous: 2.2 mmol/L (ref 0.5–1.9)
Lactic Acid, Venous: 2.4 mmol/L (ref 0.5–1.9)
Lactic Acid, Venous: 2.3 mmol/L (ref 0.5–1.9)

## 2018-09-22 LAB — MAGNESIUM: Magnesium: 2.5 mg/dL — ABNORMAL HIGH (ref 1.7–2.4)

## 2018-09-22 LAB — BRAIN NATRIURETIC PEPTIDE: B Natriuretic Peptide: 4227 pg/mL — ABNORMAL HIGH (ref 0.0–100.0)

## 2018-09-22 LAB — PHOSPHORUS: Phosphorus: 4.7 mg/dL — ABNORMAL HIGH (ref 2.5–4.6)

## 2018-09-22 LAB — FERRITIN: Ferritin: 159 ng/mL (ref 11–307)

## 2018-09-22 LAB — FIBRINOGEN: Fibrinogen: >750 mg/dL — ABNORMAL HIGH (ref 210–475)

## 2018-09-22 LAB — GLUCOSE, CAPILLARY: Glucose-Capillary: 226 mg/dL — ABNORMAL HIGH (ref 70–99)

## 2018-09-22 LAB — MRSA PCR SCREENING: MRSA by PCR: NEGATIVE

## 2018-09-22 MED ORDER — AMIODARONE LOAD VIA INFUSION
150.0000 mg | Freq: Once | INTRAVENOUS | Status: DC
  Filled 2018-09-22: qty 83.34

## 2018-09-22 MED ORDER — HEPARIN (PORCINE) 25000 UT/250ML-% IV SOLN
750.0000 [IU]/h | INTRAVENOUS | Status: DC
  Administered 2018-09-22: 650 [IU]/h via INTRAVENOUS
  Administered 2018-09-24: 750 [IU]/h via INTRAVENOUS
  Filled 2018-09-22: qty 250
  Filled 2018-09-22: qty 2000

## 2018-09-22 MED ORDER — DILTIAZEM HCL 100 MG IV SOLR
5.0000 mg/h | INTRAVENOUS | Status: DC
  Administered 2018-09-22: 5 mg/h via INTRAVENOUS
  Filled 2018-09-22 (×2): qty 100

## 2018-09-22 MED ORDER — POTASSIUM CHLORIDE 10 MEQ/100ML IV SOLN
10.0000 meq | INTRAVENOUS | Status: AC
  Administered 2018-09-22 – 2018-09-23 (×3): 10 meq via INTRAVENOUS
  Filled 2018-09-22 (×3): qty 100

## 2018-09-22 MED ORDER — MAGNESIUM SULFATE 2 GM/50ML IV SOLN
INTRAVENOUS | Status: AC
  Filled 2018-09-22: qty 50

## 2018-09-22 MED ORDER — LEVOTHYROXINE SODIUM 100 MCG/5ML IV SOLN: 37.5000 ug | Freq: Every day | INTRAVENOUS | Status: DC

## 2018-09-22 MED ORDER — SODIUM CHLORIDE 0.9% FLUSH
3.0000 mL | Freq: Two times a day (BID) | INTRAVENOUS | Status: DC
  Administered 2018-09-22 – 2018-09-26 (×6): 3 mL via INTRAVENOUS

## 2018-09-22 MED ORDER — VANCOMYCIN HCL 500 MG IV SOLR
500.0000 mg | INTRAVENOUS | Status: DC
  Filled 2018-09-22: qty 500

## 2018-09-22 MED ORDER — MAGNESIUM SULFATE 2 GM/50ML IV SOLN
2.0000 g | Freq: Once | INTRAVENOUS | Status: AC
  Administered 2018-09-22: 2 g via INTRAVENOUS

## 2018-09-22 MED ORDER — FUROSEMIDE 10 MG/ML IJ SOLN
INTRAMUSCULAR | Status: AC
  Administered 2018-09-22: 40 mg via INTRAVENOUS
  Filled 2018-09-22: qty 4

## 2018-09-22 MED ORDER — DILTIAZEM HCL 100 MG IV SOLR
5.0000 mg/h | INTRAVENOUS | Status: DC
  Administered 2018-09-22 – 2018-09-23 (×3): 15 mg/h via INTRAVENOUS
  Filled 2018-09-22 (×4): qty 100

## 2018-09-22 MED ORDER — SODIUM CHLORIDE 0.9% FLUSH: 3.0000 mL | INTRAVENOUS | Status: DC | PRN

## 2018-09-22 MED ORDER — HEPARIN BOLUS VIA INFUSION
2600.0000 [IU] | Freq: Once | INTRAVENOUS | Status: AC
  Administered 2018-09-22: 21:00:00 2600 [IU] via INTRAVENOUS
  Filled 2018-09-22: qty 2600

## 2018-09-22 MED ORDER — SODIUM CHLORIDE 0.9 % IV SOLN
1.0000 g | Freq: Once | INTRAVENOUS | Status: AC
  Administered 2018-09-22: 18:00:00 1 g via INTRAVENOUS
  Filled 2018-09-22: qty 10

## 2018-09-22 MED ORDER — DILTIAZEM HCL 25 MG/5ML IV SOLN
20.0000 mg | Freq: Once | INTRAVENOUS | Status: AC
  Administered 2018-09-22: 20 mg via INTRAVENOUS

## 2018-09-22 MED ORDER — AMIODARONE HCL IN DEXTROSE 360-4.14 MG/200ML-% IV SOLN: 30.0000 mg/h | INTRAVENOUS | Status: DC

## 2018-09-22 MED ORDER — ONDANSETRON HCL 4 MG PO TABS: 4.0000 mg | ORAL_TABLET | Freq: Four times a day (QID) | ORAL | Status: DC | PRN

## 2018-09-22 MED ORDER — ALBUTEROL SULFATE HFA 108 (90 BASE) MCG/ACT IN AERS: 6.0000 | INHALATION_SPRAY | RESPIRATORY_TRACT | Status: DC | PRN

## 2018-09-22 MED ORDER — METHYLPREDNISOLONE SODIUM SUCC 125 MG IJ SOLR
INTRAMUSCULAR | Status: AC
  Filled 2018-09-22: qty 2

## 2018-09-22 MED ORDER — SENNOSIDES-DOCUSATE SODIUM 8.6-50 MG PO TABS: 1.0000 | ORAL_TABLET | Freq: Every evening | ORAL | Status: DC | PRN

## 2018-09-22 MED ORDER — IPRATROPIUM-ALBUTEROL 0.5-2.5 (3) MG/3ML IN SOLN
3.0000 mL | Freq: Four times a day (QID) | RESPIRATORY_TRACT | Status: DC
  Administered 2018-09-22 – 2018-09-26 (×14): 3 mL via RESPIRATORY_TRACT
  Filled 2018-09-22 (×14): qty 3

## 2018-09-22 MED ORDER — ACETAMINOPHEN 650 MG RE SUPP: 650.0000 mg | Freq: Four times a day (QID) | RECTAL | Status: DC | PRN

## 2018-09-22 MED ORDER — IPRATROPIUM-ALBUTEROL 0.5-2.5 (3) MG/3ML IN SOLN: 3.0000 mL | Freq: Once | RESPIRATORY_TRACT | Status: DC

## 2018-09-22 MED ORDER — SODIUM CHLORIDE 0.9 % IV SOLN
1.0000 g | Freq: Two times a day (BID) | INTRAVENOUS | Status: DC
  Administered 2018-09-22: 1 g via INTRAVENOUS
  Filled 2018-09-22 (×2): qty 1

## 2018-09-22 MED ORDER — DILTIAZEM HCL 25 MG/5ML IV SOLN
10.0000 mg | Freq: Once | INTRAVENOUS | Status: AC
  Administered 2018-09-22: 10 mg via INTRAVENOUS

## 2018-09-22 MED ORDER — PANTOPRAZOLE SODIUM 40 MG IV SOLR
40.0000 mg | INTRAVENOUS | Status: DC
  Administered 2018-09-22 – 2018-09-25 (×4): 40 mg via INTRAVENOUS
  Filled 2018-09-22 (×4): qty 40

## 2018-09-22 MED ORDER — SODIUM CHLORIDE 0.9 % IV SOLN
250.0000 mL | INTRAVENOUS | Status: DC | PRN
  Administered 2018-09-25 – 2018-09-26 (×2): 250 mL via INTRAVENOUS

## 2018-09-22 MED ORDER — ALBUTEROL (5 MG/ML) CONTINUOUS INHALATION SOLN
5.0000 mg | INHALATION_SOLUTION | RESPIRATORY_TRACT | Status: DC
  Filled 2018-09-22: qty 1

## 2018-09-22 MED ORDER — FUROSEMIDE 10 MG/ML IJ SOLN
40.0000 mg | Freq: Once | INTRAMUSCULAR | Status: AC
Start: 2018-09-22 — End: 2018-09-22
  Administered 2018-09-22: 17:00:00 40 mg via INTRAVENOUS

## 2018-09-22 MED ORDER — SODIUM CHLORIDE 0.9 % IV BOLUS
250.0000 mL | Freq: Once | INTRAVENOUS | Status: AC
  Administered 2018-09-22: 250 mL via INTRAVENOUS

## 2018-09-22 MED ORDER — DILTIAZEM HCL 25 MG/5ML IV SOLN
INTRAVENOUS | Status: AC
  Filled 2018-09-22: qty 10

## 2018-09-22 MED ORDER — SODIUM CHLORIDE 0.9 % IV BOLUS: 1000.0000 mL | Freq: Once | INTRAVENOUS | Status: DC

## 2018-09-22 MED ORDER — SODIUM CHLORIDE 0.9 % IV SOLN
500.0000 mg | Freq: Once | INTRAVENOUS | Status: AC
  Administered 2018-09-22: 18:00:00 500 mg via INTRAVENOUS
  Filled 2018-09-22: qty 500

## 2018-09-22 MED ORDER — ONDANSETRON HCL 4 MG/2ML IJ SOLN: 4.0000 mg | Freq: Four times a day (QID) | INTRAMUSCULAR | Status: DC | PRN

## 2018-09-22 MED ORDER — METHYLPREDNISOLONE SODIUM SUCC 125 MG IJ SOLR: 60.0000 mg | Freq: Two times a day (BID) | INTRAMUSCULAR | Status: DC

## 2018-09-22 MED ORDER — ACETAMINOPHEN 325 MG PO TABS: 650.0000 mg | ORAL_TABLET | Freq: Four times a day (QID) | ORAL | Status: DC | PRN

## 2018-09-22 MED ORDER — IPRATROPIUM-ALBUTEROL 0.5-2.5 (3) MG/3ML IN SOLN
3.0000 mL | Freq: Once | RESPIRATORY_TRACT | Status: DC
Start: 2018-09-22 — End: 2018-09-22

## 2018-09-22 MED ORDER — AMIODARONE HCL IN DEXTROSE 360-4.14 MG/200ML-% IV SOLN
60.0000 mg/h | INTRAVENOUS | Status: DC
  Filled 2018-09-22: qty 200

## 2018-09-22 MED ORDER — METHYLPREDNISOLONE SODIUM SUCC 125 MG IJ SOLR
125.0000 mg | Freq: Once | INTRAMUSCULAR | Status: AC
  Administered 2018-09-22: 125 mg via INTRAVENOUS

## 2018-09-22 NOTE — ED Notes (Signed)
X-ray at bedside

## 2018-09-22 NOTE — ED Notes (Signed)
ED TO INPATIENT HANDOFF REPORT  ED Nurse Name and Phone #:  Anda Kraft [phone]  V Name/Age/Gender Holly Clark 85 y.o. female Room/Bed: ED11A/ED11A  Code Status   Code Status: DNR  Home/SNF/Other Nursing Home Patient oriented to: self Is this baseline? Yes   Triage Complete: Triage complete  Chief Complaint Respiratory Distress  Triage Note Pt arrives [facility] from [facility].  Started on lasix today for fluid overload. Hx COPD. Cough present. No fever. 97.4 axillary. 154/90. 99% on 4L. Was never hypoxic. HR was 170-190 SVT. Hx a fib.   Hx dementia. Arrives with mask in place. Appears in resp distress upon arrival. Sounds fluid overloaded. Arrives in a fib.    Allergies Allergies  Allergen Reactions  . Hydrocodone Other (See Comments)  . Oxycodone Hives    Level of Care/Admitting Diagnosis ED Disposition    ED Disposition Condition [facility] Area: [place] [[id number]]  Level of Care: Stepdown [14]  Covid Evaluation: N/A  Diagnosis: Acute respiratory failure ([place]) [518.81.ICD-9-CM]  Admitting Physician: Saundra Shelling [[id number]]  Attending Physician: Saundra Shelling [[id number]]  Estimated length of stay: past midnight tomorrow  Certification:: I certify this patient will need inpatient services for at least 2 midnights  PT Class (Do Not Modify): Inpatient [101]  PT Acc Code (Do Not Modify): Private [1]       B Medical/Surgery History Past Medical History:  Diagnosis Date  . Anxiety    unspecified  . Atrial fibrillation ([place])   . COPD (chronic obstructive pulmonary disease) ([place]) 10/05/2013  . Coronary artery disease 10/05/2013  . Dementia ([place])   . Depression   . Diastolic heart failure ([place])   . Hyperlipidemia, unspecified   . Hypertension 10/05/2013  . Hypothyroidism   . Hypothyroidism, acquired    unspecified  . PVD (peripheral vascular disease) ([place]) 10/05/2013   iliac stents   Past Surgical History:   Procedure Laterality Date  . ABDOMINAL HYSTERECTOMY    . CARDIAC SURGERY    . CARDIOVERSION    . CATARACT EXTRACTION Bilateral 2012  . CORONARY ARTERY BYPASS GRAFT  1985   x3  . ESOPHAGOGASTRODUODENOSCOPY N/A 07/09/2017   Procedure: ESOPHAGOGASTRODUODENOSCOPY (EGD);  Surgeon: Lin Landsman, MD;  Location: [facility] ENDOSCOPY;  Service: Gastroenterology;  Laterality: N/A;  . [place]   patient unsure of which side  . HIP PINNING,CANNULATED Right 05/19/2015   Procedure: CANNULATED HIP PINNING;  Surgeon: Hessie Knows, MD;  Location: [facility] ORS;  Service: Orthopedics;  Laterality: Right;     A IV Location/Drains/Wounds Patient Lines/Drains/Airways Status   Active Line/Drains/Airways    Name:   Placement date:   Placement time:   Site:   Days:   Peripheral IV 09/22/18 Left Forearm   09/22/18    1709    Forearm   less than 1   Peripheral IV 09/22/18 Right Antecubital   09/22/18    1718    Antecubital   less than 1   Peripheral IV 09/22/18 Left Hand   09/22/18    1803    Hand   less than 1   Urethral Catheter Sonjia RN  Latex 16 Fr.   09/22/18    1734    Latex   less than 1          Intake/Output Last 24 hours  Intake/Output Summary (Last 24 hours) at 09/22/2018 1852 Last data filed at 09/22/2018 1838 Gross per 24 hour  Intake 136.27 ml  Output -  Net 136.27 ml    Labs/Imaging Results for orders placed or performed during the hospital encounter of 09/22/18 (from the past 48 hour(s))  Blood gas, venous     Status: Abnormal   Collection Time: 09/22/18  5:06 PM  Result Value Ref Range   FIO2 0.30    Delivery systems BILEVEL POSITIVE AIRWAY PRESSURE    LHR 15 resp/min   Peep/cpap 5.0 cm H20   PIP 8.0 cm H2O   pH, Ven 7.52 (H) 7.250 - 7.430   pCO2, Ven 41 (L) 44.0 - 60.0 mmHg   pO2, Ven 57.0 (H) 32.0 - 45.0 mmHg   Bicarbonate 33.5 (H) 20.0 - 28.0 mmol/L   Acid-Base Excess 9.7 (H) 0.0 - 2.0 mmol/L   O2 Saturation 92.2 %   Patient temperature 37.0     Collection site VENOUS    Sample type VENOUS     Comment: Performed at [facility], [address] [place], [place] [address]  CBC with Differential/Platelet     Status: Abnormal   Collection Time: 09/22/18  5:08 PM  Result Value Ref Range   WBC 14.6 (H) 4.0 - 10.5 K/uL   RBC 4.17 3.87 - 5.11 MIL/uL   Hemoglobin 12.2 12.0 - 15.0 g/dL   HCT 37.6 36.0 - 46.0 %   MCV 90.2 80.0 - 100.0 fL   MCH 29.3 26.0 - 34.0 pg   MCHC 32.4 30.0 - 36.0 g/dL   RDW 14.2 11.5 - 15.5 %   Platelets 283 150 - 400 K/uL   nRBC 0.0 0.0 - 0.2 %   Neutrophils Relative % 67 %   Neutro Abs 9.9 (H) 1.7 - 7.7 K/uL   Lymphocytes Relative 6 %   Lymphs Abs 0.8 0.7 - 4.0 K/uL   Monocytes Relative 25 %   Monocytes Absolute 3.6 (H) 0.1 - 1.0 K/uL   Eosinophils Relative 1 %   Eosinophils Absolute 0.2 0.0 - 0.5 K/uL   Basophils Relative 0 %   Basophils Absolute 0.0 0.0 - 0.1 K/uL   WBC Morphology MORPHOLOGY UNREMARKABLE    RBC Morphology MORPHOLOGY UNREMARKABLE    Smear Review Normal platelet morphology    Immature Granulocytes 1 %   Abs Immature Granulocytes 0.10 (H) 0.00 - 0.07 K/uL    Comment: Performed at [facility], [address] [place], [place] [address]  Troponin I - ONCE - STAT     Status: Abnormal   Collection Time: 09/22/18  5:08 PM  Result Value Ref Range   Troponin I 0.77 (HH) <0.03 ng/mL    Comment: CRITICAL RESULT CALLED TO, READ BACK BY AND VERIFIED WITH LORI LEMONS @1744  09/22/18 AKT Performed at [facility], [address] [place], [place] [address]   Lactic acid, plasma     Status: Abnormal   Collection Time: 09/22/18  5:08 PM  Result Value Ref Range   Lactic Acid, Venous 2.2 (HH) 0.5 - 1.9 mmol/L    Comment: CRITICAL RESULT CALLED TO, READ BACK BY AND VERIFIED WITH LORI LEMONS @1738  09/22/18 AKT Performed at [facility], [address] [place], [place] [address]   SARS Coronavirus 2 [facility] order, Performed in [place] hospital lab)      Status: None   Collection Time: 09/22/18  5:08 PM  Result Value Ref Range   SARS Coronavirus 2 NEGATIVE NEGATIVE    Comment: (NOTE) If result is NEGATIVE SARS-CoV-2 target nucleic acids are NOT DETECTED. The SARS-CoV-2 RNA is generally detectable  in upper and lower  respiratory specimens during the acute phase of infection. The lowest  concentration of SARS-CoV-2 viral copies this assay can detect is 250  copies / mL. A negative result does not preclude SARS-CoV-2 infection  and should not be used as the sole basis for treatment or other  patient management decisions.  A negative result may occur with  improper specimen collection / handling, submission of specimen other  than nasopharyngeal swab, presence of viral mutation(s) within the  areas targeted by this assay, and inadequate number of viral copies  (<250 copies / mL). A negative result must be combined with clinical  observations, patient history, and epidemiological information. If result is POSITIVE SARS-CoV-2 target nucleic acids are DETECTED. The SARS-CoV-2 RNA is generally detectable in upper and lower  respiratory specimens dur ing the acute phase of infection.  Positive  results are indicative of active infection with SARS-CoV-2.  Clinical  correlation with patient history and other diagnostic information is  necessary to determine patient infection status.  Positive results do  not rule out bacterial infection or co-infection with other viruses. If result is PRESUMPTIVE POSTIVE SARS-CoV-2 nucleic acids MAY BE PRESENT.   A presumptive positive result was obtained on the submitted specimen  and confirmed on repeat testing.  While 2019 novel coronavirus  (SARS-CoV-2) nucleic acids may be present in the submitted sample  additional confirmatory testing may be necessary for epidemiological  and / or clinical management purposes  to differentiate between  SARS-CoV-2 and other Sarbecovirus currently known to infect humans.   If clinically indicated additional testing with an alternate test  methodology [phone]) is advised. The SARS-CoV-2 RNA is generally  detectable in upper and lower respiratory sp ecimens during the acute  phase of infection. The expected result is Negative. Fact Sheet for Patients:  StrictlyIdeas.no Fact Sheet for Healthcare Providers: BankingDealers.co.za This test is not yet approved or cleared by the [place] FDA and has been authorized for detection and/or diagnosis of SARS-CoV-2 by FDA under an Emergency Use Authorization (EUA).  This EUA will remain in effect (meaning this test can be used) for the duration of the COVID-19 declaration under Section 564(b)(1) of the Act, 21 U.S.C. section 360bbb-3(b)(1), unless the authorization is terminated or revoked sooner. Performed at [facility], [address] [place], [place] [address]   Brain natriuretic peptide     Status: Abnormal   Collection Time: 09/22/18  5:08 PM  Result Value Ref Range   B Natriuretic Peptide 4,227.0 (H) 0.0 - 100.0 pg/mL    Comment: Performed at [facility], [address] [place], [place] [address]   Dg Chest Portable 1 View  Result Date: 09/22/2018 CLINICAL DATA:  85 year old female started on Lasix today for fluid overload. SVT. Cough. EXAM: PORTABLE CHEST 1 VIEW COMPARISON:  Chest radiographs 08/07/2018 and earlier. FINDINGS: Portable AP upright view at 1738 hours. Chronic hiatal hernia. Stable cardiac size and mediastinal contours. Prior CABG. Stable somewhat large lung volumes with no pneumothorax, pulmonary edema, pleural effusion or confluent pulmonary opacity. No acute osseous abnormality identified. IMPRESSION: No acute cardiopulmonary abnormality. Electronically Signed   By: Genevie Ann M.D.   On: 09/22/2018 18:21    Pending Labs Unresulted Labs (From admission, onward)    Start     Ordered   09/23/18 4287  Basic metabolic panel   Tomorrow morning,   STAT     09/22/18 1840   09/23/18 0500  CBC  Tomorrow morning,   STAT  09/22/18 1840   09/22/18 2200  Troponin I - Now Then Q6H  Now then every 6 hours,   STAT     09/22/18 1840   09/22/18 2100  Lactic acid, plasma  Once,   STAT     09/22/18 1841   09/22/18 1838  Lactic acid, plasma  ONCE - STAT,   STAT     09/22/18 1840   09/22/18 1714  Procalcitonin  ONCE - STAT,   STAT     09/22/18 1713   09/22/18 1714  Lactate dehydrogenase  Once,   STAT     09/22/18 1713   09/22/18 1714  Ferritin  Once,   STAT     09/22/18 1713   09/22/18 1714  Triglycerides  Once,   STAT     09/22/18 1713   09/22/18 1714  Fibrinogen  Once,   STAT     09/22/18 1713   09/22/18 1714  C-reactive protein  Once,   STAT     09/22/18 1713   09/22/18 1707  Comprehensive metabolic panel  ONCE - STAT,   STAT     09/22/18 1707   09/22/18 1706  Blood culture (routine x 2)  BLOOD CULTURE X 2,   STAT     09/22/18 1705          Vitals/Pain Today's Vitals   09/22/18 1740 09/22/18 1742 09/22/18 1745 09/22/18 1800  BP:   137/81 (!) 125/94  Pulse: (!) 128  89 (!) 51  Resp:   (!) 23 (!) 26  Temp:  99.9 F (37.7 C)    TempSrc:  Axillary    SpO2:   94% 94%  Weight:      Height:        Isolation Precautions No active isolations  Medications Medications  diltiazem (CARDIZEM) 100 mg in dextrose 5 % 100 mL (1 mg/mL) infusion (15 mg/hr Intravenous Rate/Dose Change 09/22/18 1800)  azithromycin (ZITHROMAX) 500 mg in sodium chloride 0.9 % 250 mL IVPB (500 mg Intravenous New Bag/Given 09/22/18 1806)  sodium chloride 0.9 % bolus 1,000 mL (has no administration in time range)  sodium chloride flush (NS) 0.9 % injection 3 mL (has no administration in time range)  sodium chloride flush (NS) 0.9 % injection 3 mL (has no administration in time range)  0.9 %  sodium chloride infusion (has no administration in time range)  acetaminophen (TYLENOL) tablet 650 mg (has no administration in time range)    Or   acetaminophen (TYLENOL) suppository 650 mg (has no administration in time range)  ondansetron (ZOFRAN) tablet 4 mg (has no administration in time range)    Or  ondansetron (ZOFRAN) injection 4 mg (has no administration in time range)  senna-docusate (Senokot-S) tablet 1 tablet (has no administration in time range)  diltiazem (CARDIZEM) 100 mg in dextrose 5 % 100 mL (1 mg/mL) infusion (has no administration in time range)  furosemide (LASIX) injection 40 mg (40 mg Intravenous Given 09/22/18 1724)  methylPREDNISolone sodium succinate (SOLU-MEDROL) 125 mg/2 mL injection 125 mg (125 mg Intravenous Given 09/22/18 1719)  diltiazem (CARDIZEM) injection 10 mg (10 mg Intravenous Given 09/22/18 1718)  magnesium sulfate IVPB 2 g 50 mL (0 g Intravenous Stopped 09/22/18 1751)  diltiazem (CARDIZEM) injection 20 mg (20 mg Intravenous Given 09/22/18 1725)  cefTRIAXone (ROCEPHIN) 1 g in sodium chloride 0.9 % 100 mL IVPB ( Intravenous Stopped 09/22/18 1827)    Mobility walks with device High fall risk   Focused Assessments  R Recommendations: See Admitting Provider Note  Report given to:   Additional Notes:  Hx dementia

## 2018-09-22 NOTE — ED Provider Notes (Signed)
[name] Emergency Department Provider Note  ____________________________________________  Time seen: Approximately 5:37 PM  I have reviewed the triage vital signs and the nursing notes.   HISTORY  Chief Complaint Shortness of Breath  Level 5 caveat:  Portions of the history and physical were unable to be obtained due to advanced dementia   HPI Holly Clark is a 85 y.o. female with a history of atrial fibrillation, COPD, advanced dementia, CAD, diastolic heart failure, hypertension, hyperlipidemia, hypothyroidism who presents for evaluation of shortness of breath.  According to EMS patient has had progressively worsening shortness of breath for the last few days.  Patient has had a cough.  She is usually on 2 L nasal cannula however today has required 4 L.  Patient was supposed to be started on Lasix today for volume overload.  Patient is unable to provide any history due to advanced dementia which is her baseline.  According to EMS no fevers.  Past Medical History:  Diagnosis Date  . Anxiety    unspecified  . Atrial fibrillation ([place])   . COPD (chronic obstructive pulmonary disease) ([place]) 10/05/2013  . Coronary artery disease 10/05/2013  . Dementia ([place])   . Depression   . Diastolic heart failure ([place])   . Hyperlipidemia, unspecified   . Hypertension 10/05/2013  . Hypothyroidism   . Hypothyroidism, acquired    unspecified  . PVD (peripheral vascular disease) ([place]) 10/05/2013   iliac stents    Patient Active Problem List   Diagnosis Date Noted  . Acute respiratory failure ([place]) 09/22/2018  . HCAP (healthcare-associated pneumonia) 07/17/2018  . Hypokalemia 07/17/2018  . Coronary artery disease involving coronary bypass graft of native heart 01/14/2018  . Hypothyroidism due to acquired atrophy of thyroid 12/11/2017  . Chronic constipation 12/11/2017  . Depression with anxiety 12/11/2017  . Vascular dementia without behavioral disturbance  ([place]) 12/11/2017  . Chronic right hip pain 12/11/2017  . Failure to thrive in adult 12/11/2017  . Poor appetite 09/06/2017  . Chronic diastolic heart failure ([place]) 08/01/2017  . HTN (hypertension) 08/01/2017  . Atrial fibrillation ([place]) 08/01/2017  . COPD (chronic obstructive pulmonary disease) ([place]) 08/01/2017  . Esophagitis, erosive   . Iron deficiency anemia due to chronic blood loss   . [place] lesion, chronic   . Large hiatal hernia   . Hip fracture, right ([place]) 05/18/2015    Past Surgical History:  Procedure Laterality Date  . ABDOMINAL HYSTERECTOMY    . CARDIAC SURGERY    . CARDIOVERSION    . CATARACT EXTRACTION Bilateral 2012  . CORONARY ARTERY BYPASS GRAFT  1985   x3  . ESOPHAGOGASTRODUODENOSCOPY N/A 07/09/2017   Procedure: ESOPHAGOGASTRODUODENOSCOPY (EGD);  Surgeon: Lin Landsman, MD;  Location: [facility] ENDOSCOPY;  Service: Gastroenterology;  Laterality: N/A;  . [place]   patient unsure of which side  . HIP PINNING,CANNULATED Right 05/19/2015   Procedure: CANNULATED HIP PINNING;  Surgeon: Hessie Knows, MD;  Location: [facility] ORS;  Service: Orthopedics;  Laterality: Right;    Prior to Admission medications   Medication Sig Start Date End Date Taking? Authorizing Provider  aspirin EC 81 MG tablet Take 81 mg by mouth daily.    Yes [provider]  furosemide (LASIX) 40 MG tablet Take 40 mg by mouth daily.   Yes [provider]  guaifenesin (ROBITUSSIN) 100 MG/5ML syrup Take 200 mg by mouth every 4 (four) hours as needed for cough.   Yes [provider]  sertraline (ZOLOFT)  100 MG tablet Take 100 mg by mouth daily.  05/08/18  Yes [provider]  Vitamin D, Ergocalciferol, (DRISDOL) 1.25 MG (50000 UT) CAPS capsule Take 50,000 Units by mouth every 30 (thirty) days.   Yes [provider]  docusate sodium (COLACE) 100 MG capsule Take 1 capsule (100 mg total) by mouth 2 (two) times daily. 05/22/15   Epifanio Lesches, MD  feeding supplement, ENSURE ENLIVE, (ENSURE ENLIVE) LIQD Take 237 mLs by mouth 2 (two) times daily. give with a meal for nutrition    [provider]  levothyroxine (SYNTHROID, LEVOTHROID) 75 MCG tablet Take 1 tablet (75 mcg total) by mouth daily before breakfast. Patient not taking: Reported on 09/22/2018 07/14/17   Vaughan Basta, MD  nitroGLYCERIN (NITROSTAT) 0.4 MG SL tablet Place 0.4 mg under the tongue every 5 (five) minutes as needed for chest pain.    [provider]  Nutritional Supplements (NUTRITIONAL SUPPLEMENT PO) Diet Type: Regular consistency, NAS 07/11/18   [provider]  OXYGEN Inhale 2 L into the lungs continuous as needed.     [provider]  pantoprazole (PROTONIX) 40 MG tablet Take 1 tablet (40 mg total) by mouth 2 (two) times daily. Patient not taking: Reported on 09/22/2018 07/10/17   Vaughan Basta, MD  polyethylene glycol [facility] Floria Raveling) packet Take 17 g by mouth daily. 03/22/18   [provider]    Allergies Hydrocodone and Oxycodone  Family History  Problem Relation Age of Onset  . Stroke Mother   . Cancer Sister   . Parkinsonism Brother     Social History Social History   Tobacco Use  . Smoking status: Former Smoker    Packs/day: 0.25    Years: 30.00    Pack years: 7.50    Types: Cigarettes    Last attempt to quit: 07/01/2017    Years since quitting: 1.2  . Smokeless tobacco: Never Used  Substance Use Topics  . Alcohol use: Yes    Comment: rare, glass of wine occasionally  . Drug use: No    Review of Systems  Constitutional: Negative for fever. Respiratory: Negative for shortness of breath, cough  ____________________________________________   PHYSICAL EXAM:  VITAL SIGNS: ED Triage Vitals  Enc Vitals Group     BP 09/22/18 1730 137/79     Pulse Rate 09/22/18 1702 94     Resp 09/22/18 1702 (!) 40     Temp --      Temp src --      SpO2 09/22/18 1702 92 %      Weight 09/22/18 1704 96 lb 9.6 oz (43.8 kg)     Height 09/22/18 1704 5\' 1"  (1.549 m)     Head Circumference --      Peak Flow --      Pain Score --      Pain Loc --      Pain Edu? --      Excl. in [place]? --     Constitutional: Alert and oriented, moderate respiratory distress.  HEENT:      Head: Normocephalic and atraumatic.         Eyes: Conjunctivae are normal. Sclera is non-icteric.       Mouth/Throat: Mucous membranes are moist.       Neck: Supple with no signs of meningismus. Cardiovascular: Irregularly irregular rhythm with tachycardic rate.No JVD. Respiratory: Increased work of breathing, satting in the low 90s on 4 L nasal cannula, diffuse crackles throughout gastrointestinal: Soft, non tender,  and non distended with positive bowel sounds. No rebound or guarding. Musculoskeletal:  No edema, cyanosis, or erythema of extremities. Neurologic:  Face is symmetric. Moving all extremities.  Skin: Skin is warm, dry and intact. No rash noted. Psychiatric: Mood and affect are normal. Speech and behavior are normal.  ____________________________________________   LABS (all labs ordered are listed, but only abnormal results are displayed)  Labs Reviewed  CBC WITH DIFFERENTIAL/PLATELET - Abnormal; Notable for the following components:      Result Value   WBC 14.6 (*)    Neutro Abs 9.9 (*)    Monocytes Absolute 3.6 (*)    Abs Immature Granulocytes 0.10 (*)    All other components within normal limits  TROPONIN I - Abnormal; Notable for the following components:   Troponin I 0.77 (*)    All other components within normal limits  LACTIC ACID, PLASMA - Abnormal; Notable for the following components:   Lactic Acid, Venous 2.2 (*)    All other components within normal limits  BLOOD GAS, VENOUS - Abnormal; Notable for the following components:   pH, Ven 7.52 (*)    pCO2, Ven 41 (*)    pO2, Ven 57.0 (*)    Bicarbonate 33.5 (*)    Acid-Base Excess 9.7 (*)    All other components within  normal limits  BRAIN NATRIURETIC PEPTIDE - Abnormal; Notable for the following components:   B Natriuretic Peptide 4,227.0 (*)    All other components within normal limits  SARS CORONAVIRUS 2 (HOSPITAL ORDER, [facility])  CULTURE, BLOOD (ROUTINE X 2)  CULTURE, BLOOD (ROUTINE X 2)  COMPREHENSIVE METABOLIC PANEL  PROCALCITONIN  LACTATE DEHYDROGENASE  FERRITIN  TRIGLYCERIDES  FIBRINOGEN  C-REACTIVE PROTEIN  LACTIC ACID, PLASMA  BASIC METABOLIC PANEL  CBC  TROPONIN I  TROPONIN I  LACTIC ACID, PLASMA  PROTIME-INR  APTT   ____________________________________________  EKG  ED ECG REPORT I, Rudene Re, the attending physician, personally viewed and interpreted this ECG.  Afib, rate of 179, right bundle branch block, normal QTC. Unchanged from prior other than RVR ____________________________________________  RADIOLOGY  I have personally reviewed the images performed during this visit and I agree with the Radiologist's read.   Interpretation by Radiologist:  Dg Chest Portable 1 View  Result Date: 09/22/2018 CLINICAL DATA:  85 year old female started on Lasix today for fluid overload. SVT. Cough. EXAM: PORTABLE CHEST 1 VIEW COMPARISON:  Chest radiographs 08/07/2018 and earlier. FINDINGS: Portable AP upright view at 1738 hours. Chronic hiatal hernia. Stable cardiac size and mediastinal contours. Prior CABG. Stable somewhat large lung volumes with no pneumothorax, pulmonary edema, pleural effusion or confluent pulmonary opacity. No acute osseous abnormality identified. IMPRESSION: No acute cardiopulmonary abnormality. Electronically Signed   By: Genevie Ann M.D.   On: 09/22/2018 18:21      ____________________________________________   PROCEDURES  Procedure(s) performed: None Procedures Critical Care performed: yes  CRITICAL CARE Performed by: Rudene Re  ?  Total critical care time: 45 min  Critical care time was exclusive of  separately billable procedures and treating other patients.  Critical care was necessary to treat or prevent imminent or life-threatening deterioration.  Critical care was time spent personally by me on the following activities: development of treatment plan with patient and/or surrogate as well as nursing, discussions with consultants, evaluation of patient's response to treatment, examination of patient, obtaining history from patient or surrogate, ordering and performing treatments and interventions, ordering and review of laboratory studies, ordering and  review of radiographic studies, pulse oximetry and re-evaluation of patient's condition.  ____________________________________________   INITIAL IMPRESSION / ASSESSMENT AND PLAN / ED COURSE  85 y.o. female with a history of atrial fibrillation, COPD, advanced dementia, CAD, diastolic heart failure, hypertension, hyperlipidemia, hypothyroidism who presents for evaluation of shortness of breath.  Patient arrives in respiratory distress, significantly increased work of breathing, with diffuse coarse rhonchi and crackles on bilateral lung fields.  Patient was placed on BiPAP with viral filter with significant and immediate improvement of her work of breathing.  Afebrile.  In atrial fibrillation with RVR.  Ddx Pna, CHF, COPD, Covid, viral pna. Chest x-ray showed no consolidations. Labs showing elevated WBC and lactate. Patient given rocephin and azithromycin for possible CA-PNA. Patient given cardizem bolus x 2 follow by drip and IV mag for rate control. Given IV lasix for concerns of CHF exacerbation. Given solumedrol and albuterol MDI for h/o COPD. Covid testing pending. Troponin elevated most likely due to demand ischemia.       As part of my medical decision making, I reviewed the following data within the [facility] notes reviewed and incorporated, Labs reviewed , EKG interpreted , Old EKG reviewed, Old chart reviewed,  Radiograph reviewed , Discussed with admitting physician , Notes from prior ED visits and [place] Controlled Substance Database    Pertinent labs & imaging results that were available during my care of the patient were reviewed by me and considered in my medical decision making (see chart for details).    ____________________________________________   FINAL CLINICAL IMPRESSION(S) / ED DIAGNOSES  Final diagnoses:  Acute respiratory failure with hypoxia (HCC)  Demand ischemia of myocardium (HCC)  Atrial fibrillation with RVR (HCC)      NEW MEDICATIONS STARTED DURING THIS VISIT:  ED Discharge Orders    None       Note:  This document was prepared using Dragon voice recognition software and may include unintentional dictation errors.    Alfred Levins, [place], MD 09/22/18 1910

## 2018-09-22 NOTE — ED Notes (Signed)
Attempted to call report, RN not available 

## 2018-09-22 NOTE — Progress Notes (Signed)
Patient transported on Bipap to ICU room with RN, No complications. Report given to ICU RT.

## 2018-09-22 NOTE — ED Notes (Signed)
Per EDP do not start IVF bolus d/t BNP levels

## 2018-09-22 NOTE — Progress Notes (Signed)
Updated daughter via telephone.

## 2018-09-22 NOTE — ED Notes (Signed)
Date and time results received: 09/22/18 1747   Test: Lactic Acid 2.2, Troponin 0.77  Name of Provider Notified: Dr. Alfred Levins

## 2018-09-22 NOTE — ED Notes (Signed)
[name], daughter, called and updated on pt plan of care.

## 2018-09-22 NOTE — Progress Notes (Signed)
Maggie, NP notified of troponin results 0.66 and lactic acid 2.3; acknowledged. No new orders at this time.

## 2018-09-22 NOTE — ED Triage Notes (Signed)
Pt arrives [facility] from [facility].  Started on lasix today for fluid overload. Hx COPD. Cough present. No fever. 97.4 axillary. 154/90. 99% on 4L. Was never hypoxic. HR was 170-190 SVT. Hx a fib.   Hx dementia. Arrives with mask in place. Appears in resp distress upon arrival. Sounds fluid overloaded. Arrives in a fib.

## 2018-09-22 NOTE — H&P (Signed)
[facility] NAME: Holly Clark    MR#:  [id number]  DATE OF BIRTH:  03/29/1930  DATE OF ADMISSION:  09/22/2018  PRIMARY CARE PHYSICIAN: Venia Carbon, MD   REQUESTING/REFERRING PHYSICIAN:   CHIEF COMPLAINT:   Chief Complaint  Patient presents with  . Shortness of Breath    HISTORY OF PRESENT ILLNESS: Holly Clark  is a 85 y.o. female with a known history of atrial fibrillation, COPD, diastolic heart failure, hypertension, hyperlipidemia, hypothyroidism, peripheral vascular disease presented to the emergency room with increased shortness of breath.  Patient is from the facility and DNR by [place].  Not a great historian has history of dementia.  She was put on BiPAP for respiratory distress and stabilized.  Patient was given 1 dose of Lasix for diuresis in the ER along with Solu-Medrol and nebulization therapy.  Her lactic acid level was elevated but chest x-ray did not reveal any pneumonia.  1 round of antibiotics were given in the emergency room.  Hospitalist service was consulted.  PAST MEDICAL HISTORY:   Past Medical History:  Diagnosis Date  . Anxiety    unspecified  . Atrial fibrillation ([place])   . COPD (chronic obstructive pulmonary disease) ([place]) 10/05/2013  . Coronary artery disease 10/05/2013  . Dementia ([place])   . Depression   . Diastolic heart failure ([place])   . Hyperlipidemia, unspecified   . Hypertension 10/05/2013  . Hypothyroidism   . Hypothyroidism, acquired    unspecified  . PVD (peripheral vascular disease) ([place]) 10/05/2013   iliac stents    PAST SURGICAL HISTORY:  Past Surgical History:  Procedure Laterality Date  . ABDOMINAL HYSTERECTOMY    . CARDIAC SURGERY    . CARDIOVERSION    . CATARACT EXTRACTION Bilateral 2012  . CORONARY ARTERY BYPASS GRAFT  1985   x3  . ESOPHAGOGASTRODUODENOSCOPY N/A 07/09/2017   Procedure: ESOPHAGOGASTRODUODENOSCOPY (EGD);  Surgeon: Lin Landsman, MD;   Location: [facility] ENDOSCOPY;  Service: Gastroenterology;  Laterality: N/A;  . [place]   patient unsure of which side  . HIP PINNING,CANNULATED Right 05/19/2015   Procedure: CANNULATED HIP PINNING;  Surgeon: Hessie Knows, MD;  Location: [facility] ORS;  Service: Orthopedics;  Laterality: Right;    SOCIAL HISTORY:  Social History   Tobacco Use  . Smoking status: Former Smoker    Packs/day: 0.25    Years: 30.00    Pack years: 7.50    Types: Cigarettes    Last attempt to quit: 07/01/2017    Years since quitting: 1.2  . Smokeless tobacco: Never Used  Substance Use Topics  . Alcohol use: Yes    Comment: rare, glass of wine occasionally    FAMILY HISTORY:  Family History  Problem Relation Age of Onset  . Stroke Mother   . Cancer Sister   . Parkinsonism Brother     DRUG ALLERGIES:  Allergies  Allergen Reactions  . Hydrocodone Other (See Comments)  . Oxycodone Hives    REVIEW OF SYSTEMS:  Not much history could be obtained as patient has dementia  MEDICATIONS AT HOME:  Prior to Admission medications   Medication Sig Start Date End Date Taking? Authorizing Provider  Amino Acids-Protein Hydrolys (FEEDING SUPPLEMENT, PRO-STAT SUGAR FREE 64,) LIQD Take 30 mLs by mouth 3 (three) times daily with meals.  07/13/18   [provider]  aspirin EC 81 MG tablet Take 81 mg by mouth daily.     [provider]  docusate sodium (COLACE) 100 MG capsule Take 1 capsule (100 mg total) by mouth 2 (two) times daily. 05/22/15   Epifanio Lesches, MD  feeding supplement, ENSURE ENLIVE, (ENSURE ENLIVE) LIQD Take 237 mLs by mouth 2 (two) times daily. give with a meal for nutrition    [provider]  levalbuterol (XOPENEX) 1.25 MG/3ML nebulizer solution Take 1.25 mg by nebulization every 6 (six) hours as needed for wheezing.    [provider]  levothyroxine (SYNTHROID, LEVOTHROID) 75 MCG tablet Take 1 tablet (75 mcg total) by mouth daily before breakfast.  07/14/17   Vaughan Basta, MD  nitroGLYCERIN (NITROSTAT) 0.4 MG SL tablet Place 0.4 mg under the tongue every 5 (five) minutes as needed for chest pain.    [provider]  Nutritional Supplements (NUTRITIONAL SUPPLEMENT PO) Diet Type: Regular consistency, NAS 07/11/18   [provider]  ondansetron (ZOFRAN) 4 MG tablet Take 4 mg by mouth every 6 (six) hours as needed for nausea or vomiting. 07/13/18   [provider]  OXYGEN Inhale 2 L into the lungs continuous as needed.     [provider]  pantoprazole (PROTONIX) 40 MG tablet Take 1 tablet (40 mg total) by mouth 2 (two) times daily. 07/10/17   Vaughan Basta, MD  polyethylene glycol (MIRALAX / GLYCOLAX) packet Take 17 g by mouth daily. 03/22/18   [provider]  sertraline (ZOLOFT) 100 MG tablet Take 100 mg by mouth daily.  05/08/18   [provider]  Skin Protectants, Misc. (ENDIT EX) Apply topically. Apply 1 application topically  PRN to peri-area/buttocks    [provider]  sodium chloride (OCEAN) 0.65 % nasal spray Place 2 sprays into the nose as needed for congestion. 04/23/18   [provider]  Umeclidinium-Vilanterol (ANORO ELLIPTA) 62.5-25 MCG/INH AEPB Inhale 1 puff into the lungs daily.     [provider]      PHYSICAL EXAMINATION:   VITAL SIGNS: Blood pressure (!) 125/94, pulse (!) 51, temperature 99.9 F (37.7 C), temperature source Axillary, resp. rate (!) 26, height 5\' 1"  (1.549 m), weight 43.8 kg, SpO2 94 %.  GENERAL:  85 y.o.-year-old patient lying in the bed on BiPAP IPAP 8 EPAP 5 Rate 15 FiO2 21% EYES: Pupils equal, round, reactive to light and accommodation. No scleral icterus. Extraocular muscles intact.  HEENT: Head atraumatic, normocephalic. Oropharynx and nasopharynx clear.  NECK:  Supple, no jugular venous distention. No thyroid enlargement, no tenderness.  LUNGS: Decreased breath sounds bilaterally, bibasilar crepitations  heard . On BiPAP CARDIOVASCULAR: S1, S2 irregular. No murmurs, rubs, or gallops.  ABDOMEN: Soft, nontender, nondistended. Bowel sounds present. No organomegaly or mass.  EXTREMITIES: No pedal edema, cyanosis, or clubbing.  NEUROLOGIC: Cranial nerves II through XII are intact. Muscle strength 5/5 in all extremities. Sensation intact. Gait not checked.  PSYCHIATRIC: The patient is alert and oriented x 3.  SKIN: No obvious rash, lesion, or ulcer.   LABORATORY PANEL:   CBC Recent Labs  Lab 09/22/18 1708  WBC 14.6*  HGB 12.2  HCT 37.6  PLT 283  MCV 90.2  MCH 29.3  MCHC 32.4  RDW 14.2  LYMPHSABS 0.8  MONOABS 3.6*  EOSABS 0.2  BASOSABS 0.0   ------------------------------------------------------------------------------------------------------------------  Chemistries  No results for input(s): NA, K, CL, CO2, GLUCOSE, BUN, CREATININE, CALCIUM, MG, AST, ALT, ALKPHOS, BILITOT in the last 168 hours.  Invalid input(s): GFRCGP ------------------------------------------------------------------------------------------------------------------ CrCl cannot be calculated (Patient's most recent lab result is older than the maximum  21 days allowed.). ------------------------------------------------------------------------------------------------------------------ No results for input(s): TSH, T4TOTAL, T3FREE, THYROIDAB in the last 72 hours.  Invalid input(s): FREET3   Coagulation profile No results for input(s): INR, PROTIME in the last 168 hours. ------------------------------------------------------------------------------------------------------------------- No results for input(s): DDIMER in the last 72 hours. -------------------------------------------------------------------------------------------------------------------  Cardiac Enzymes Recent Labs  Lab 09/22/18 1708  TROPONINI 0.77*    ------------------------------------------------------------------------------------------------------------------ Invalid input(s): POCBNP  ---------------------------------------------------------------------------------------------------------------  Urinalysis    Component Value Date/Time   COLORURINE YELLOW (A) 08/07/2018 1109   APPEARANCEUR CLEAR (A) 08/07/2018 1109   LABSPEC 1.015 08/07/2018 1109   PHURINE 7.0 08/07/2018 1109   GLUCOSEU NEGATIVE 08/07/2018 1109   HGBUR NEGATIVE 08/07/2018 1109   BILIRUBINUR NEGATIVE 08/07/2018 1109   KETONESUR NEGATIVE 08/07/2018 1109   PROTEINUR 100 (A) 08/07/2018 1109   NITRITE NEGATIVE 08/07/2018 1109   LEUKOCYTESUR NEGATIVE 08/07/2018 1109     RADIOLOGY: Dg Chest Portable 1 View  Result Date: 09/22/2018 CLINICAL DATA:  85 year old female started on Lasix today for fluid overload. SVT. Cough. EXAM: PORTABLE CHEST 1 VIEW COMPARISON:  Chest radiographs 08/07/2018 and earlier. FINDINGS: Portable AP upright view at 1738 hours. Chronic hiatal hernia. Stable cardiac size and mediastinal contours. Prior CABG. Stable somewhat large lung volumes with no pneumothorax, pulmonary edema, pleural effusion or confluent pulmonary opacity. No acute osseous abnormality identified. IMPRESSION: No acute cardiopulmonary abnormality. Electronically Signed   By: Genevie Ann M.D.   On: 09/22/2018 18:21    EKG: Orders placed or performed during the hospital encounter of 09/22/18  . EKG 12-Lead  . EKG 12-Lead  . EKG 12-Lead  . EKG 12-Lead  . ED EKG 12-Lead  . ED EKG 12-Lead    IMPRESSION AND PLAN: 85 year old elderly female patient with a known history of atrial fibrillation, COPD, diastolic heart failure, hypertension, hyperlipidemia, hypothyroidism, peripheral vascular disease presented to the emergency room with increased shortness of breath.   -Acute respiratory distress with hypoxia Continue BiPAP for respiratory distress Admit patient to stepdown  unit Intensivist consultation  -Atrial fibrillation with rapid rate IV Cardizem drip for rate control IV heparin drip for anticoagulation Cardiology consult with [facility] clinic cardiology  -Systemic inflammatory response syndrome Could be from pneumonia [place] virus test negative IV antibiotics follow-up cultures Follow-up lactic acid level  -Chronic congestive heart failure diastolic Exacerbation probably secondary to A. fib  -Elevated troponin could be from demand ischemia from A. Fib Cycle troponins  All the records are reviewed and case discussed with ED provider. Management plans discussed with the patient, family and they are in agreement.  CODE STATUS:DNR    Code Status Orders  (From admission, onward)         Start     Ordered   09/22/18 1846  Do not attempt resuscitation (DNR)  Continuous    Question Answer Comment  In the event of cardiac or respiratory ARREST Do not call a "code blue"   In the event of cardiac or respiratory ARREST Do not perform Intubation, CPR, defibrillation or ACLS   In the event of cardiac or respiratory ARREST Use medication by any route, position, wound care, and other measures to relive pain and suffering. May use oxygen, suction and manual treatment of airway obstruction as needed for comfort.      09/22/18 1845        Code Status History    Date Active Date Inactive Code Status Order ID Comments User Context   09/22/2018 1840 09/22/2018 1845 Full Code [id number]  Saundra Shelling, MD ED  07/23/2017 0319 07/26/2017 2030 Full Code [id number]  Amelia Jo, MD Inpatient   07/13/2017 1943 07/14/2017 2216 Full Code [id number]  Demetrios Loll, MD ED   07/07/2017 0338 07/11/2017 1921 Full Code [id number]  Saundra Shelling, MD Inpatient   05/19/2015 1534 05/22/2015 1854 Full Code [id number]  Hessie Knows, MD Inpatient   05/18/2015 1741 05/19/2015 1534 Full Code [id number]  Loletha Grayer, MD ED   05/18/2015 1658 05/18/2015 1741 Full Code [id number]  Hessie Knows, MD ED    Advance Directive Documentation     Most Recent Value  Type of Advance Directive  Out of facility DNR (pink MOST or yellow form)  Pre-existing out of facility DNR order (yellow form or pink MOST form)  -  "MOST" Form in Place?  -     TOTAL CRITICAL CARE TIME TAKING CARE OF THIS PATIENT: 54 minutes.    Saundra Shelling M.D on 09/22/2018 at 6:47 PM  Between 7am to 6pm - Pager - [phone]  After 6pm go to [email or web address] - password [email or web address] [place] [facility]  Office  [phone]  CC: Primary care physician; Venia Carbon, MD

## 2018-09-22 NOTE — Progress Notes (Signed)
Pharmacy Antibiotic Note  Holly Clark is a 85 y.o. female admitted on 09/22/2018 with pneumonia.  Pharmacy has been consulted for Vancomycin, Cefepime  dosing.  Plan: Cefepime 1 gm IV Q12H ordered to start on 4/18 @ 2200.  Vancomycin 500 mg IV Q48H ordered to start 4/18 @ 2200.  No peak or trough currently ordered.   CrCl = 17 ml/min ke = 0.019 hr-1 T1/2 = 37.4 L  AUC = 428  VT = 11.2   Height: 5\' 1"  (154.9 cm) Weight: 96 lb 9.6 oz (43.8 kg) IBW/kg (Calculated) : 47.8  Temp (24hrs), Avg:99.9 F (37.7 C), Min:99.9 F (37.7 C), Max:99.9 F (37.7 C)  Recent Labs  Lab 09/22/18 1708 09/22/18 1838  WBC 14.6*  --   CREATININE 1.64*  --   LATICACIDVEN 2.2* 2.4*    Estimated Creatinine Clearance: 17 mL/min (A) (by C-G formula based on SCr of 1.64 mg/dL (H)).    Allergies  Allergen Reactions  . Hydrocodone Other (See Comments)  . Oxycodone Hives    Antimicrobials this admission:   >>    >>   Dose adjustments this admission:   Microbiology results:  BCx:   UCx:    Sputum:    MRSA PCR:   Thank you for allowing pharmacy to be a part of this patient's care.  Dae Highley D 09/22/2018 9:00 PM

## 2018-09-22 NOTE — Progress Notes (Signed)
ANTICOAGULATION CONSULT NOTE - Initial Consult  Pharmacy Consult for  Heparin  Indication: atrial fibrillation  Allergies  Allergen Reactions  . Hydrocodone Other (See Comments)  . Oxycodone Hives    Patient Measurements: Height: 5\' 1"  (154.9 cm) Weight: 96 lb 9.6 oz (43.8 kg) IBW/kg (Calculated) : 47.8 Heparin Dosing Weight:  43.8 kg   Vital Signs: Temp: 99.9 F (37.7 C) (04/18 1742) Temp Source: Axillary (04/18 1742) BP: 140/82 (04/18 1845) Pulse Rate: 56 (04/18 1845)  Labs: Recent Labs    09/22/18 1708  HGB 12.2  HCT 37.6  PLT 283  TROPONINI 0.77*    CrCl cannot be calculated (Patient's most recent lab result is older than the maximum 21 days allowed.).   Medical History: Past Medical History:  Diagnosis Date  . Anxiety    unspecified  . Atrial fibrillation ([place])   . COPD (chronic obstructive pulmonary disease) ([place]) 10/05/2013  . Coronary artery disease 10/05/2013  . Dementia ([place])   . Depression   . Diastolic heart failure ([place])   . Hyperlipidemia, unspecified   . Hypertension 10/05/2013  . Hypothyroidism   . Hypothyroidism, acquired    unspecified  . PVD (peripheral vascular disease) ([place]) 10/05/2013   iliac stents    Medications:  (Not in a hospital admission)   Assessment: Pharmacy consulted to dose heparin in this 85 year old female admitted with AFib.  No prior anticoag noted. CrCl = ?  Goal of Therapy:  Heparin level 0.3-0.7 units/ml Monitor platelets by anticoagulation protocol: Yes   Plan:  Give 2600 units bolus x 1 Start heparin infusion at 650 units/hr Check anti-Xa level in 8 hours and daily while on heparin Continue to monitor H&H and platelets  Kerron Sedano D 09/22/2018,7:00 PM

## 2018-09-22 NOTE — Consult Note (Signed)
PULMONARY / CRITICAL CARE MEDICINE  Name: Holly Clark MRN: [id number] DOB: 1930-03-25    LOS: 0  Referring Provider:  Dr Estanislado Pandy Reason for Referral:  Acute hypoxic respiratory failure  HPI:  This is an 85 y/o SNF resident with a medical history as indicated below who presented to the ED with dyspnea.Patient is unable to provide any history due to advanced dementia hence h/o is obtained from ED and EMS records. Per EMS, shortness of breath started a few days ago and progressively got worse. She was suppose to be started on lasix at the SNF when her respiratory status decompensated. At the ED, she was found to have a BNP of 4227, trop of 0.77, lactic acid of 2.2, wbc 14.6 and HR in the 170s. Initial EKG showed wide complex tachycardia. She has a h/o Afib but not currently on any anticoagulation. HR is improved with a diltiazem gtte. She is currently on BiPAP. At baseline, she uses oxygen at 2L [place] for CHF and COPD but her O2 needs increased today to 4L [place].    Past Medical History:  Diagnosis Date  . Anxiety    unspecified  . Atrial fibrillation ([place])   . COPD (chronic obstructive pulmonary disease) ([place]) 10/05/2013  . Coronary artery disease 10/05/2013  . Dementia ([place])   . Depression   . Diastolic heart failure ([place])   . Hyperlipidemia, unspecified   . Hypertension 10/05/2013  . Hypothyroidism   . Hypothyroidism, acquired    unspecified  . PVD (peripheral vascular disease) ([place]) 10/05/2013   iliac stents   Past Surgical History:  Procedure Laterality Date  . ABDOMINAL HYSTERECTOMY    . CARDIAC SURGERY    . CARDIOVERSION    . CATARACT EXTRACTION Bilateral 2012  . CORONARY ARTERY BYPASS GRAFT  1985   x3  . ESOPHAGOGASTRODUODENOSCOPY N/A 07/09/2017   Procedure: ESOPHAGOGASTRODUODENOSCOPY (EGD);  Surgeon: Lin Landsman, MD;  Location: [facility] ENDOSCOPY;  Service: Gastroenterology;  Laterality: N/A;  . [place]   patient unsure of which side  . HIP  PINNING,CANNULATED Right 05/19/2015   Procedure: CANNULATED HIP PINNING;  Surgeon: Hessie Knows, MD;  Location: [facility] ORS;  Service: Orthopedics;  Laterality: Right;   Prior to Admission medications   Medication Sig Start Date End Date Taking? Authorizing Provider  amLODipine (NORVASC) 5 MG tablet Take 5 mg by mouth daily.   Yes [provider]  clopidogrel (PLAVIX) 75 MG tablet Take 75 mg by mouth daily.   Yes [provider]  donepezil (ARICEPT) 5 MG tablet Take 1 tablet (5 mg total) by mouth at bedtime. 01/29/18 03/10/18 Yes Sowles, Drue Stager, MD  empagliflozin (JARDIANCE) 25 MG TABS tablet Take 25 mg by mouth daily.   Yes [provider]  glycopyrrolate (ROBINUL) 1 MG tablet Take 1 mg by mouth 2 (two) times daily.   Yes [provider]  insulin aspart (NOVOLOG FLEXPEN) 100 UNIT/ML FlexPen Inject 12 Units into the skin 2 (two) times daily.   Yes [provider]  insulin aspart (NOVOLOG) 100 UNIT/ML FlexPen Inject 18 Units into the skin daily. At 1700   Yes [provider]  Insulin Degludec-Liraglutide (XULTOPHY) 100-3.6 UNIT-MG/ML SOPN Inject 50 Units into the skin daily.   Yes [provider]  levETIRAcetam (KEPPRA) 500 MG tablet Take 500 mg by mouth 2 (two) times daily.   Yes [provider]  lipase/protease/amylase (CREON) 12000 units CPEP capsule Take 6,000 Units by mouth 3 (three) times daily before meals.  Yes [provider]  lipase/protease/amylase (CREON) 12000 units CPEP capsule Take 3,000 Units by mouth at bedtime. With snack   Yes [provider]  lisinopril (PRINIVIL,ZESTRIL) 5 MG tablet Take 5 mg by mouth daily.   Yes [provider]  metoprolol succinate (TOPROL-XL) 25 MG 24 hr tablet Take 1 tablet (25 mg total) by mouth daily. 01/29/18  Yes Sowles, Drue Stager, MD  rosuvastatin (CRESTOR) 40 MG tablet Take 1 tablet (40 mg total) by mouth daily. 01/29/18 03/10/18 Yes Steele Sizer, MD   aspirin EC 81 MG tablet Take 81 mg by mouth daily.    [provider]  famotidine (PEPCID) 20 MG tablet Take 1 tablet (20 mg total) by mouth 2 (two) times daily. 01/29/18 02/28/18  Steele Sizer, MD  gabapentin (NEURONTIN) 300 MG capsule Take 1 capsule (300 mg total) by mouth 2 (two) times daily. 01/29/18 02/28/18  Steele Sizer, MD  insulin glargine (LANTUS) 100 UNIT/ML injection Inject 0.1 mLs (10 Units total) into the skin daily. 01/29/18 02/28/18  Steele Sizer, MD  lacosamide 100 MG TABS Take 1 tablet (100 mg total) by mouth 2 (two) times daily. Patient not taking: Reported on 03/10/2018 06/16/17   Fritzi Mandes, MD  promethazine (PHENERGAN) 12.5 MG tablet Take 1 tablet (12.5 mg total) by mouth every 6 (six) hours as needed for nausea or vomiting. Patient not taking: Reported on 03/10/2018 04/04/17   Stark Klein, MD  sertraline (ZOLOFT) 25 MG tablet Take 1 tablet (25 mg total) by mouth daily. Patient not taking: Reported on 03/10/2018 01/29/18   Steele Sizer, MD   Allergies Allergies  Allergen Reactions  . Hydrocodone Other (See Comments)  . Oxycodone Hives    Family History Family History  Problem Relation Age of Onset  . Stroke Mother   . Cancer Sister   . Parkinsonism Brother    Social History  reports that she quit smoking about 14 months ago. Her smoking use included cigarettes. She has a 7.50 pack-year smoking history. She has never used smokeless tobacco. She reports current alcohol use. She reports that she does not use drugs.  Review Of Systems:  Unable to obtain due to advanced dementia  VITAL SIGNS: BP 130/70 (BP Location: Left Arm)   Pulse (!) 103   Temp 99.9 F (37.7 C) (Axillary)   Resp 20   Ht 5\' 1"  (1.549 m)   Wt 43.8 kg   SpO2 94%   BMI 18.25 kg/m   HEMODYNAMICS:    VENTILATOR SETTINGS:    INTAKE / OUTPUT: I/O last 3 completed shifts: In: 136.3 [I.V.:0.5; IV Piggyback:135.8] Out: -   PHYSICAL EXAMINATION: General: Acutely  ill-looking, in moderate respiratory distress, on BiPAP HEENT:  PERRLA, trachea midline, no JVD Neuro: AAO X3, no focal deficits Cardiovascular: AP irregular, S1/S2, no MRG, + 2 pulses, no edema Lungs: Bilateral breath Abdomen: Non-distended, normal bowel sounds in all 4 quadrants, no organomegaly on palpation Musculoskeletal:  No deformities Skin:  Venous stasis discoloration in bilateral lower extremities  LABS:  BMET No results for input(s): NA, K, CL, CO2, BUN, CREATININE, GLUCOSE in the last 168 hours.  Electrolytes No results for input(s): CALCIUM, MG, PHOS in the last 168 hours.  CBC Recent Labs  Lab 09/22/18 1708  WBC 14.6*  HGB 12.2  HCT 37.6  PLT 283    Coag's Recent Labs  Lab 09/22/18 1900  APTT 34  INR 1.2    Sepsis Markers Recent Labs  Lab 09/22/18 1708 09/22/18 1838  LATICACIDVEN 2.2* 2.4*  ABG No results for input(s): PHART, PCO2ART, PO2ART in the last 168 hours.  Liver Enzymes No results for input(s): AST, ALT, ALKPHOS, BILITOT, ALBUMIN in the last 168 hours.  Cardiac Enzymes Recent Labs  Lab 09/22/18 1708  TROPONINI 0.77*    Glucose Recent Labs  Lab 09/22/18 2018  GLUCAP 226*    Imaging Dg Chest Portable 1 View  Result Date: 09/22/2018 CLINICAL DATA:  85 year old female started on Lasix today for fluid overload. SVT. Cough. EXAM: PORTABLE CHEST 1 VIEW COMPARISON:  Chest radiographs 08/07/2018 and earlier. FINDINGS: Portable AP upright view at 1738 hours. Chronic hiatal hernia. Stable cardiac size and mediastinal contours. Prior CABG. Stable somewhat large lung volumes with no pneumothorax, pulmonary edema, pleural effusion or confluent pulmonary opacity. No acute osseous abnormality identified. IMPRESSION: No acute cardiopulmonary abnormality. Electronically Signed   By: Genevie Ann M.D.   On: 09/22/2018 18:21    STUDIES:  Last 2 D echo 07/07/2017: LV EF: 55% -   65%  CULTURES: Blood cultures x 2 Urine  culture  ANTIBIOTICS: Vancomycin CEFEPIME Azithromycin x1 Ceftriaxone x1  SIGNIFICANT EVENTS: 09/22/2018: Admitted  LINES/TUBES: PIVs  DISCUSSION: 85 Y/O female presenting with acute hypoxic respiratory failure, Afib with RVR and acute CHF exacerbation.   ASSESSMENT Acute hypoxic respiratory failure Afib with RVR  Acute dCHF exacerbation Acute COPD exacerbation CKD stage 3 Advanced dementia Hypothyroidism  PLAN Continuous BiPAP and titrate to nasal canula as tolerated Diltiazem and amiodarone infusions Monitor and correct electrolytes Gentle diuresis D/C antibiotics as patient is afebrile with a PCT <0.10 2-D echo Cardiology consult Palliative care consult for goals of care IV steroids Nebulized bronchodilators  Best Practice: Code Status:DNR Diet: NPO GI prophylaxis: ppi VTE prophylaxis:  On heparin gtte  FAMILY  - Updates: Family updated by admitting service  Sy Saintjean S. Tukov-Yual ANP-BC Pulmonary and [place] Pager [phone] or [phone]  NB: This document was prepared using Dragon voice recognition software and may include unintentional dictation errors.    09/22/2018, 8:34 PM

## 2018-09-23 ENCOUNTER — Inpatient Hospital Stay
Admit: 2018-09-23 | Discharge: 2018-09-23 | Disposition: A | Discharge status: Home / Self Care | Payer: Medicare HMO | Attending: Adult Health | Admitting: Adult Health

## 2018-09-23 LAB — BASIC METABOLIC PANEL
Anion gap: 12 (ref 5–15)
BUN: 57 mg/dL — ABNORMAL HIGH (ref 8–23)
CO2: 28 mmol/L (ref 22–32)
Calcium: 8.8 mg/dL — ABNORMAL LOW (ref 8.9–10.3)
Chloride: 102 mmol/L (ref 98–111)
Creatinine, Ser: 1.71 mg/dL — ABNORMAL HIGH (ref 0.44–1.00)
GFR calc Af Amer: 31 mL/min — ABNORMAL LOW (ref >60)
GFR calc non Af Amer: 27 mL/min — ABNORMAL LOW (ref >60)
Glucose, Bld: 271 mg/dL — ABNORMAL HIGH (ref 70–99)
Potassium: 3.3 mmol/L — ABNORMAL LOW (ref 3.5–5.1)
Sodium: 142 mmol/L (ref 135–145)

## 2018-09-23 LAB — URINALYSIS, ROUTINE W REFLEX MICROSCOPIC
Bacteria, UA: NONE SEEN
Bilirubin Urine: NEGATIVE
Glucose, UA: NEGATIVE mg/dL
Ketones, ur: NEGATIVE mg/dL
Leukocytes,Ua: NEGATIVE
Nitrite: NEGATIVE
Protein, ur: 30 mg/dL — AB
Specific Gravity, Urine: 1.01 (ref 1.005–1.030)
pH: 5 (ref 5.0–8.0)

## 2018-09-23 LAB — CBC
HCT: 36.4 % (ref 36.0–46.0)
Hemoglobin: 11.7 g/dL — ABNORMAL LOW (ref 12.0–15.0)
MCH: 28.9 pg (ref 26.0–34.0)
MCHC: 32.1 g/dL (ref 30.0–36.0)
MCV: 89.9 fL (ref 80.0–100.0)
Platelets: 253 10*3/uL (ref 150–400)
RBC: 4.05 MIL/uL (ref 3.87–5.11)
RDW: 14.1 % (ref 11.5–15.5)
WBC: 10.4 10*3/uL (ref 4.0–10.5)
nRBC: 0.3 % — ABNORMAL HIGH (ref 0.0–0.2)

## 2018-09-23 LAB — GLUCOSE, CAPILLARY
Glucose-Capillary: 113 mg/dL — ABNORMAL HIGH (ref 70–99)
Glucose-Capillary: 140 mg/dL — ABNORMAL HIGH (ref 70–99)
Glucose-Capillary: 159 mg/dL — ABNORMAL HIGH (ref 70–99)
Glucose-Capillary: 177 mg/dL — ABNORMAL HIGH (ref 70–99)
Glucose-Capillary: 207 mg/dL — ABNORMAL HIGH (ref 70–99)
Glucose-Capillary: 264 mg/dL — ABNORMAL HIGH (ref 70–99)
Glucose-Capillary: 268 mg/dL — ABNORMAL HIGH (ref 70–99)

## 2018-09-23 LAB — TROPONIN I
Troponin I: 0.59 ng/mL (ref <0.03)
Troponin I: 0.65 ng/mL (ref <0.03)

## 2018-09-23 LAB — HEPARIN LEVEL (UNFRACTIONATED)
Heparin Unfractionated: 0.34 IU/mL (ref 0.30–0.70)
Heparin Unfractionated: 0.35 IU/mL (ref 0.30–0.70)

## 2018-09-23 LAB — T4, FREE: Free T4: 1.81 ng/dL — ABNORMAL HIGH (ref 0.82–1.77)

## 2018-09-23 LAB — PHOSPHORUS: Phosphorus: 3.8 mg/dL (ref 2.5–4.6)

## 2018-09-23 LAB — MAGNESIUM: Magnesium: 2.8 mg/dL — ABNORMAL HIGH (ref 1.7–2.4)

## 2018-09-23 LAB — TSH: TSH: 0.222 u[IU]/mL — ABNORMAL LOW (ref 0.350–4.500)

## 2018-09-23 MED ORDER — SERTRALINE HCL 50 MG PO TABS
100.0000 mg | ORAL_TABLET | Freq: Every day | ORAL | Status: DC
  Administered 2018-09-23 – 2018-09-26 (×4): 100 mg via ORAL
  Filled 2018-09-23 (×4): qty 2

## 2018-09-23 MED ORDER — ORAL CARE MOUTH RINSE
15.0000 mL | Freq: Two times a day (BID) | OROMUCOSAL | Status: DC
  Administered 2018-09-26: 15 mL via OROMUCOSAL

## 2018-09-23 MED ORDER — METOPROLOL TARTRATE 25 MG PO TABS
25.0000 mg | ORAL_TABLET | Freq: Two times a day (BID) | ORAL | Status: DC
  Administered 2018-09-23 – 2018-09-25 (×5): 25 mg via ORAL
  Filled 2018-09-23 (×5): qty 1

## 2018-09-23 MED ORDER — METHYLPREDNISOLONE SODIUM SUCC 40 MG IJ SOLR
40.0000 mg | Freq: Every day | INTRAMUSCULAR | Status: DC
  Administered 2018-09-23 – 2018-09-24 (×2): 40 mg via INTRAVENOUS
  Filled 2018-09-23 (×2): qty 1

## 2018-09-23 MED ORDER — INSULIN ASPART 100 UNIT/ML ~~LOC~~ SOLN
0.0000 [IU] | SUBCUTANEOUS | Status: DC
  Administered 2018-09-23: 12:00:00 3 [IU] via SUBCUTANEOUS
  Administered 2018-09-23: 2 [IU] via SUBCUTANEOUS
  Administered 2018-09-23: 3 [IU] via SUBCUTANEOUS
  Administered 2018-09-23: 8 [IU] via SUBCUTANEOUS
  Administered 2018-09-24 (×2): 2 [IU] via SUBCUTANEOUS
  Administered 2018-09-24: 19:00:00 3 [IU] via SUBCUTANEOUS
  Administered 2018-09-24: 5 [IU] via SUBCUTANEOUS
  Administered 2018-09-25: 11 [IU] via SUBCUTANEOUS
  Administered 2018-09-25: 3 [IU] via SUBCUTANEOUS
  Administered 2018-09-25: 8 [IU] via SUBCUTANEOUS
  Filled 2018-09-23 (×11): qty 1

## 2018-09-23 MED ORDER — POTASSIUM CHLORIDE 10 MEQ/100ML IV SOLN
10.0000 meq | INTRAVENOUS | Status: AC
  Administered 2018-09-23 (×3): 10 meq via INTRAVENOUS
  Filled 2018-09-23 (×3): qty 100

## 2018-09-23 MED ORDER — CHLORHEXIDINE GLUCONATE 0.12 % MT SOLN
15.0000 mL | Freq: Two times a day (BID) | OROMUCOSAL | Status: DC
  Administered 2018-09-23 – 2018-09-26 (×6): 15 mL via OROMUCOSAL
  Filled 2018-09-23 (×6): qty 15

## 2018-09-23 MED ORDER — DILTIAZEM HCL 30 MG PO TABS
60.0000 mg | ORAL_TABLET | Freq: Four times a day (QID) | ORAL | Status: DC
  Administered 2018-09-23 – 2018-09-25 (×8): 60 mg via ORAL
  Filled 2018-09-23 (×7): qty 2
  Filled 2018-09-23: qty 1

## 2018-09-23 MED ORDER — FUROSEMIDE 10 MG/ML IJ SOLN
40.0000 mg | Freq: Every day | INTRAMUSCULAR | Status: DC
  Administered 2018-09-23 – 2018-09-26 (×4): 40 mg via INTRAVENOUS
  Filled 2018-09-23 (×4): qty 4

## 2018-09-23 MED ORDER — ASPIRIN EC 81 MG PO TBEC
81.0000 mg | DELAYED_RELEASE_TABLET | Freq: Every day | ORAL | Status: DC
  Administered 2018-09-23 – 2018-09-26 (×4): 81 mg via ORAL
  Filled 2018-09-23 (×4): qty 1

## 2018-09-23 MED ORDER — ENSURE ENLIVE PO LIQD
237.0000 mL | Freq: Two times a day (BID) | ORAL | Status: DC
  Administered 2018-09-23 – 2018-09-26 (×6): 237 mL via ORAL

## 2018-09-23 NOTE — Progress Notes (Signed)
Per Dr. Clayborn Bigness, after pt's 1800 PO diltiazem dose, turn dlitz gtt off.

## 2018-09-23 NOTE — Progress Notes (Signed)
Spoke with dr. Clayborn Bigness regarding patient heart rate 90-mid 110's occasionally 120-130's. Per md okay to discontinue cardizem drip. md aware scheduled dose of metoprolol was given at 1700, per md schedule next dose to be given at 2200 tonight. Will continue to monitor

## 2018-09-23 NOTE — Progress Notes (Addendum)
PT Cancellation Note  Patient Details Name: Holly Clark MRN: [id number] DOB: 1929-09-10   Cancelled Treatment:    Reason Eval/Treat Not Completed: Medical issues which prohibited therapy(Chart reviewed, noted uptrending Troponin. Also DBP >123mmHg. Will hold PT evaluation until pt is medically appropriate. )  1:27 PM, 09/23/18 Etta Grandchild, PT, DPT Physical Therapist - [facility]  [phone] ([place])    [name] 09/23/2018, 1:27 PM

## 2018-09-23 NOTE — Progress Notes (Signed)
[place] at [place] NAME: Gayna Braddy    MR#:  [id number]  DATE OF BIRTH:  1930-04-17  SUBJECTIVE:  Patient agitated this morning.  Has dementia.  REVIEW OF SYSTEMS:    Unable to obtain due to dementia   Tolerating Diet:npo     DRUG ALLERGIES:   Allergies  Allergen Reactions  . Hydrocodone Other (See Comments)  . Oxycodone Hives    VITALS:  Blood pressure (!) 115/96, pulse 93, temperature 98.8 F (37.1 C), temperature source Axillary, resp. rate (!) 22, height 5\' 1"  (1.549 m), weight 41.7 kg, SpO2 95 %.  PHYSICAL EXAMINATION:  Constitutional: Appears frail appearing on BiPAP agitated. HENT: Normocephalic. Marland Kitchen Oropharynx is clear and moist.  Eyes: Conjunctivae and EOM are normal. PERRLA, no scleral icterus.  Neck: Normal ROM. Neck supple. No JVD. No tracheal deviation. CVS: RRR, S1/S2 +, no murmurs, no gallops, no carotid bruit.  Pulmonary:bilateral rhonchi  abdominal: Soft. BS +,  no distension, tenderness, rebound or guarding.  Musculoskeletal: Normal range of motion. N edema and no tenderness.  Neuro: Alert.  Moves all extremities without obvious focal deficit. Skin: Skin is warm and dry. No rash noted. Psychiatric: Agitated with dementia    LABORATORY PANEL:   CBC Recent Labs  Lab 09/23/18 0434  WBC 10.4  HGB 11.7*  HCT 36.4  PLT 253   ------------------------------------------------------------------------------------------------------------------  Chemistries  Recent Labs  Lab 09/22/18 1708 09/23/18 0434  NA 142 142  K 3.2* 3.3*  CL 101 102  CO2 28 28  GLUCOSE 183* 271*  BUN 59* 57*  CREATININE 1.64* 1.71*  CALCIUM 9.1 8.8*  MG 2.5* 2.8*  AST 27  --   ALT 16  --   ALKPHOS 122  --   BILITOT 0.8  --    ------------------------------------------------------------------------------------------------------------------  Cardiac Enzymes Recent Labs  Lab 09/22/18 1708 09/22/18 2158  09/23/18 0434  TROPONINI 0.77* 0.66* 0.59*   ------------------------------------------------------------------------------------------------------------------  RADIOLOGY:  Dg Chest Portable 1 View  Result Date: 09/22/2018 CLINICAL DATA:  85 year old female started on Lasix today for fluid overload. SVT. Cough. EXAM: PORTABLE CHEST 1 VIEW COMPARISON:  Chest radiographs 08/07/2018 and earlier. FINDINGS: Portable AP upright view at 1738 hours. Chronic hiatal hernia. Stable cardiac size and mediastinal contours. Prior CABG. Stable somewhat large lung volumes with no pneumothorax, pulmonary edema, pleural effusion or confluent pulmonary opacity. No acute osseous abnormality identified. IMPRESSION: No acute cardiopulmonary abnormality. Electronically Signed   By: Genevie Ann M.D.   On: 09/22/2018 18:21     ASSESSMENT AND PLAN:    85 year old female with history of dementia, atrial fibrillation and chronic diastolic heart failure who presents from skilled nursing facility due to shortness of breath and hypoxia.  1.  Acute on chronic hypoxic respiratory failure in the setting of acute CHF exacerbation: Wean to 2 L as tolerated Patient ruled out for COVID-19.  2.  Acute on chronic diastolic heart failure: Continue Lasix Monitor intake and output Only -261cc  3.  Atrial fibrillation with RVR: Continue monitoring  cardiology consultation placed Wean diltiazem drip Continue heparin drip for now  4.  History of hypothyroidism not taking medications at this time with decreased TSH on labs: Check T3 and T4  5.  Elevated troponin: Likely in the setting of demand ischemia from above issues Trending down  6.  Dementia: Watch for ICU delirium Consider adding Risperdal for agitation  7.  Protein calorie malnutrition on feeding supplement  8.  Hypokalemia: Replace and  recheck in a.m.  Patient would benefit from palliative care consultation.   CODE DNR  TOTAL TIME TAKING CARE OF THIS PATIENT:  30 minutes.     POSSIBLE D/C 2-4 days, DEPENDING ON CLINICAL CONDITION.   Bettey Costa M.D on 09/23/2018 at 10:40 AM  Between 7am to 6pm - Pager - [phone] After 6pm go to [email or web address] - password [email or web address] [facility]  Office  [phone]  CC: Primary care physician; Venia Carbon, MD  Note: This dictation was prepared with Dragon dictation along with smaller phrase technology. Any transcriptional errors that result from this process are unintentional.

## 2018-09-23 NOTE — Progress Notes (Signed)
*  PRELIMINARY RESULTS* Echocardiogram 2D Echocardiogram has been performed.  Holly Clark Toia Micale 09/23/2018, 9:24 AM

## 2018-09-23 NOTE — Progress Notes (Signed)
ANTICOAGULATION CONSULT NOTE - Initial Consult  Pharmacy Consult for  Heparin  Indication: atrial fibrillation  Allergies  Allergen Reactions  . Hydrocodone Other (See Comments)  . Oxycodone Hives    Patient Measurements: Height: 5\' 1"  (154.9 cm) Weight: 91 lb 14.9 oz (41.7 kg) IBW/kg (Calculated) : 47.8 Heparin Dosing Weight:  43.8 kg   Vital Signs: Temp: 98.8 F (37.1 C) (04/19 0100) Temp Source: Axillary (04/19 0100) BP: 129/67 (04/19 0400) Pulse Rate: 43 (04/19 0400)  Labs: Recent Labs    09/22/18 1708 09/22/18 1900 09/22/18 2158 09/23/18 0434  HGB 12.2  --   --  11.7*  HCT 37.6  --   --  36.4  PLT 283  --   --  253  APTT  --  34  --   --   LABPROT  --  15.0  --   --   INR  --  1.2  --   --   HEPARINUNFRC  --   --   --  0.34  CREATININE 1.64*  --   --   --   TROPONINI 0.77*  --  0.66*  --     Estimated Creatinine Clearance: 16.2 mL/min (A) (by C-G formula based on SCr of 1.64 mg/dL (H)).   Medical History: Past Medical History:  Diagnosis Date  . Anxiety    unspecified  . Atrial fibrillation ([place])   . COPD (chronic obstructive pulmonary disease) ([place]) 10/05/2013  . Coronary artery disease 10/05/2013  . Dementia ([place])   . Depression   . Diastolic heart failure ([place])   . Hyperlipidemia, unspecified   . Hypertension 10/05/2013  . Hypothyroidism   . Hypothyroidism, acquired    unspecified  . PVD (peripheral vascular disease) ([place]) 10/05/2013   iliac stents    Medications:  Medications Prior to Admission  Medication Sig Dispense Refill Last Dose  . aspirin EC 81 MG tablet Take 81 mg by mouth daily.    09/22/2018 at 0800  . furosemide (LASIX) 40 MG tablet Take 40 mg by mouth daily.   09/22/2018 at 1500  . guaifenesin (ROBITUSSIN) 100 MG/5ML syrup Take 200 mg by mouth every 4 (four) hours as needed for cough.   09/22/2018 at 1013  . sertraline (ZOLOFT) 100 MG tablet Take 100 mg by mouth daily.    09/22/2018 at 0800  . Vitamin D, Ergocalciferol,  (DRISDOL) 1.25 MG (50000 UT) CAPS capsule Take 50,000 Units by mouth every 30 (thirty) days.   09/12/2018 at 2000  . docusate sodium (COLACE) 100 MG capsule Take 1 capsule (100 mg total) by mouth 2 (two) times daily. 10 capsule 0 prn at prn  . feeding supplement, ENSURE ENLIVE, (ENSURE ENLIVE) LIQD Take 237 mLs by mouth 2 (two) times daily. give with a meal for nutrition   Taking  . levothyroxine (SYNTHROID, LEVOTHROID) 75 MCG tablet Take 1 tablet (75 mcg total) by mouth daily before breakfast. (Patient not taking: Reported on 09/22/2018) 30 tablet 0 Not Taking at Unknown time  . nitroGLYCERIN (NITROSTAT) 0.4 MG SL tablet Place 0.4 mg under the tongue every 5 (five) minutes as needed for chest pain.   prn at prn  . Nutritional Supplements (NUTRITIONAL SUPPLEMENT PO) Diet Type: Regular consistency, NAS   Taking  . OXYGEN Inhale 2 L into the lungs continuous as needed.    Taking  . pantoprazole (PROTONIX) 40 MG tablet Take 1 tablet (40 mg total) by mouth 2 (two) times daily. (Patient not taking: Reported on 09/22/2018) 60 tablet  0 Not Taking at Unknown time  . polyethylene glycol (MIRALAX / GLYCOLAX) packet Take 17 g by mouth daily.   prn at prn    Assessment: Pharmacy consulted to dose heparin in this 85 year old female admitted with AFib.  No prior anticoag noted.  Goal of Therapy:  Heparin level 0.3-0.7 units/ml Monitor platelets by anticoagulation protocol: Yes   Plan:  4/19 @ 0434 HL 0.34. Level is therapeutic x1. Continue current heparin infusion  Rate of 650 units/hr Reheck confirmatory level anti-Xa level in 8 hours.  Continue to monitor anti-Xa  And CBC daily while on heparin.  Pernell Dupre, PharmD, BCPS Clinical Pharmacist 09/23/2018 5:17 AM

## 2018-09-23 NOTE — Progress Notes (Signed)
Spoke with dr. Clayborn Bigness to make aware patient heart rate continues to range from 90-140's occasionally 170's. Per md started metoprolol tartrate 25mg  po bid, give first dose now along with scheduled po cardizem. Continue to monitor heart if still sustaining 120's md will assess medications and continue of drip. Will continue to monitor

## 2018-09-23 NOTE — Progress Notes (Addendum)
Maggie, NP notified of CBG result 264, acknowledged. Awaiting new orders.

## 2018-09-23 NOTE — Progress Notes (Signed)
ANTICOAGULATION CONSULT NOTE - Initial Consult  Pharmacy Consult for  Heparin  Indication: atrial fibrillation  Allergies  Allergen Reactions  . Hydrocodone Other (See Comments)  . Oxycodone Hives    Patient Measurements: Height: 5\' 1"  (154.9 cm) Weight: 91 lb 14.9 oz (41.7 kg) IBW/kg (Calculated) : 47.8 Heparin Dosing Weight:  43.8 kg   Vital Signs: Temp: 98.5 F (36.9 C) (04/19 1200) Temp Source: Axillary (04/19 1200) BP: 105/91 (04/19 1300) Pulse Rate: 74 (04/19 0900)  Labs: Recent Labs    09/22/18 1708 09/22/18 1900 09/22/18 2158 09/23/18 0434 09/23/18 1144  HGB 12.2  --   --  11.7*  --   HCT 37.6  --   --  36.4  --   PLT 283  --   --  253  --   APTT  --  34  --   --   --   LABPROT  --  15.0  --   --   --   INR  --  1.2  --   --   --   HEPARINUNFRC  --   --   --  0.34 0.35  CREATININE 1.64*  --   --  1.71*  --   TROPONINI 0.77*  --  0.66* 0.59* 0.65*    Estimated Creatinine Clearance: 15.5 mL/min (A) (by C-G formula based on SCr of 1.71 mg/dL (H)).   Medical History: Past Medical History:  Diagnosis Date  . Anxiety    unspecified  . Atrial fibrillation ([place])   . COPD (chronic obstructive pulmonary disease) ([place]) 10/05/2013  . Coronary artery disease 10/05/2013  . Dementia ([place])   . Depression   . Diastolic heart failure ([place])   . Hyperlipidemia, unspecified   . Hypertension 10/05/2013  . Hypothyroidism   . Hypothyroidism, acquired    unspecified  . PVD (peripheral vascular disease) ([place]) 10/05/2013   iliac stents    Medications:  Medications Prior to Admission  Medication Sig Dispense Refill Last Dose  . aspirin EC 81 MG tablet Take 81 mg by mouth daily.    09/22/2018 at 0800  . furosemide (LASIX) 40 MG tablet Take 40 mg by mouth daily.   09/22/2018 at 1500  . guaifenesin (ROBITUSSIN) 100 MG/5ML syrup Take 200 mg by mouth every 4 (four) hours as needed for cough.   09/22/2018 at 1013  . sertraline (ZOLOFT) 100 MG tablet Take 100 mg by mouth  daily.    09/22/2018 at 0800  . Vitamin D, Ergocalciferol, (DRISDOL) 1.25 MG (50000 UT) CAPS capsule Take 50,000 Units by mouth every 30 (thirty) days.   09/12/2018 at 2000  . docusate sodium (COLACE) 100 MG capsule Take 1 capsule (100 mg total) by mouth 2 (two) times daily. 10 capsule 0 prn at prn  . feeding supplement, ENSURE ENLIVE, (ENSURE ENLIVE) LIQD Take 237 mLs by mouth 2 (two) times daily. give with a meal for nutrition   Taking  . levothyroxine (SYNTHROID, LEVOTHROID) 75 MCG tablet Take 1 tablet (75 mcg total) by mouth daily before breakfast. (Patient not taking: Reported on 09/22/2018) 30 tablet 0 Not Taking at Unknown time  . nitroGLYCERIN (NITROSTAT) 0.4 MG SL tablet Place 0.4 mg under the tongue every 5 (five) minutes as needed for chest pain.   prn at prn  . Nutritional Supplements (NUTRITIONAL SUPPLEMENT PO) Diet Type: Regular consistency, NAS   Taking  . OXYGEN Inhale 2 L into the lungs continuous as needed.    Taking  . pantoprazole (PROTONIX) 40 MG  tablet Take 1 tablet (40 mg total) by mouth 2 (two) times daily. (Patient not taking: Reported on 09/22/2018) 60 tablet 0 Not Taking at Unknown time  . polyethylene glycol (MIRALAX / GLYCOLAX) packet Take 17 g by mouth daily.   prn at prn    Assessment: Pharmacy consulted to dose heparin in this 85 year old female admitted with AFib.  No prior anticoag noted.  4/19 0434 HL 0.34 4/19 1144 HL 0.35  Goal of Therapy:  Heparin level 0.3-0.7 units/ml Monitor platelets by anticoagulation protocol: Yes   Plan:  4/19 @ 1144 HL 0.35. Level is therapeutic x 2. Continue current heparin infusion  Rate of 650 units/hr Next Heparin level with AM labs.  Continue to monitor anti-Xa  And CBC daily while on heparin.  Paulina Fusi, PharmD, BCPS 09/23/2018 2:08 PM

## 2018-09-24 DIAGNOSIS — Z66 Do not resuscitate: Secondary | ICD-10-CM

## 2018-09-24 DIAGNOSIS — Z7189 Other specified counseling: Secondary | ICD-10-CM

## 2018-09-24 DIAGNOSIS — Z515 Encounter for palliative care: Secondary | ICD-10-CM

## 2018-09-24 LAB — ECHOCARDIOGRAM COMPLETE
Height: 61 in
Weight: 1470.91 oz

## 2018-09-24 LAB — BASIC METABOLIC PANEL
Anion gap: 14 (ref 5–15)
BUN: 73 mg/dL — ABNORMAL HIGH (ref 8–23)
CO2: 29 mmol/L (ref 22–32)
Calcium: 9.4 mg/dL (ref 8.9–10.3)
Chloride: 103 mmol/L (ref 98–111)
Creatinine, Ser: 2.27 mg/dL — ABNORMAL HIGH (ref 0.44–1.00)
GFR calc Af Amer: 22 mL/min — ABNORMAL LOW (ref >60)
GFR calc non Af Amer: 19 mL/min — ABNORMAL LOW (ref >60)
Glucose, Bld: 130 mg/dL — ABNORMAL HIGH (ref 70–99)
Potassium: 3.7 mmol/L (ref 3.5–5.1)
Sodium: 146 mmol/L — ABNORMAL HIGH (ref 135–145)

## 2018-09-24 LAB — GLUCOSE, CAPILLARY
Glucose-Capillary: 114 mg/dL — ABNORMAL HIGH (ref 70–99)
Glucose-Capillary: 131 mg/dL — ABNORMAL HIGH (ref 70–99)
Glucose-Capillary: 125 mg/dL — ABNORMAL HIGH (ref 70–99)
Glucose-Capillary: 179 mg/dL — ABNORMAL HIGH (ref 70–99)
Glucose-Capillary: 128 mg/dL — ABNORMAL HIGH (ref 70–99)
Glucose-Capillary: 190 mg/dL — ABNORMAL HIGH (ref 70–99)

## 2018-09-24 LAB — CBC
HCT: 35.4 % — ABNORMAL LOW (ref 36.0–46.0)
Hemoglobin: 11.1 g/dL — ABNORMAL LOW (ref 12.0–15.0)
MCH: 28.9 pg (ref 26.0–34.0)
MCHC: 31.4 g/dL (ref 30.0–36.0)
MCV: 92.2 fL (ref 80.0–100.0)
Platelets: 335 10*3/uL (ref 150–400)
RBC: 3.84 MIL/uL — ABNORMAL LOW (ref 3.87–5.11)
RDW: 14.3 % (ref 11.5–15.5)
WBC: 14.8 10*3/uL — ABNORMAL HIGH (ref 4.0–10.5)
nRBC: 0 % (ref 0.0–0.2)

## 2018-09-24 LAB — HEPARIN LEVEL (UNFRACTIONATED)
Heparin Unfractionated: 0.26 IU/mL — ABNORMAL LOW (ref 0.30–0.70)
Heparin Unfractionated: 0.54 IU/mL (ref 0.30–0.70)
Heparin Unfractionated: 0.47 IU/mL (ref 0.30–0.70)

## 2018-09-24 MED ORDER — PREDNISONE 50 MG PO TABS
50.0000 mg | ORAL_TABLET | Freq: Every day | ORAL | Status: DC
  Administered 2018-09-25 – 2018-09-26 (×2): 50 mg via ORAL
  Filled 2018-09-24 (×2): qty 1

## 2018-09-24 MED ORDER — SODIUM CHLORIDE 0.9 % IV SOLN
1.0000 g | INTRAVENOUS | Status: DC
  Administered 2018-09-24: 1 g via INTRAVENOUS
  Filled 2018-09-24 (×2): qty 10

## 2018-09-24 MED ORDER — POLYETHYLENE GLYCOL 3350 17 G PO PACK
17.0000 g | PACK | Freq: Once | ORAL | Status: AC
  Administered 2018-09-24: 17 g via ORAL
  Filled 2018-09-24: qty 1

## 2018-09-24 MED ORDER — ADULT MULTIVITAMIN W/MINERALS CH
1.0000 | ORAL_TABLET | Freq: Every day | ORAL | Status: DC
  Administered 2018-09-24 – 2018-09-26 (×3): 1 via ORAL
  Filled 2018-09-24 (×3): qty 1

## 2018-09-24 NOTE — Progress Notes (Signed)
[place] at [place] NAME: Mishika Flippen    MR#:  [id number]  DATE OF BIRTH:  1929-06-28  SUBJECTIVE:  Patient has baseline dementia is resting comfortably  REVIEW OF SYSTEMS:    Unable to obtain due to dementia   Tolerating Diet:npo     DRUG ALLERGIES:   Allergies  Allergen Reactions  . Hydrocodone Other (See Comments)  . Oxycodone Hives    VITALS:  Blood pressure 121/72, pulse 68, temperature 98 F (36.7 C), temperature source Oral, resp. rate 16, height 5\' 1"  (1.549 m), weight 41.3 kg, SpO2 98 %.  PHYSICAL EXAMINATION:  Constitutional: Appears frail appearing on BiPAP agitated. HENT: Normocephalic. Marland Kitchen Oropharynx is clear and moist.  Eyes: Conjunctivae and EOM are normal. PERRLA, no scleral icterus.  Neck: Normal ROM. Neck supple. No JVD. No tracheal deviation. CVS: RRR, S1/S2 +, no murmurs, no gallops, no carotid bruit.  Pulmonary:bilateral rhonchi  abdominal: Soft. BS +,  no distension, tenderness, rebound or guarding.  Musculoskeletal: Normal range of motion. N edema and no tenderness.  Neuro: Alert.  Moves all extremities without obvious focal deficit. Skin: Skin is warm and dry. No rash noted. Psychiatric: Agitated with dementia    LABORATORY PANEL:   CBC Recent Labs  Lab 09/24/18 0552  WBC 14.8*  HGB 11.1*  HCT 35.4*  PLT 335   ------------------------------------------------------------------------------------------------------------------  Chemistries  Recent Labs  Lab 09/22/18 1708 09/23/18 0434 09/24/18 0552  NA 142 142 146*  K 3.2* 3.3* 3.7  CL 101 102 103  CO2 28 28 29   GLUCOSE 183* 271* 130*  BUN 59* 57* 73*  CREATININE 1.64* 1.71* 2.27*  CALCIUM 9.1 8.8* 9.4  MG 2.5* 2.8*  --   AST 27  --   --   ALT 16  --   --   ALKPHOS 122  --   --   BILITOT 0.8  --   --    ------------------------------------------------------------------------------------------------------------------  Cardiac  Enzymes Recent Labs  Lab 09/22/18 2158 09/23/18 0434 09/23/18 1144  TROPONINI 0.66* 0.59* 0.65*   ------------------------------------------------------------------------------------------------------------------  RADIOLOGY:  Dg Chest Portable 1 View  Result Date: 09/22/2018 CLINICAL DATA:  85 year old female started on Lasix today for fluid overload. SVT. Cough. EXAM: PORTABLE CHEST 1 VIEW COMPARISON:  Chest radiographs 08/07/2018 and earlier. FINDINGS: Portable AP upright view at 1738 hours. Chronic hiatal hernia. Stable cardiac size and mediastinal contours. Prior CABG. Stable somewhat large lung volumes with no pneumothorax, pulmonary edema, pleural effusion or confluent pulmonary opacity. No acute osseous abnormality identified. IMPRESSION: No acute cardiopulmonary abnormality. Electronically Signed   By: Genevie Ann M.D.   On: 09/22/2018 18:21     ASSESSMENT AND PLAN:    85 year old female with history of dementia, atrial fibrillation and chronic diastolic heart failure who presents from skilled nursing facility due to shortness of breath and hypoxia.  1.  Acute on chronic hypoxic respiratory failure in the setting of acute CHF exacerbation: Wean to 2 L as tolerated Patient ruled out for COVID-19. Solu-Medrol changed to p.o. prednisone  2.  Acute on chronic diastolic heart failure with AKI Continue Lasix dose reduced to 40 mg from 60 as  renal function is getting worse Monitor intake and output Nephrotoxins to be avoided BMP in a.m.  3.  Atrial fibrillation with RVR: Continue monitoring  cardiology consultation placed, secure chat message sent to cardiology wean diltiazem drip Continue heparin drip for now  4.  History of hypothyroidism not taking medications at this  time with decreased TSH on labs: Check T3 and T4  5.  Elevated troponin: Likely in the setting of demand ischemia from above issues Trending down  6.  Dementia: Watch for ICU delirium Consider adding  Risperdal for agitation  7.  Protein calorie malnutrition on feeding supplement  8.  Hypokalemia: Replace and recheck 3.7  9.  Urinary tract infection with Enterococcus faecalis Start Rocephin final cultures are pending  Patient would benefit from palliative care consultation.   CODE DNR  TOTAL TIME TAKING CARE OF THIS PATIENT: 33 minutes.     POSSIBLE D/C 2-3 days, DEPENDING ON CLINICAL CONDITION.   Nicholes Mango M.D on 09/24/2018 at 12:34 PM  Between 7am to 6pm - Pager - [phone]  After 6pm go to [email or web address] - password [email or web address] [facility]  Office  [phone]  CC: Primary care physician; Venia Carbon, MD  Note: This dictation was prepared with Dragon dictation along with smaller phrase technology. Any transcriptional errors that result from this process are unintentional.

## 2018-09-24 NOTE — Evaluation (Addendum)
Physical Therapy Evaluation Patient Details Name: Holly Clark MRN: [id number] DOB: Jun 08, 1929 Today's Date: 09/24/2018   History of Present Illness  Holly Clark is an 85yo female who comes [facility] from [facility] (not memory care as mentioned in ED notes) on 4/18 d/t irregular tachycardia. Pt had elevated BNP and elevated troponin, cardiology reporting inconsistent with ACS on 4/20.   Clinical Impression  Pt admitted with above diagnosis. Pt currently with functional limitations due to the deficits listed below (see "PT Problem List"). Upon entry, pt in bed asleep. The pt is startled awake after author's greeting, appears alert thereafter.  Additional time required for responding to orientation questions, pt able to reports with delayed response, her name, that she is from [place], [place] (unclear if confabulated), and that she lives in [place]. Pt unable to provide responses to any other questioning. Cues given several times for mobility to EOB, pt appears to understand, but never truly initiates. Tactile cues are given for mobility to EOB, pt noted to be attempting to provide a contribution, but largely weak and unable to provide much work. At EOB, pt collapses backward and/or left several times, seemingly without any awareness, maintains eyes open, but staring off into space each time. Pt given cues that she is falling over and that she should correct, but she argues with Pryor Curia saying that she is not falling over. Unsafe to attempt transfers at this time. Attempted to contact [facility] to establish PLOF, unable to get in touch with anyone. Functional mobility assessment demonstrates increased absolute need for total physical assistance, unclear tolerance, whereas the patient performed these presumably at a higher level of independence PTA, as she would have needed to be to stay at care setting. Pt will benefit from skilled PT intervention to increase independence and safety with basic  mobility in preparation for discharge to the venue listed below.       Follow Up Recommendations SNF;Supervision/Assistance - 24 hour    Equipment Recommendations  None recommended by PT    Recommendations for Other Services       Precautions / Restrictions Precautions Precautions: Fall Restrictions Weight Bearing Restrictions: No      Mobility  Bed Mobility Overal bed mobility: Needs Assistance Bed Mobility: Supine to Sit;Sit to Supine     Supine to sit: Total assist Sit to supine: Total assist;+2 for physical assistance   General bed mobility comments: Unable to sit independently at EOB, continues to go flaccid and flat backward and/or left, no apparent awareness, eyes remain open each time.   Transfers Overall transfer level: (no safe to attempt at this time)                  Ambulation/Gait                Stairs            Wheelchair Mobility    Modified Rankin (Stroke Patients Only)       Balance Overall balance assessment: Needs assistance Sitting-balance support: Bilateral upper extremity supported;Feet supported Sitting balance-Leahy Scale: Zero Sitting balance - Comments:  No clear awareness of falling over at EOB                                     Pertinent Vitals/Pain Pain Assessment: No/denies pain    Home Living Family/patient expects to be discharged to:: Skilled nursing facility([facility])  Prior Function Level of Independence: (Unknown at this time)               Hand Dominance        Extremity/Trunk Assessment             Cervical / Trunk Assessment Cervical / Trunk Assessment: Normal(low postural tone at EOB, unable to sit independently)  Communication      Cognition Arousal/Alertness: Lethargic Behavior During Therapy: Flat affect(AMS) Overall Cognitive Status: Impaired/Different from baseline Area of Impairment:  Orientation;Awareness;Following commands                 Orientation Level: Disoriented to;Place;Time;Situation     Following Commands: Follows one step commands inconsistently;Follows one step commands with increased time   Awareness: Intellectual;Emergent;Anticipatory          General Comments      Exercises     Assessment/Plan    PT Assessment Patient needs continued PT services  PT Problem List Decreased strength;Decreased cognition;Decreased activity tolerance;Decreased balance;Decreased mobility;Decreased safety awareness       PT Treatment Interventions Therapeutic exercise;Balance training;Functional mobility training;Therapeutic activities;Cognitive remediation;Neuromuscular re-education    PT Goals (Current goals can be found in the Care Plan section)  Acute Rehab PT Goals PT Goal Formulation: Patient unable to participate in goal setting Time For Goal Achievement: 10/08/18    Frequency Min 2X/week   Barriers to discharge        Co-evaluation               AM-PAC PT "6 Clicks" Mobility  Outcome Measure Help needed turning from your back to your side while in a flat bed without using bedrails?: Total Help needed moving from lying on your back to sitting on the side of a flat bed without using bedrails?: Total Help needed moving to and from a bed to a chair (including a wheelchair)?: Total Help needed standing up from a chair using your arms (e.g., wheelchair or bedside chair)?: Total Help needed to walk in hospital room?: Total Help needed climbing 3-5 steps with a railing? : Total 6 Click Score: 6    End of Session Equipment Utilized During Treatment: Oxygen Activity Tolerance: Patient limited by fatigue;Other (comment)(mental status likely a limiting factor) Patient left: in bed;with bed alarm set;with call bell/phone within reach   PT Visit Diagnosis: Muscle weakness (generalized) (M62.81);Other abnormalities of gait and mobility  (R26.89)    Time: 1525-1540 PT Time Calculation (min) (ACUTE ONLY): 15 min   Charges:   PT Evaluation $PT Eval Moderate Complexity: 1 Mod           4:30 PM, 09/24/18 Etta Grandchild, PT, DPT Physical Therapist - [facility]  [phone] ([place])    , C 09/24/2018, 4:21 PM

## 2018-09-24 NOTE — Consult Note (Signed)
[facility] Consultation Note  Patient ID: Holly Clark, MRN: [id number], DOB/AGE: 83-Jan-1931 83 y.o. Admit date: 09/22/2018   Date of Consult: 09/24/2018 Primary Physician: Venia Carbon, MD Primary Cardiologist: Ubaldo Glassing  Chief Complaint:  Chief Complaint  Patient presents with  . Shortness of Breath   Reason for Consult: Atrial fibrillation rapid ventricular rhythm heart failure  HPI: 85 y.o. female with known paroxysmal nonvalvular atrial fibrillation coronary artery disease essential hypertension mixed hyperlipidemia chronic kidney disease stage IV anemia with acute on chronic diastolic dysfunction congestive heart failure with pulmonary edema and effusions.  The patient was admitted to the hospital for [place] acute respiratory failure due to pulmonary edema and hypoxia.  Additionally had an EKG showing atrial fibrillation with rapid ventricular rate with right bundle branch block now spontaneously converted to normal rhythm with appropriate medication management including diltiazem and metoprolol combination.  The patient did have a BNP of 4227 and a troponin of 0.77 consistent with demand ischemia rather than acute coronary syndrome.  Echocardiogram showed normal LV systolic function ejection fraction is 60% and no evidence of significant valvular heart disease issues.  The patient currently has significant improvements of symptoms and more hemodynamically stable at this time  Past Medical History:  Diagnosis Date  . Anxiety    unspecified  . Atrial fibrillation ([place])   . COPD (chronic obstructive pulmonary disease) ([place]) 10/05/2013  . Coronary artery disease 10/05/2013  . Dementia ([place])   . Depression   . Diastolic heart failure ([place])   . Hyperlipidemia, unspecified   . Hypertension 10/05/2013  . Hypothyroidism   . Hypothyroidism, acquired    unspecified  . PVD (peripheral vascular disease) ([place]) 10/05/2013   iliac stents      Surgical History:  Past Surgical  History:  Procedure Laterality Date  . ABDOMINAL HYSTERECTOMY    . CARDIAC SURGERY    . CARDIOVERSION    . CATARACT EXTRACTION Bilateral 2012  . CORONARY ARTERY BYPASS GRAFT  1985   x3  . ESOPHAGOGASTRODUODENOSCOPY N/A 07/09/2017   Procedure: ESOPHAGOGASTRODUODENOSCOPY (EGD);  Surgeon: Lin Landsman, MD;  Location: [facility] ENDOSCOPY;  Service: Gastroenterology;  Laterality: N/A;  . [place]   patient unsure of which side  . HIP PINNING,CANNULATED Right 05/19/2015   Procedure: CANNULATED HIP PINNING;  Surgeon: Hessie Knows, MD;  Location: [facility] ORS;  Service: Orthopedics;  Laterality: Right;     Home Meds: Prior to Admission medications   Medication Sig Start Date End Date Taking? Authorizing Provider  aspirin EC 81 MG tablet Take 81 mg by mouth daily.    Yes [provider]  furosemide (LASIX) 40 MG tablet Take 40 mg by mouth daily.   Yes [provider]  guaifenesin (ROBITUSSIN) 100 MG/5ML syrup Take 200 mg by mouth every 4 (four) hours as needed for cough.   Yes [provider]  sertraline (ZOLOFT) 100 MG tablet Take 100 mg by mouth daily.  05/08/18  Yes [provider]  Vitamin D, Ergocalciferol, (DRISDOL) 1.25 MG (50000 UT) CAPS capsule Take 50,000 Units by mouth every 30 (thirty) days.   Yes [provider]  docusate sodium (COLACE) 100 MG capsule Take 1 capsule (100 mg total) by mouth 2 (two) times daily. 05/22/15   Epifanio Lesches, MD  feeding supplement, ENSURE ENLIVE, (ENSURE ENLIVE) LIQD Take 237 mLs by mouth 2 (two) times daily. give with a meal for nutrition    [provider]  levothyroxine (SYNTHROID, [place]) 75 MCG  tablet Take 1 tablet (75 mcg total) by mouth daily before breakfast. Patient not taking: Reported on 09/22/2018 07/14/17   Vaughan Basta, MD  nitroGLYCERIN (NITROSTAT) 0.4 MG SL tablet Place 0.4 mg under the tongue every 5 (five) minutes as needed for chest pain.    [provider]  Nutritional Supplements (NUTRITIONAL SUPPLEMENT PO) Diet Type: Regular consistency, NAS 07/11/18   [provider]  OXYGEN Inhale 2 L into the lungs continuous as needed.     [provider]  pantoprazole (PROTONIX) 40 MG tablet Take 1 tablet (40 mg total) by mouth 2 (two) times daily. Patient not taking: Reported on 09/22/2018 07/10/17   Vaughan Basta, MD  polyethylene glycol [facility] / Floria Raveling) packet Take 17 g by mouth daily. 03/22/18   [provider]    Inpatient Medications:  . aspirin EC  81 mg Oral Daily  . chlorhexidine  15 mL Mouth Rinse BID  . diltiazem  60 mg Oral Q6H  . feeding supplement (ENSURE ENLIVE)  237 mL Oral BID  . furosemide  40 mg Intravenous Daily  . insulin aspart  0-15 Units Subcutaneous Q4H  . ipratropium-albuterol  3 mL Nebulization Q6H  . mouth rinse  15 mL Mouth Rinse q12n4p  . metoprolol tartrate  25 mg Oral BID  . multivitamin with minerals  1 tablet Oral Daily  . pantoprazole (PROTONIX) IV  40 mg Intravenous Q24H  . polyethylene glycol  17 g Oral Once  . [START ON 09/25/2018] predniSONE  50 mg Oral Q breakfast  . sertraline  100 mg Oral Daily  . sodium chloride flush  3 mL Intravenous Q12H   . sodium chloride    . cefTRIAXone (ROCEPHIN)  IV    . heparin 750 Units/hr (09/24/18 1300)    Allergies:  Allergies  Allergen Reactions  . Hydrocodone Other (See Comments)  . Oxycodone Hives    Social History   Socioeconomic History  . Marital status: Divorced    Spouse name: Not on file  . Number of children: Not on file  . Years of education: Not on file  . Highest education level: Not on file  Occupational History  . Occupation: retired  Scientific laboratory technician  . Financial resource strain: Not hard at all  . Food insecurity:    Worry: Never true    Inability: Never true  . Transportation needs:    Medical: No    Non-medical: No  Tobacco Use  . Smoking status: Former Smoker    Packs/day: 0.25     Years: 30.00    Pack years: 7.50    Types: Cigarettes    Last attempt to quit: 07/01/2017    Years since quitting: 1.2  . Smokeless tobacco: Never Used  Substance and Sexual Activity  . Alcohol use: Yes    Comment: rare, glass of wine occasionally  . Drug use: No  . Sexual activity: Never  Lifestyle  . Physical activity:    Days per week: 5 days    Minutes per session: 30 min  . Stress: Not at all  Relationships  . Social connections:    Talks on phone: Not on file    Gets together: Not on file    Attends religious service: Not on file    Active member of club or organization: Not on file    Attends meetings of clubs or organizations: Not on file    Relationship status: Not on file  . Intimate partner violence:    Fear  of current or ex partner: Not on file    Emotionally abused: Not on file    Physically abused: Not on file    Forced sexual activity: Not on file  Other Topics Concern  . Not on file  Social History Narrative  . Not on file     Family History  Problem Relation Age of Onset  . Stroke Mother   . Cancer Sister   . Parkinsonism Brother      Review of Systems Positive for shortness of breath Negative for: General:  chills, fever, night sweats or weight changes.  Cardiovascular: PND orthopnea syncope dizziness  Dermatological skin lesions rashes Respiratory: Cough congestion Urologic: Frequent urination urination at night and hematuria Abdominal: negative for nausea, vomiting, diarrhea, bright red blood per rectum, melena, or hematemesis Neurologic: negative for visual changes, and/or hearing changes  All other systems reviewed and are otherwise negative except as noted above.  Labs: Recent Labs    09/22/18 1708 09/22/18 2158 09/23/18 0434 09/23/18 1144  TROPONINI 0.77* 0.66* 0.59* 0.65*   Lab Results  Component Value Date   WBC 14.8 (H) 09/24/2018   HGB 11.1 (L) 09/24/2018   HCT 35.4 (L) 09/24/2018   MCV 92.2 09/24/2018   PLT 335  09/24/2018    Recent Labs  Lab 09/22/18 1708  09/24/18 0552  NA 142   < > 146*  K 3.2*   < > 3.7  CL 101   < > 103  CO2 28   < > 29  BUN 59*   < > 73*  CREATININE 1.64*   < > 2.27*  CALCIUM 9.1   < > 9.4  PROT 7.4  --   --   BILITOT 0.8  --   --   ALKPHOS 122  --   --   ALT 16  --   --   AST 27  --   --   GLUCOSE 183*   < > 130*   < > = values in this interval not displayed.   Lab Results  Component Value Date   CHOL 239 (H) 08/17/2017   HDL 54 08/17/2017   LDLCALC 155 (H) 08/17/2017   TRIG 119 09/22/2018   No results found for: DDIMER  Radiology/Studies:  Dg Chest Portable 1 View  Result Date: 09/22/2018 CLINICAL DATA:  85 year old female started on Lasix today for fluid overload. SVT. Cough. EXAM: PORTABLE CHEST 1 VIEW COMPARISON:  Chest radiographs 08/07/2018 and earlier. FINDINGS: Portable AP upright view at 1738 hours. Chronic hiatal hernia. Stable cardiac size and mediastinal contours. Prior CABG. Stable somewhat large lung volumes with no pneumothorax, pulmonary edema, pleural effusion or confluent pulmonary opacity. No acute osseous abnormality identified. IMPRESSION: No acute cardiopulmonary abnormality. Electronically Signed   By: Genevie Ann M.D.   On: 09/22/2018 18:21    EKG: Atrial fibrillation with rapid ventricular rate and right bundle branch block  Weights: Filed Weights   09/22/18 1704 09/22/18 2018 09/24/18 0651  Weight: 43.8 kg 41.7 kg 41.3 kg     Physical Exam: Blood pressure 121/72, pulse 68, temperature 98 F (36.7 C), temperature source Oral, resp. rate 16, height 5\' 1"  (1.549 m), weight 41.3 kg, SpO2 98 %. Body mass index is 17.2 kg/m. General: Well developed, well nourished, in no acute distress. Head eyes ears nose throat: Normocephalic, atraumatic, sclera non-icteric, no xanthomas, nares are without discharge. No apparent thyromegaly and/or mass  Lungs: Normal respiratory effort.  no wheezes, no rales, no rhonchi.  Heart: Irregular  with  normal S1 S2. no murmur gallop, no rub, PMI is normal size and placement, carotid upstroke normal without bruit, jugular venous pressure is normal Abdomen: Soft, non-tender, non-distended with normoactive bowel sounds. No hepatomegaly. No rebound/guarding. No obvious abdominal masses. Abdominal aorta is normal size without bruit Extremities: Trace edema. no cyanosis, no clubbing, no ulcers  Peripheral : 2+ bilateral upper extremity pulses, 2+ bilateral femoral pulses, 2+ bilateral dorsal pedal pulse Neuro: Not   alert and oriented. No facial asymmetry. No focal deficit. Moves all extremities spontaneously. Musculoskeletal: Normal muscle tone without kyphosis Psych: Does not responds to questions appropriately with a normal affect.    Assessment: 85 year old female with acute on chronic diastolic dysfunction congestive heart failure atrial fibrillation with rapid ventricular rate anemia chronic kidney disease and elevated troponin consistent with demand ischemia rather than acute coronary syndrome slightly improved  Plan: 1.  Continue intravenous diuresis for acute on chronic diastolic dysfunction heart failure 2.  Continue beta-blocker for heart rate control of atrial fibrillation as well as Cardizem and maintaining normal rhythm at this time 3.  Anticoagulation with heparin for further risk reduction in stroke with atrial fibrillation and deep venous thrombosis 4.  No further cardiac diagnostics necessary at this time due to some improvements in LV function normal by echocardiogram 5.  No further intervention of demand ischemia elevation of troponin without evidence of acute coronary syndrome 6.  Further treatment of hypoxia and other medical management of chronic kidney disease and anemia and further adjustments as necessary  Signed, Corey Skains M.D. [facility] 09/24/2018, 1:26 PM

## 2018-09-24 NOTE — Plan of Care (Signed)
Pt only alert to self, heparin drip infusing, HR stayed controlled last night, foley in place for urinary retention.   Problem: Coping: Goal: Level of anxiety will decrease Outcome: Progressing   Problem: Pain Managment: Goal: General experience of comfort will improve Outcome: Progressing Note:  No complaints of pain this shift   Problem: Safety: Goal: Ability to remain free from injury will improve Outcome: Progressing

## 2018-09-24 NOTE — Progress Notes (Signed)
ANTICOAGULATION CONSULT NOTE - Initial Consult  Pharmacy Consult for  Heparin  Indication: atrial fibrillation  Allergies  Allergen Reactions  . Hydrocodone Other (See Comments)  . Oxycodone Hives    Patient Measurements: Height: 5\' 1"  (154.9 cm) Weight: 91 lb 14.9 oz (41.7 kg) IBW/kg (Calculated) : 47.8 Heparin Dosing Weight:  43.8 kg   Vital Signs: Temp: 97.6 F (36.4 C) (04/20 0443) Temp Source: Oral (04/20 0443) BP: 141/71 (04/20 0443) Pulse Rate: 74 (04/20 0443)  Labs: Recent Labs    09/22/18 1708 09/22/18 1900 09/22/18 2158 09/23/18 0434 09/23/18 1144 09/24/18 0552  HGB 12.2  --   --  11.7*  --  11.1*  HCT 37.6  --   --  36.4  --  35.4*  PLT 283  --   --  253  --  335  APTT  --  34  --   --   --   --   LABPROT  --  15.0  --   --   --   --   INR  --  1.2  --   --   --   --   HEPARINUNFRC  --   --   --  0.34 0.35 0.26*  CREATININE 1.64*  --   --  1.71*  --  2.27*  TROPONINI 0.77*  --  0.66* 0.59* 0.65*  --     Estimated Creatinine Clearance: 11.7 mL/min (A) (by C-G formula based on SCr of 2.27 mg/dL (H)).   Medical History: Past Medical History:  Diagnosis Date  . Anxiety    unspecified  . Atrial fibrillation ([place])   . COPD (chronic obstructive pulmonary disease) ([place]) 10/05/2013  . Coronary artery disease 10/05/2013  . Dementia ([place])   . Depression   . Diastolic heart failure ([place])   . Hyperlipidemia, unspecified   . Hypertension 10/05/2013  . Hypothyroidism   . Hypothyroidism, acquired    unspecified  . PVD (peripheral vascular disease) ([place]) 10/05/2013   iliac stents    Medications:  Medications Prior to Admission  Medication Sig Dispense Refill Last Dose  . aspirin EC 81 MG tablet Take 81 mg by mouth daily.    09/22/2018 at 0800  . furosemide (LASIX) 40 MG tablet Take 40 mg by mouth daily.   09/22/2018 at 1500  . guaifenesin (ROBITUSSIN) 100 MG/5ML syrup Take 200 mg by mouth every 4 (four) hours as needed for cough.   09/22/2018 at 1013  .  sertraline (ZOLOFT) 100 MG tablet Take 100 mg by mouth daily.    09/22/2018 at 0800  . Vitamin D, Ergocalciferol, (DRISDOL) 1.25 MG (50000 UT) CAPS capsule Take 50,000 Units by mouth every 30 (thirty) days.   09/12/2018 at 2000  . docusate sodium (COLACE) 100 MG capsule Take 1 capsule (100 mg total) by mouth 2 (two) times daily. 10 capsule 0 prn at prn  . feeding supplement, ENSURE ENLIVE, (ENSURE ENLIVE) LIQD Take 237 mLs by mouth 2 (two) times daily. give with a meal for nutrition   Taking  . levothyroxine (SYNTHROID, LEVOTHROID) 75 MCG tablet Take 1 tablet (75 mcg total) by mouth daily before breakfast. (Patient not taking: Reported on 09/22/2018) 30 tablet 0 Not Taking at Unknown time  . nitroGLYCERIN (NITROSTAT) 0.4 MG SL tablet Place 0.4 mg under the tongue every 5 (five) minutes as needed for chest pain.   prn at prn  . Nutritional Supplements (NUTRITIONAL SUPPLEMENT PO) Diet Type: Regular consistency, NAS   Taking  . OXYGEN  Inhale 2 L into the lungs continuous as needed.    Taking  . pantoprazole (PROTONIX) 40 MG tablet Take 1 tablet (40 mg total) by mouth 2 (two) times daily. (Patient not taking: Reported on 09/22/2018) 60 tablet 0 Not Taking at Unknown time  . polyethylene glycol (MIRALAX / GLYCOLAX) packet Take 17 g by mouth daily.   prn at prn    Assessment: Pharmacy consulted to dose heparin in this 85 year old female admitted with AFib.  No prior anticoag noted.  4/19 0434 HL 0.34 4/19 1144 HL 0.35 4/20 0552 HL 0.26  Goal of Therapy:  Heparin level 0.3-0.7 units/ml Monitor platelets by anticoagulation protocol: Yes   Plan:  4/20 @ 0552 HL 0.26. Level is subtherapeutic. Will increase heparin infusion to 750 units/hr. Recheck HL 8 hours after rate change.   Next Heparin level with AM labs.  Continue to monitor anti-Xa  And CBC daily while on heparin.  Pernell Dupre, PharmD, BCPS Clinical Pharmacist 09/24/2018 6:39 AM

## 2018-09-24 NOTE — Progress Notes (Signed)
Initial Nutrition Assessment  DOCUMENTATION CODES:   Underweight  INTERVENTION:   Ensure Enlive po BID, each supplement provides 350 kcal and 20 grams of protein  MVI daily   Liberalize diet   Pt likely at moderate refeed risk  NUTRITION DIAGNOSIS:   Inadequate oral intake related to acute illness as evidenced by other (comment)(per chart review ).  GOAL:   Patient will meet greater than or equal to 90% of their needs  MONITOR:   PO intake, Supplement acceptance, Labs, Weight trends, I & O's, Skin  REASON FOR ASSESSMENT:   Other (Comment)(low BMI)    ASSESSMENT:   85 y.o. female with a history of atrial fibrillation, COPD, advanced dementia, CAD, diastolic heart failure, hypertension, hyperlipidemia, hypothyroidism who presents for evaluation of shortness of breath.   RD working remotely.  Unable to speak with pt r/t dementia. Suspect poor appetite and oral intake at baseline. Per chart, pt with 6lb(6%) wt loss in <1 month; this is significant. RD will liberalize diet and add supplements to help pt meet her estimated needs. There are no documented intakes since admit. Suspect pt at moderate refeed risk.   Pt at high risk for malnutrition but unable to diagnose at this time  Medications reviewed and include: aspirin, lasix, insulin, solumedrol, protonix, miralax, heparin  Labs reviewed: Na 146(H), BUN 73(H), creat 2.27(H) Wbc- 14.8(H) cbgs- 114, 131 x 24 hrs  Unable to complete Nutrition-Focused physical exam at this time.  Diet Order:   Diet Order            Diet Heart Room service appropriate? Yes; Fluid consistency: Thin  Diet effective now             EDUCATION NEEDS:   Not appropriate for education at this time  Skin:  Skin Assessment: Reviewed RN Assessment(ecchymosis )  Last BM:  PTA  Height:   Ht Readings from Last 1 Encounters:  09/22/18 5\' 1"  (1.549 m)    Weight:   Wt Readings from Last 1 Encounters:  09/24/18 41.3 kg    Ideal  Body Weight:  47.7 kg  BMI:  Body mass index is 17.2 kg/m.  Estimated Nutritional Needs:   Kcal:  1100-1300kcal/day   Protein:  62-70g/day   Fluid:  >1L/day   Koleen Distance MS, RD, LDN Pager #- [phone] Office#- [phone] After Hours Pager: [phone]

## 2018-09-24 NOTE — Consult Note (Signed)
Consultation Note Date: 09/24/2018   Patient Name: Holly Clark  DOB: Sep 19, 1929  MRN: [id number]  Age / Sex: 85 y.o., female   PCP: Venia Carbon, MD Referring Physician: Nicholes Mango, MD   REASON FOR CONSULTATION:Establishing goals of care  Palliative Care consult requested for this 85 y.o. female with multiple medical problems including diastolic congestive heart failure, hypertension, atrial fibrillation, hyperlipidemia, hypothyroidism, COPD, dementia, and PVD. She was admitted after presenting with shortness of breath. She was initially placed on BiPAP for respiratory distress and received IV lasix for diuresis and steroids. COVID-19 was ruled out.   Clinical Assessment and Goals of Care: I have reviewed medical records including lab results, imaging, Epic notes, and MAR, received report from the bedside RN, and assessed the patient. I spoke with patient's daughter/poa  Holly Clark to discuss diagnosis prognosis, GOC, EOL wishes, disposition and options.  I introduced Palliative Medicine as specialized medical care for people living with serious illness. It focuses on providing relief from the symptoms and stress of a serious illness. The goal is to improve quality of life for both the patient and the family.  We discussed a brief life review of the patient, along with Ms. Bolender's overal functional and nutritional status. 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 She has 3 children who are all involved in her care. She enjoys spending time with family.   [name] reports prior to admission patient has shown signs of decline. She has been a resident at [facility] skilled for about 2.5 weeks, prior to that she resided at [facility] facility for over a year. Family reports patient began having progressive signs of dementia over the past year which required facility placement. She is bed/chair bound and is total care. Patient can feed herself finger foods but often requires  significant prompting. She is incontinent x2. She has advanced dementia and family reports noticing a decrease in appetite and sleeping more at times. Daughter states she has good days and bad days. She states her mother is not familiar with family, however some days she will try to engage in conversation while others she may not respond much.   We discussed Her current illness and what it means in the larger context of Her on-going co-morbidities. With specific discussions regarding her advancement of dementia, worsening renal function, cardiac and respiratory condition. Natural disease trajectory and expectations at EOL were discussed. Daughter verbalized understanding and appreciation of updates. She expressed her understanding of her mother's decline. She is realistic in her expectations and awareness that she will continue to show signs of decline in regards to her dementia and other co-morbidities.   I attempted to elicit values and goals of care important to the patient.    The difference between aggressive medical intervention and comfort care was considered in light of the patient's goals of care.    [name] confirms patient has an advance directive. She confirms DNR/DNI. She reports patient does not want aggressive interventions or artificial feedings. She wishes to be allowed a natural dying process.   Hospice and Palliative Care services outpatient were explained and offered. Daughter verbalized her understanding and awareness of both palliative and hospice's goals and philosophy of care. She is not prepared to make a decision to transition her care to a comfort approach and hospice until she is able to visit with her and spend some time along with other family members. She is requesting outpatient palliative support with anticipated need to transition  to hospice in the future. She is aware that family may transition to hospice by communicating wishes to outpatient palliative and medical team  once ready.   Questions and concerns were addressed. The family was encouraged to call with questions or concerns.  PMT will continue to support holistically.   SOCIAL HISTORY:     reports that she quit smoking about 14 months ago. Her smoking use included cigarettes. She has a 7.50 pack-year smoking history. She has never used smokeless tobacco. She reports current alcohol use. She reports that she does not use drugs.  CODE STATUS: DNR  ADVANCE DIRECTIVES:  Primary Decision Maker: Holly Clark  HCPOA: YES    SYMPTOM MANAGEMENT: per attending   Palliative Prophylaxis:   Aspiration, Bowel Regimen, Delirium Protocol, Oral Care and Turn Reposition  PSYCHO-SOCIAL/SPIRITUAL:  Support System: Family   Desire for further Chaplaincy support:NO   Additional Recommendations (Limitations, Scope, Preferences):  Full Scope Treatment, No Artificial Feeding, No Hemodialysis and No Tracheostomy   PAST MEDICAL HISTORY: Past Medical History:  Diagnosis Date  . Anxiety    unspecified  . Atrial fibrillation ([place])   . COPD (chronic obstructive pulmonary disease) ([place]) 10/05/2013  . Coronary artery disease 10/05/2013  . Dementia ([place])   . Depression   . Diastolic heart failure ([place])   . Hyperlipidemia, unspecified   . Hypertension 10/05/2013  . Hypothyroidism   . Hypothyroidism, acquired    unspecified  . PVD (peripheral vascular disease) ([place]) 10/05/2013   iliac stents    PAST SURGICAL HISTORY:  Past Surgical History:  Procedure Laterality Date  . ABDOMINAL HYSTERECTOMY    . CARDIAC SURGERY    . CARDIOVERSION    . CATARACT EXTRACTION Bilateral 2012  . CORONARY ARTERY BYPASS GRAFT  1985   x3  . ESOPHAGOGASTRODUODENOSCOPY N/A 07/09/2017   Procedure: ESOPHAGOGASTRODUODENOSCOPY (EGD);  Surgeon: Lin Landsman, MD;  Location[facility] ENDOSCOPY;  Service: Gastroenterology;  Laterality: N/A;  . [place]   patient unsure of which side  . HIP PINNING,CANNULATED  Right 05/19/2015   Procedure: CANNULATED HIP PINNING;  Surgeon: Hessie Knows, MD;  Location: [facility] ORS;  Service: Orthopedics;  Laterality: Right;    ALLERGIES:  is allergic to hydrocodone and oxycodone.   MEDICATIONS:  Current Facility-Administered Medications  Medication Dose Route Frequency Provider Last Rate Last Dose  . 0.9 %  sodium chloride infusion  250 mL Intravenous PRN Pyreddy, Reatha Harps, MD      . acetaminophen (TYLENOL) tablet 650 mg  650 mg Oral Q6H PRN Pyreddy, Reatha Harps, MD       Or  . acetaminophen (TYLENOL) suppository 650 mg  650 mg Rectal Q6H PRN Pyreddy, Reatha Harps, MD      . aspirin EC tablet 81 mg  81 mg Oral Daily Mody, Sital, MD   81 mg at 09/24/18 1055  . cefTRIAXone (ROCEPHIN) 1 g in sodium chloride 0.9 % 100 mL IVPB  1 g Intravenous Q24H Gouru, Aruna, MD      . chlorhexidine (PERIDEX) 0.12 % solution 15 mL  15 mL Mouth Rinse BID Tukov-Yual, Magdalene S, NP   15 mL at 09/24/18 1055  . diltiazem (CARDIZEM) tablet 60 mg  60 mg Oral Q6H Callwood, Dwayne D, MD   60 mg at 09/24/18 0643  . feeding supplement (ENSURE ENLIVE) (ENSURE ENLIVE) liquid 237 mL  237 mL Oral BID Bettey Costa, MD   237 mL at 09/23/18 2213  . furosemide (LASIX) injection 40 mg  40 mg Intravenous Daily Mody,  Sital, MD   40 mg at 09/24/18 1055  . heparin ADULT infusion 100 units/mL (25000 units/293mL sodium chloride 0.45%)  750 Units/hr Intravenous Continuous Hallaji, Sheema M, RPH 7.5 mL/hr at 09/24/18 1300 750 Units/hr at 09/24/18 1300  . insulin aspart (novoLOG) injection 0-15 Units  0-15 Units Subcutaneous Q4H Tukov-Yual, Magdalene S, NP   2 Units at 09/24/18 0805  . ipratropium-albuterol (DUONEB) 0.5-2.5 (3) MG/3ML nebulizer solution 3 mL  3 mL Nebulization Q6H Tukov-Yual, Magdalene S, NP   3 mL at 09/24/18 0821  . MEDLINE mouth rinse  15 mL Mouth Rinse q12n4p Tukov-Yual, Magdalene S, NP      . metoprolol tartrate (LOPRESSOR) tablet 25 mg  25 mg Oral BID Callwood, Dwayne D, MD   25 mg at 09/24/18 1055  .  multivitamin with minerals tablet 1 tablet  1 tablet Oral Daily Gouru, Aruna, MD      . ondansetron (ZOFRAN) tablet 4 mg  4 mg Oral Q6H PRN Pyreddy, Reatha Harps, MD       Or  . ondansetron (ZOFRAN) injection 4 mg  4 mg Intravenous Q6H PRN Pyreddy, Pavan, MD      . pantoprazole (PROTONIX) injection 40 mg  40 mg Intravenous Q24H Tukov-Yual, Magdalene S, NP   40 mg at 09/23/18 2213  . polyethylene glycol (MIRALAX / GLYCOLAX) packet 17 g  17 g Oral Once Gouru, Illene Silver, MD      . Derrill Memo ON 09/25/2018] predniSONE (DELTASONE) tablet 50 mg  50 mg Oral Q breakfast Gouru, Aruna, MD      . senna-docusate (Senokot-S) tablet 1 tablet  1 tablet Oral QHS PRN Pyreddy, Reatha Harps, MD      . sertraline (ZOLOFT) tablet 100 mg  100 mg Oral Daily Mody, Sital, MD   100 mg at 09/24/18 1055  . sodium chloride flush (NS) 0.9 % injection 3 mL  3 mL Intravenous Q12H Pyreddy, Pavan, MD   3 mL at 09/24/18 1100  . sodium chloride flush (NS) 0.9 % injection 3 mL  3 mL Intravenous PRN Pyreddy, Reatha Harps, MD        VITAL SIGNS: BP 121/72 (BP Location: Right Arm)   Pulse 68   Temp 98 F (36.7 C) (Oral)   Resp 16   Ht 5\' 1"  (1.549 m)   Wt 41.3 kg   SpO2 98%   BMI 17.20 kg/m  Filed Weights   09/22/18 1704 09/22/18 2018 09/24/18 0651  Weight: 43.8 kg 41.7 kg 41.3 kg    Estimated body mass index is 17.2 kg/m as calculated from the following:   Height as of this encounter: 5\' 1"  (1.549 m).   Weight as of this encounter: 41.3 kg.  LABS: CBC:    Component Value Date/Time   WBC 14.8 (H) 09/24/2018 0552   HGB 11.1 (L) 09/24/2018 0552   HGB 11.9 (L) 10/01/2013 0421   HCT 35.4 (L) 09/24/2018 0552   HCT 35.5 10/01/2013 0421   PLT 335 09/24/2018 0552   PLT 177 10/01/2013 0421   Comprehensive Metabolic Panel:    Component Value Date/Time   NA 146 (H) 09/24/2018 0552   NA 134 (L) 10/01/2013 0421   K 3.7 09/24/2018 0552   K 4.4 10/01/2013 0421   CO2 29 09/24/2018 0552   CO2 30 10/01/2013 0421   BUN 73 (H) 09/24/2018 0552   BUN  15 10/01/2013 0421   CREATININE 2.27 (H) 09/24/2018 0552   CREATININE 0.80 10/01/2013 0421   ALBUMIN 3.7 09/22/2018 1708  Review of Systems  Unable to perform ROS: Dementia   Unless otherwise noted, a complete review of systems is negative.  Physical Exam Vitals signs and nursing note reviewed.  Constitutional:      General: She is awake.     Comments: Chronically ill appearing  Cardiovascular:     Rate and Rhythm: Normal rate and regular rhythm.     Pulses: Normal pulses.     Heart sounds: Normal heart sounds.  Pulmonary:     Effort: Pulmonary effort is normal.     Breath sounds: Decreased breath sounds present.  Abdominal:     General: Bowel sounds are normal.     Palpations: Abdomen is soft.  Skin:    General: Skin is warm and dry.     Findings: Ecchymosis present.  Neurological:     Mental Status: She is alert. She is confused.     Comments: Dementia at baseline   Psychiatric:        Behavior: Behavior is cooperative.        Cognition and Memory: Cognition is impaired. Memory is impaired.        Judgment: Judgment is inappropriate.    General: NAD, frail appearing, thin Cardiovascular: regular rate and rhythm Pulmonary: clear ant fields Abdomen: soft, nontender, + bowel sounds GU: no suprapubic tenderness Extremities: no edema, no joint deformities Skin: no rashes Neurological: Weakness but otherwise nonfocal   Prognosis: Unable to determine  Discharge Planning:  Per daughter patient will return to [facility] with outpatient palliative support   Recommendations:  DNR/DNI-as confirmed by daughter/poa  Continue to treat  Per daughter goal is for patient to return to [facility] care with outpatient palliative support. Will transition to hospice in the future once COVID-19 is over and can spend time and see her mother along with her other siblings.   CSW referral for outpatient palliative at facility  PMT will continue to support and follow as  needed.    Palliative Performance Scale: PPS 30%              Family expressed understanding and was in agreement with this plan.   Thank you for allowing the Palliative Medicine Team to assist in the care of this patient.  The above conversation was completed via telephone due to the visitor restrictions during the COVID-19 pandemic. Thorough chart review and discussion with necessary members of the care team was completed as part of assessment.    Time In: 1130 Time Out: 1235 Time Total: 65 min.   Visit consisted of counseling and education dealing with the complex and emotionally intense issues of symptom management and palliative care in the setting of serious and potentially life-threatening illness.Greater than 50%  of this time was spent counseling and coordinating care related to the above assessment and plan.  Signed by:  Alda Lea, AGPCNP-BC Palliative Medicine Team  Phone: [phone] Fax: [phone] Pager: [phone] Amion: Bjorn Pippin

## 2018-09-24 NOTE — TOC Initial Note (Signed)
Transition of Care [facility]) - Initial/Assessment Note    Patient Details  Name: AWA BACHICHA MRN: [id number] Date of Birth: 01/11/1930  Transition of Care [facility]) CM/SW Contact:    Ross Ludwig, LCSW Phone Number: 09/24/2018, 4:32 PM  Clinical Narrative:  Patient is an 85 year old female who is alert and oriented x1.  Patient is a long term care resident at [facility].  Patient has some confusion, CSW spoke with patient's daughter to complete assessment.  Patient has been at [facility] since February, she was at [facility] previously, for about a year.  Patient's daughter expressed that she is satisfied with the care that is being provided for patient.  CSW explained role of CSW and process for facilitating discharge back to SNF.  Patient's daughter stated she would like her to return back to the SNF, patient's daughter did not have any more questions or concerns and gave CSW permission to contact SNF to facilitate discharge planning.                Expected Discharge Plan: Long Term Nursing Home Barriers to Discharge: Continued Medical Work up   Patient Goals and CMS Choice Patient states their goals for this hospitalization and ongoing recovery are:: Patient's daughter reports that patient plans to return back to [facility] where she is a long term care resident. CMS Medicare.gov Compare Post Acute Care list provided to:: Patient Represenative (must comment)(Patient has some confusion and assessment completed by speaking to daughter.) Choice offered to / list presented to : Adult Children, HC POA / Guardian  Expected Discharge Plan and Services Expected Discharge Plan: Long Term Nursing Home In-house Referral: Clinical Social Work Discharge Planning Services: NA Post Acute Care Choice: Nursing Home Living arrangements for the past 2 months: [place]                 DME Arranged: N/A DME Agency: NA HH Arranged: NA [place] Agency: NA  Prior Living  Arrangements/Services Living arrangements for the past 2 months: [place] Lives with:: Facility Resident Patient language and need for interpreter reviewed:: Yes Do you feel safe going back to the place where you live?: Yes      Need for Family Participation in Patient Care: Yes (Comment)(Patient has dementia and some confusion.) Care giver support system in place?: Yes (comment) Current home services: Other (comment)(NA) Criminal Activity/Legal Involvement Pertinent to Current Situation/Hospitalization: No - Comment as needed  Activities of Daily Living   ADL Screening (condition at time of admission) Is the patient deaf or have difficulty hearing?: No Does the patient have difficulty seeing, even when wearing glasses/contacts?: No Does the patient have difficulty concentrating, remembering, or making decisions?: Yes Does the patient have difficulty dressing or bathing?: Yes Does the patient have difficulty walking or climbing stairs?: Yes  Permission Sought/Granted Permission sought to share information with : Family Supports, Chartered certified accountant granted to share information with : Yes, Verbal Permission Granted  Share Information with NAME: Patient's daughter  Permission granted to share info w AGENCY: [facility]     Permission granted to share info w Contact Information:          Emotional Assessment Appearance:: Appears stated age Attitude/Demeanor/Rapport: Lethargic Affect (typically observed): Accepting, Stable, Calm Orientation: : Oriented to Self Alcohol / Substance Use: Not Applicable Psych Involvement: No (comment)  Admission diagnosis:  Acute respiratory failure with hypoxia (HCC) [J96.01] Atrial fibrillation with RVR ([place]) [I48.91] Demand ischemia  of myocardium [facility]) [I24.8] Patient Active Problem List   Diagnosis Date Noted  . Acute respiratory failure ([place]) 09/22/2018  . Demand ischemia of myocardium ([place])   . HCAP  (healthcare-associated pneumonia) 07/17/2018  . Hypokalemia 07/17/2018  . Coronary artery disease involving coronary bypass graft of native heart 01/14/2018  . Hypothyroidism due to acquired atrophy of thyroid 12/11/2017  . Chronic constipation 12/11/2017  . Depression with anxiety 12/11/2017  . Vascular dementia without behavioral disturbance ([place]) 12/11/2017  . Chronic right hip pain 12/11/2017  . Failure to thrive in adult 12/11/2017  . Poor appetite 09/06/2017  . Chronic diastolic heart failure ([place]) 08/01/2017  . HTN (hypertension) 08/01/2017  . Atrial fibrillation with RVR ([place]) 08/01/2017  . COPD (chronic obstructive pulmonary disease) ([place]) 08/01/2017  . Esophagitis, erosive   . Iron deficiency anemia due to chronic blood loss   . [place] lesion, chronic   . Large hiatal hernia   . Hip fracture, right ([place]) 05/18/2015   PCP:  Venia Carbon, MD Pharmacy:   [place], [place] [address] [place] [address] Phone: [phone] Fax: [phone] [place] [address] Phone: [phone] Fax: [phone]     Social Determinants of Health (SDOH) Interventions    Readmission Risk Interventions Readmission Risk Prevention Plan 09/24/2018  Transportation Screening Complete  PCP or Specialist Appt within 3-5 Days Complete  Social Work Consult for [place] Agency Planning/Counseling Complete  Palliative Care Screening Complete  Medication Review Press photographer) Complete  Some recent data might be hidden

## 2018-09-24 NOTE — Progress Notes (Signed)
ANTICOAGULATION CONSULT NOTE - Initial Consult  Pharmacy Consult for  Heparin  Indication: atrial fibrillation  Allergies  Allergen Reactions  . Hydrocodone Other (See Comments)  . Oxycodone Hives    Patient Measurements: Height: 5\' 1"  (154.9 cm) Weight: 91 lb 0.8 oz (41.3 kg) IBW/kg (Calculated) : 47.8 Heparin Dosing Weight:  43.8 kg   Vital Signs: Temp: 98 F (36.7 C) (04/20 0755) Temp Source: Oral (04/20 0755) BP: 121/72 (04/20 0755) Pulse Rate: 68 (04/20 0755)  Labs: Recent Labs    09/22/18 1708 09/22/18 1900 09/22/18 2158  09/23/18 0434 09/23/18 1144 09/24/18 0552 09/24/18 1501  HGB 12.2  --   --   --  11.7*  --  11.1*  --   HCT 37.6  --   --   --  36.4  --  35.4*  --   PLT 283  --   --   --  253  --  335  --   APTT  --  34  --   --   --   --   --   --   LABPROT  --  15.0  --   --   --   --   --   --   INR  --  1.2  --   --   --   --   --   --   HEPARINUNFRC  --   --   --    < > 0.34 0.35 0.26* 0.54  CREATININE 1.64*  --   --   --  1.71*  --  2.27*  --   TROPONINI 0.77*  --  0.66*  --  0.59* 0.65*  --   --    < > = values in this interval not displayed.    Estimated Creatinine Clearance: 11.6 mL/min (A) (by C-G formula based on SCr of 2.27 mg/dL (H)).   Medical History: Past Medical History:  Diagnosis Date  . Anxiety    unspecified  . Atrial fibrillation ([place])   . COPD (chronic obstructive pulmonary disease) ([place]) 10/05/2013  . Coronary artery disease 10/05/2013  . Dementia ([place])   . Depression   . Diastolic heart failure ([place])   . Hyperlipidemia, unspecified   . Hypertension 10/05/2013  . Hypothyroidism   . Hypothyroidism, acquired    unspecified  . PVD (peripheral vascular disease) ([place]) 10/05/2013   iliac stents    Medications:  Medications Prior to Admission  Medication Sig Dispense Refill Last Dose  . aspirin EC 81 MG tablet Take 81 mg by mouth daily.    09/22/2018 at 0800  . furosemide (LASIX) 40 MG tablet Take 40 mg by mouth daily.    09/22/2018 at 1500  . guaifenesin (ROBITUSSIN) 100 MG/5ML syrup Take 200 mg by mouth every 4 (four) hours as needed for cough.   09/22/2018 at 1013  . sertraline (ZOLOFT) 100 MG tablet Take 100 mg by mouth daily.    09/22/2018 at 0800  . Vitamin D, Ergocalciferol, (DRISDOL) 1.25 MG (50000 UT) CAPS capsule Take 50,000 Units by mouth every 30 (thirty) days.   09/12/2018 at 2000  . docusate sodium (COLACE) 100 MG capsule Take 1 capsule (100 mg total) by mouth 2 (two) times daily. 10 capsule 0 prn at prn  . feeding supplement, ENSURE ENLIVE, (ENSURE ENLIVE) LIQD Take 237 mLs by mouth 2 (two) times daily. give with a meal for nutrition   Taking  . levothyroxine (SYNTHROID, LEVOTHROID) 75 MCG tablet Take 1 tablet (75  mcg total) by mouth daily before breakfast. (Patient not taking: Reported on 09/22/2018) 30 tablet 0 Not Taking at Unknown time  . nitroGLYCERIN (NITROSTAT) 0.4 MG SL tablet Place 0.4 mg under the tongue every 5 (five) minutes as needed for chest pain.   prn at prn  . Nutritional Supplements (NUTRITIONAL SUPPLEMENT PO) Diet Type: Regular consistency, NAS   Taking  . OXYGEN Inhale 2 L into the lungs continuous as needed.    Taking  . pantoprazole (PROTONIX) 40 MG tablet Take 1 tablet (40 mg total) by mouth 2 (two) times daily. (Patient not taking: Reported on 09/22/2018) 60 tablet 0 Not Taking at Unknown time  . polyethylene glycol (MIRALAX / GLYCOLAX) packet Take 17 g by mouth daily.   prn at prn    Assessment: Pharmacy consulted to dose heparin in this 85 year old female admitted with AFib.  No prior anticoag noted.  4/19 0434 HL 0.34 4/19 1144 HL 0.35 4/20 0552 HL 0.26 4/20 1501 HL 0.54  Goal of Therapy:  Heparin level 0.3-0.7 units/ml Monitor platelets by anticoagulation protocol: Yes   Plan:  4/20 @ 1501 HL 0.54.   Level is therapeutic x 1.   Will recheck HL in 8 hours per protocol   Next Heparin level with AM labs.  Will continue to monitor CBC daily while on  heparin.  Lu Duffel, PharmD, BCPS Clinical Pharmacist 09/24/2018 4:01 PM

## 2018-09-24 NOTE — Clinical Social Work Note (Signed)
CSW spoke with patient's daughter Baron Sane [phone], she said patient is from [facility], not memory care as mentioned in the notes.  Jones Broom. [place], MSW, [place]  09/24/2018 4:15 PM

## 2018-09-24 NOTE — Progress Notes (Signed)
ANTICOAGULATION CONSULT NOTE - Initial Consult  Pharmacy Consult for  Heparin  Indication: atrial fibrillation  Allergies  Allergen Reactions  . Hydrocodone Other (See Comments)  . Oxycodone Hives    Patient Measurements: Height: 5\' 1"  (154.9 cm) Weight: 91 lb 0.8 oz (41.3 kg) IBW/kg (Calculated) : 47.8 Heparin Dosing Weight:  43.8 kg   Vital Signs: Temp: 97.9 F (36.6 C) (04/20 2001) Temp Source: Oral (04/20 2001) BP: 122/86 (04/20 2001) Pulse Rate: 86 (04/20 2001)  Labs: Recent Labs    09/22/18 1708 09/22/18 1900 09/22/18 2158  09/23/18 0434 09/23/18 1144 09/24/18 0552 09/24/18 1501 09/24/18 2305  HGB 12.2  --   --   --  11.7*  --  11.1*  --   --   HCT 37.6  --   --   --  36.4  --  35.4*  --   --   PLT 283  --   --   --  253  --  335  --   --   APTT  --  34  --   --   --   --   --   --   --   LABPROT  --  15.0  --   --   --   --   --   --   --   INR  --  1.2  --   --   --   --   --   --   --   HEPARINUNFRC  --   --   --    < > 0.34 0.35 0.26* 0.54 0.47  CREATININE 1.64*  --   --   --  1.71*  --  2.27*  --   --   TROPONINI 0.77*  --  0.66*  --  0.59* 0.65*  --   --   --    < > = values in this interval not displayed.    Estimated Creatinine Clearance: 11.6 mL/min (A) (by C-G formula based on SCr of 2.27 mg/dL (H)).   Medical History: Past Medical History:  Diagnosis Date  . Anxiety    unspecified  . Atrial fibrillation ([place])   . COPD (chronic obstructive pulmonary disease) ([place]) 10/05/2013  . Coronary artery disease 10/05/2013  . Dementia ([place])   . Depression   . Diastolic heart failure ([place])   . Hyperlipidemia, unspecified   . Hypertension 10/05/2013  . Hypothyroidism   . Hypothyroidism, acquired    unspecified  . PVD (peripheral vascular disease) ([place]) 10/05/2013   iliac stents    Medications:  Medications Prior to Admission  Medication Sig Dispense Refill Last Dose  . aspirin EC 81 MG tablet Take 81 mg by mouth daily.    09/22/2018 at 0800   . furosemide (LASIX) 40 MG tablet Take 40 mg by mouth daily.   09/22/2018 at 1500  . guaifenesin (ROBITUSSIN) 100 MG/5ML syrup Take 200 mg by mouth every 4 (four) hours as needed for cough.   09/22/2018 at 1013  . sertraline (ZOLOFT) 100 MG tablet Take 100 mg by mouth daily.    09/22/2018 at 0800  . Vitamin D, Ergocalciferol, (DRISDOL) 1.25 MG (50000 UT) CAPS capsule Take 50,000 Units by mouth every 30 (thirty) days.   09/12/2018 at 2000  . docusate sodium (COLACE) 100 MG capsule Take 1 capsule (100 mg total) by mouth 2 (two) times daily. 10 capsule 0 prn at prn  . feeding supplement, ENSURE ENLIVE, (ENSURE ENLIVE) LIQD Take 237 mLs  by mouth 2 (two) times daily. give with a meal for nutrition   Taking  . levothyroxine (SYNTHROID, LEVOTHROID) 75 MCG tablet Take 1 tablet (75 mcg total) by mouth daily before breakfast. (Patient not taking: Reported on 09/22/2018) 30 tablet 0 Not Taking at Unknown time  . nitroGLYCERIN (NITROSTAT) 0.4 MG SL tablet Place 0.4 mg under the tongue every 5 (five) minutes as needed for chest pain.   prn at prn  . Nutritional Supplements (NUTRITIONAL SUPPLEMENT PO) Diet Type: Regular consistency, NAS   Taking  . OXYGEN Inhale 2 L into the lungs continuous as needed.    Taking  . pantoprazole (PROTONIX) 40 MG tablet Take 1 tablet (40 mg total) by mouth 2 (two) times daily. (Patient not taking: Reported on 09/22/2018) 60 tablet 0 Not Taking at Unknown time  . polyethylene glycol (MIRALAX / GLYCOLAX) packet Take 17 g by mouth daily.   prn at prn    Assessment: Pharmacy consulted to dose heparin in this 85 year old female admitted with AFib.  No prior anticoag noted.  4/19 0434 HL 0.34 4/19 1144 HL 0.35 4/20 0552 HL 0.26 4/20 1501 HL 0.54  Goal of Therapy:  Heparin level 0.3-0.7 units/ml Monitor platelets by anticoagulation protocol: Yes   Plan:  04/20 @ 2330 HL 0.47 therapeutic. Will continue current rate and will recheck HL/CBC w/ am labs  Tobie Lords, PharmD,  BCPS Clinical Pharmacist 09/24/2018 11:48 PM

## 2018-09-25 LAB — GLUCOSE, CAPILLARY
Glucose-Capillary: 144 mg/dL — ABNORMAL HIGH (ref 70–99)
Glucose-Capillary: 253 mg/dL — ABNORMAL HIGH (ref 70–99)
Glucose-Capillary: 314 mg/dL — ABNORMAL HIGH (ref 70–99)
Glucose-Capillary: 84 mg/dL (ref 70–99)
Glucose-Capillary: 91 mg/dL (ref 70–99)
Glucose-Capillary: 95 mg/dL (ref 70–99)

## 2018-09-25 LAB — URINE CULTURE: Culture: 50000 — AB

## 2018-09-25 LAB — CBC
HCT: 35.4 % — ABNORMAL LOW (ref 36.0–46.0)
Hemoglobin: 11.1 g/dL — ABNORMAL LOW (ref 12.0–15.0)
MCH: 29.2 pg (ref 26.0–34.0)
MCHC: 31.4 g/dL (ref 30.0–36.0)
MCV: 93.2 fL (ref 80.0–100.0)
Platelets: 356 10*3/uL (ref 150–400)
RBC: 3.8 MIL/uL — ABNORMAL LOW (ref 3.87–5.11)
RDW: 14.2 % (ref 11.5–15.5)
WBC: 15.3 10*3/uL — ABNORMAL HIGH (ref 4.0–10.5)
nRBC: 0 % (ref 0.0–0.2)

## 2018-09-25 LAB — T3, FREE: T3, Free: 1.3 pg/mL — ABNORMAL LOW (ref 2.0–4.4)

## 2018-09-25 LAB — BASIC METABOLIC PANEL
Anion gap: 14 (ref 5–15)
BUN: 82 mg/dL — ABNORMAL HIGH (ref 8–23)
CO2: 29 mmol/L (ref 22–32)
Calcium: 8.9 mg/dL (ref 8.9–10.3)
Chloride: 100 mmol/L (ref 98–111)
Creatinine, Ser: 2.26 mg/dL — ABNORMAL HIGH (ref 0.44–1.00)
GFR calc Af Amer: 22 mL/min — ABNORMAL LOW (ref >60)
GFR calc non Af Amer: 19 mL/min — ABNORMAL LOW (ref >60)
Glucose, Bld: 97 mg/dL (ref 70–99)
Potassium: 3.6 mmol/L (ref 3.5–5.1)
Sodium: 143 mmol/L (ref 135–145)

## 2018-09-25 LAB — HEPARIN LEVEL (UNFRACTIONATED): Heparin Unfractionated: 0.55 IU/mL (ref 0.30–0.70)

## 2018-09-25 MED ORDER — METOPROLOL TARTRATE 50 MG PO TABS
50.0000 mg | ORAL_TABLET | Freq: Two times a day (BID) | ORAL | Status: DC
  Administered 2018-09-25 – 2018-09-26 (×2): 50 mg via ORAL
  Filled 2018-09-25 (×2): qty 1

## 2018-09-25 MED ORDER — APIXABAN 2.5 MG PO TABS
2.5000 mg | ORAL_TABLET | Freq: Two times a day (BID) | ORAL | Status: DC
  Administered 2018-09-25 – 2018-09-26 (×3): 2.5 mg via ORAL
  Filled 2018-09-25 (×3): qty 1

## 2018-09-25 MED ORDER — SODIUM CHLORIDE 0.9 % IV SOLN
500.0000 mg | Freq: Four times a day (QID) | INTRAVENOUS | Status: DC
  Administered 2018-09-25 – 2018-09-26 (×4): 500 mg via INTRAVENOUS
  Filled 2018-09-25 (×12): qty 2

## 2018-09-25 MED ORDER — DILTIAZEM HCL ER COATED BEADS 240 MG PO CP24
240.0000 mg | ORAL_CAPSULE | Freq: Every day | ORAL | Status: DC
  Administered 2018-09-25 – 2018-09-26 (×2): 240 mg via ORAL
  Filled 2018-09-25: qty 1
  Filled 2018-09-25: qty 2
  Filled 2018-09-25: qty 1
  Filled 2018-09-25: qty 2

## 2018-09-25 NOTE — NC FL2 (Signed)
[place] LEVEL OF CARE SCREENING TOOL     IDENTIFICATION  Patient Name: Holly Clark Birthdate: 1929-07-21 Sex: female Admission Date (Current Location): 09/22/2018  [place] and [place] Number:  Holly Clark [id number] [place] and Address:  [facility], [address] [place],  [address]      Provider Number: [id number]  Attending Physician Name and Address:  Nicholes Mango, MD  Relative Name and Phone Number:  Drema Balzarine Daughter [phone]  [phone]     Current Level of Care: Hospital Recommended Level of Care: [place] Prior Approval Number:    Date Approved/Denied:   PASRR Number: [id number]  Discharge Plan: SNF    Current Diagnoses: Patient Active Problem List   Diagnosis Date Noted  . Acute respiratory failure ([place]) 09/22/2018  . Demand ischemia of myocardium ([place])   . HCAP (healthcare-associated pneumonia) 07/17/2018  . Hypokalemia 07/17/2018  . Coronary artery disease involving coronary bypass graft of native heart 01/14/2018  . Hypothyroidism due to acquired atrophy of thyroid 12/11/2017  . Chronic constipation 12/11/2017  . Depression with anxiety 12/11/2017  . Vascular dementia without behavioral disturbance ([place]) 12/11/2017  . Chronic right hip pain 12/11/2017  . Failure to thrive in adult 12/11/2017  . Poor appetite 09/06/2017  . Chronic diastolic heart failure ([place]) 08/01/2017  . HTN (hypertension) 08/01/2017  . Atrial fibrillation with RVR ([place]) 08/01/2017  . COPD (chronic obstructive pulmonary disease) ([place]) 08/01/2017  . Esophagitis, erosive   . Iron deficiency anemia due to chronic blood loss   . [place] lesion, chronic   . Large hiatal hernia   . Hip fracture, right ([place]) 05/18/2015    Orientation RESPIRATION BLADDER Height & Weight     Self    Incontinent Weight: 94 lb 9.2 oz (42.9 kg) Height:  5\' 1"  (154.9 cm)  BEHAVIORAL SYMPTOMS/MOOD NEUROLOGICAL BOWEL  NUTRITION STATUS      Incontinent Diet(Cardiac)  AMBULATORY STATUS COMMUNICATION OF NEEDS Skin   Extensive Assist Verbally Normal                       Personal Care Assistance Level of Assistance  Bathing, Dressing, Feeding Bathing Assistance: Limited assistance Feeding assistance: Limited assistance Dressing Assistance: Limited assistance     Functional Limitations Info  Hearing, Speech, Sight Sight Info: Adequate Hearing Info: Adequate Speech Info: Adequate    SPECIAL CARE FACTORS FREQUENCY  PT (By licensed PT)     PT Frequency: minimum 5x a week              Contractures      Additional Factors Info  Code Status, Allergies, Psychotropic, Insulin Sliding Scale Code Status Info: DNR Allergies Info: HYDROCODONE, OXYCODONE  Psychotropic Info: sertraline (ZOLOFT) tablet 100 mg  Insulin Sliding Scale Info: insulin aspart (novoLOG) injection 0-15 Units every 4 hours       Current Medications (09/25/2018):  This is the current hospital active medication list Current Facility-Administered Medications  Medication Dose Route Frequency Provider Last Rate Last Dose  . 0.9 %  sodium chloride infusion  250 mL Intravenous PRN Pyreddy, Reatha Harps, MD      . acetaminophen (TYLENOL) tablet 650 mg  650 mg Oral Q6H PRN Pyreddy, Reatha Harps, MD       Or  . acetaminophen (TYLENOL) suppository 650 mg  650 mg Rectal Q6H PRN Pyreddy, Pavan, MD      . ampicillin (OMNIPEN) 500 mg in sodium chloride 0.9 % 50 mL IVPB  500 mg Intravenous  Adelina Mings, MD   Stopped at 09/25/18 [phone]  . aspirin EC tablet 81 mg  81 mg Oral Daily Bettey Costa, MD   81 mg at 09/25/18 0957  . chlorhexidine (PERIDEX) 0.12 % solution 15 mL  15 mL Mouth Rinse BID Tukov-Yual, Magdalene S, NP   15 mL at 09/25/18 0957  . diltiazem (CARDIZEM) tablet 60 mg  60 mg Oral Q6H Callwood, Dwayne D, MD   60 mg at 09/25/18 0612  . feeding supplement (ENSURE ENLIVE) (ENSURE ENLIVE) liquid 237 mL  237 mL Oral BID Bettey Costa, MD   237 mL  at 09/25/18 0956  . furosemide (LASIX) injection 40 mg  40 mg Intravenous Daily Mody, Sital, MD   40 mg at 09/25/18 0957  . heparin ADULT infusion 100 units/mL (25000 units/250mL sodium chloride 0.45%)  750 Units/hr Intravenous Continuous Hallaji, Sheema M, RPH 7.5 mL/hr at 09/25/18 0626 750 Units/hr at 09/25/18 0626  . insulin aspart (novoLOG) injection 0-15 Units  0-15 Units Subcutaneous Q4H Tukov-Yual, Magdalene S, NP   3 Units at 09/25/18 0018  . ipratropium-albuterol (DUONEB) 0.5-2.5 (3) MG/3ML nebulizer solution 3 mL  3 mL Nebulization Q6H Tukov-Yual, Magdalene S, NP   3 mL at 09/25/18 0716  . MEDLINE mouth rinse  15 mL Mouth Rinse q12n4p Tukov-Yual, Magdalene S, NP      . metoprolol tartrate (LOPRESSOR) tablet 25 mg  25 mg Oral BID Callwood, Dwayne D, MD   25 mg at 09/25/18 0957  . multivitamin with minerals tablet 1 tablet  1 tablet Oral Daily Gouru, Aruna, MD   1 tablet at 09/25/18 0957  . ondansetron (ZOFRAN) tablet 4 mg  4 mg Oral Q6H PRN Pyreddy, Reatha Harps, MD       Or  . ondansetron (ZOFRAN) injection 4 mg  4 mg Intravenous Q6H PRN Pyreddy, Pavan, MD      . pantoprazole (PROTONIX) injection 40 mg  40 mg Intravenous Q24H Tukov-Yual, Magdalene S, NP   40 mg at 09/24/18 2209  . predniSONE (DELTASONE) tablet 50 mg  50 mg Oral Q breakfast Gouru, Aruna, MD   50 mg at 09/25/18 0806  . senna-docusate (Senokot-S) tablet 1 tablet  1 tablet Oral QHS PRN Saundra Shelling, MD      . sertraline (ZOLOFT) tablet 100 mg  100 mg Oral Daily Benjie Karvonen, Sital, MD   100 mg at 09/25/18 0957  . sodium chloride flush (NS) 0.9 % injection 3 mL  3 mL Intravenous Q12H Pyreddy, Pavan, MD   3 mL at 09/24/18 1100  . sodium chloride flush (NS) 0.9 % injection 3 mL  3 mL Intravenous PRN Saundra Shelling, MD         Discharge Medications: Please see discharge summary for a list of discharge medications.  Relevant Imaging Results:  Relevant Lab Results:   Additional Information SSN [id number]  Ross Ludwig,  LCSW

## 2018-09-25 NOTE — Consult Note (Signed)
[place] for apixaban Indication: atrial fibrillation  Allergies  Allergen Reactions  . Hydrocodone Other (See Comments)  . Oxycodone Hives    Patient Measurements: Height: 5\' 1"  (154.9 cm) Weight: 94 lb 9.2 oz (42.9 kg) IBW/kg (Calculated) : 47.8  Vital Signs: Temp: 97.8 F (36.6 C) (04/21 0753) Temp Source: Oral (04/21 0425) BP: 140/73 (04/21 0753) Pulse Rate: 121 (04/21 0753)  Labs: Recent Labs    09/22/18 1900 09/22/18 2158  09/23/18 0434 09/23/18 1144 09/24/18 0552 09/24/18 1501 09/24/18 2305 09/25/18 0405  HGB  --   --   --  11.7*  --  11.1*  --   --  11.1*  HCT  --   --   --  36.4  --  35.4*  --   --  35.4*  PLT  --   --   --  253  --  335  --   --  356  APTT 34  --   --   --   --   --   --   --   --   LABPROT 15.0  --   --   --   --   --   --   --   --   INR 1.2  --   --   --   --   --   --   --   --   HEPARINUNFRC  --   --    < > 0.34 0.35 0.26* 0.54 0.47 0.55  CREATININE  --   --   --  1.71*  --  2.27*  --   --  2.26*  TROPONINI  --  0.66*  --  0.59* 0.65*  --   --   --   --    < > = values in this interval not displayed.    Estimated Creatinine Clearance: 12.1 mL/min (A) (by C-G formula based on SCr of 2.26 mg/dL (H)).   Medical History: Past Medical History:  Diagnosis Date  . Anxiety    unspecified  . Atrial fibrillation ([place])   . COPD (chronic obstructive pulmonary disease) ([place]) 10/05/2013  . Coronary artery disease 10/05/2013  . Dementia ([place])   . Depression   . Diastolic heart failure ([place])   . Hyperlipidemia, unspecified   . Hypertension 10/05/2013  . Hypothyroidism   . Hypothyroidism, acquired    unspecified  . PVD (peripheral vascular disease) ([place]) 10/05/2013   iliac stents    Medications:  Medications Prior to Admission  Medication Sig Dispense Refill Last Dose  . aspirin EC 81 MG tablet Take 81 mg by mouth daily.    09/22/2018 at 0800  . furosemide (LASIX) 40 MG tablet Take 40 mg by  mouth daily.   09/22/2018 at 1500  . guaifenesin (ROBITUSSIN) 100 MG/5ML syrup Take 200 mg by mouth every 4 (four) hours as needed for cough.   09/22/2018 at 1013  . sertraline (ZOLOFT) 100 MG tablet Take 100 mg by mouth daily.    09/22/2018 at 0800  . Vitamin D, Ergocalciferol, (DRISDOL) 1.25 MG (50000 UT) CAPS capsule Take 50,000 Units by mouth every 30 (thirty) days.   09/12/2018 at 2000  . docusate sodium (COLACE) 100 MG capsule Take 1 capsule (100 mg total) by mouth 2 (two) times daily. 10 capsule 0 prn at prn  . feeding supplement, ENSURE ENLIVE, (ENSURE ENLIVE) LIQD Take 237 mLs by mouth 2 (two) times daily. give with a meal for  nutrition   Taking  . levothyroxine (SYNTHROID, LEVOTHROID) 75 MCG tablet Take 1 tablet (75 mcg total) by mouth daily before breakfast. (Patient not taking: Reported on 09/22/2018) 30 tablet 0 Not Taking at Unknown time  . nitroGLYCERIN (NITROSTAT) 0.4 MG SL tablet Place 0.4 mg under the tongue every 5 (five) minutes as needed for chest pain.   prn at prn  . Nutritional Supplements (NUTRITIONAL SUPPLEMENT PO) Diet Type: Regular consistency, NAS   Taking  . OXYGEN Inhale 2 L into the lungs continuous as needed.    Taking  . pantoprazole (PROTONIX) 40 MG tablet Take 1 tablet (40 mg total) by mouth 2 (two) times daily. (Patient not taking: Reported on 09/22/2018) 60 tablet 0 Not Taking at Unknown time  . polyethylene glycol (MIRALAX / GLYCOLAX) packet Take 17 g by mouth daily.   prn at prn   Scheduled:  . apixaban  2.5 mg Oral BID  . aspirin EC  81 mg Oral Daily  . chlorhexidine  15 mL Mouth Rinse BID  . diltiazem  240 mg Oral Daily  . feeding supplement (ENSURE ENLIVE)  237 mL Oral BID  . furosemide  40 mg Intravenous Daily  . insulin aspart  0-15 Units Subcutaneous Q4H  . ipratropium-albuterol  3 mL Nebulization Q6H  . mouth rinse  15 mL Mouth Rinse q12n4p  . metoprolol tartrate  50 mg Oral BID  . multivitamin with minerals  1 tablet Oral Daily  . pantoprazole  (PROTONIX) IV  40 mg Intravenous Q24H  . predniSONE  50 mg Oral Q breakfast  . sertraline  100 mg Oral Daily  . sodium chloride flush  3 mL Intravenous Q12H   Infusions:  . sodium chloride    . ampicillin (OMNIPEN) IV Stopped (09/25/18 0948)   PRN: sodium chloride, acetaminophen **OR** acetaminophen, ondansetron **OR** ondansetron (ZOFRAN) IV, senna-docusate, sodium chloride flush  Assessment: Pharmacy was consulted on starting apixaban. Pt was on heparin, which is d/c'ed. Pt > 80 yr, Scr > 1.5, and Wt < 60 kg.   Goal of Therapy:  Monitor platelets by anticoagulation protocol: Yes   Plan:  Will start apixaban 2.5 mg BID- pt qualifies for reduced dosing.   Oswald Hillock, PharmD, BCPS 09/25/2018,12:58 PM

## 2018-09-25 NOTE — Progress Notes (Signed)
[facility] [place] NAME: Holly Clark    MR#:  [id number]  DATE OF BIRTH:  05-Dec-1929  SUBJECTIVE:  Patient has baseline dementia is resting comfortably, pulling off her gown Heart rate is fluctuating between 95-1 20  REVIEW OF SYSTEMS:    Unable to obtain due to dementia   Tolerating Diet:npo     DRUG ALLERGIES:   Allergies  Allergen Reactions  . Hydrocodone Other (See Comments)  . Oxycodone Hives    VITALS:  Blood pressure 140/73, pulse (!) 121, temperature 97.8 F (36.6 C), resp. rate 18, height 5\' 1"  (1.549 m), weight 42.9 kg, SpO2 99 %.  PHYSICAL EXAMINATION:  Constitutional: Appears frail appearing on BiPAP agitated. HENT: Normocephalic. Marland Kitchen Oropharynx is clear and moist.  Eyes: Conjunctivae and EOM are normal. PERRLA, no scleral icterus.  Neck: Normal ROM. Neck supple. No JVD. No tracheal deviation. CVS: RRR, S1/S2 +, no murmurs, no gallops, no carotid bruit.  Pulmonary:bilateral rhonchi  abdominal: Soft. BS +,  no distension, tenderness, rebound or guarding.  Musculoskeletal: Normal range of motion. N edema and no tenderness.  Neuro: Alert.  Moves all extremities without obvious focal deficit. Skin: Skin is warm and dry. No rash noted. Psychiatric: Agitated with dementia    LABORATORY PANEL:   CBC Recent Labs  Lab 09/25/18 0405  WBC 15.3*  HGB 11.1*  HCT 35.4*  PLT 356   ------------------------------------------------------------------------------------------------------------------  Chemistries  Recent Labs  Lab 09/22/18 1708 09/23/18 0434  09/25/18 0405  NA 142 142   < > 143  K 3.2* 3.3*   < > 3.6  CL 101 102   < > 100  CO2 28 28   < > 29  GLUCOSE 183* 271*   < > 97  BUN 59* 57*   < > 82*  CREATININE 1.64* 1.71*   < > 2.26*  CALCIUM 9.1 8.8*   < > 8.9  MG 2.5* 2.8*  --   --   AST 27  --   --   --   ALT 16  --   --   --   ALKPHOS 122  --   --   --   BILITOT 0.8  --   --   --    < > =  values in this interval not displayed.   ------------------------------------------------------------------------------------------------------------------  Cardiac Enzymes Recent Labs  Lab 09/22/18 2158 09/23/18 0434 09/23/18 1144  TROPONINI 0.66* 0.59* 0.65*   ------------------------------------------------------------------------------------------------------------------  RADIOLOGY:  No results found.   ASSESSMENT AND PLAN:    85 year old female with history of dementia, atrial fibrillation and chronic diastolic heart failure who presents from skilled nursing facility due to shortness of breath and hypoxia.  1. Atrial fibrillation with RVR: Continue monitoring heart rate around 100-1 20 cardiology consultation placed, appreciate cardiology recommendations  Cardizem 60 mg p.o. every 6 hours changed to 240 mg extended release per day  Metoprolol dose increased from 25 to 50 mg we will continue close monitoring and titrate as needed will change heparin drip to p.o. Eliquis  2.  Acute on chronic diastolic heart failure with AKI Continue Lasix dose reduced to 40 mg from 60 as  renal function is getting worse Monitor intake and output Nephrotoxins to be avoided BMP in a.m.  3.  Acute on chronic hypoxic respiratory failure in the setting of acute CHF exacerbation: Wean to 2 L as tolerated Patient ruled out for COVID-19. Solu-Medrol changed to p.o. prednisone Clinically doing okay  4.  History of hypothyroidism not taking medications at this time with decreased TSH on labs: Check T3 and T4  5.  Elevated troponin: Likely in the setting of demand ischemia from above issues Trending down  6.  Dementia: Watch for ICU delirium Consider adding Risperdal for agitation as needed  7.  Protein calorie malnutrition on feeding supplement  8.  Hypokalemia: Replace and recheck 3.7  9.  Urinary tract infection with Enterococcus faecalis Started Rocephin .  Changed to IV ampicillin  based on the sensitivities and will change it to levofloxacin at the time of discharge  Patient would benefit from palliative care consultation.   CODE DNR  TOTAL TIME TAKING CARE OF THIS PATIENT: 33 minutes.     POSSIBLE D/C 2-3 days, DEPENDING ON CLINICAL CONDITION.   Nicholes Mango M.D on 09/25/2018 at 12:56 PM  Between 7am to 6pm - Pager - [phone]  After 6pm go to [email or web address] - password [email or web address] [facility]  Office  [phone]  CC: Primary care physician; Venia Carbon, MD  Note: This dictation was prepared with Dragon dictation along with smaller phrase technology. Any transcriptional errors that result from this process are unintentional.

## 2018-09-25 NOTE — Plan of Care (Signed)
Heparin gtt continues to infuse, foley still in place for retention, on 1L O2. Only alert to self Problem: Clinical Measurements: Goal: Respiratory complications will improve Outcome: Progressing   Problem: Coping: Goal: Level of anxiety will decrease Outcome: Progressing   Problem: Elimination: Goal: Will not experience complications related to bowel motility Outcome: Progressing Note:  BM this shift 09/25/2018   Problem: Pain Managment: Goal: General experience of comfort will improve Outcome: Progressing Note:  No complaints of pain this shift   Problem: Safety: Goal: Ability to remain free from injury will improve Outcome: Progressing

## 2018-09-25 NOTE — Progress Notes (Signed)
[facility] Encounter Note  Patient: Holly Clark / Admit Date: 09/22/2018 / Date of Encounter: 09/25/2018, 1:13 PM   Subjective: Patient with much better heart rate control and telemetry appears to be in normal sinus rhythm at this time with adjustments of medication management.  Continued chronic kidney disease and worsening and will need adjustments of medication management to reduce chronic kidney disease.  Patient has had elevated troponin of 5.77 consistent with demand ischemia.  No other apparent significant symptoms requiring further intervention today  Review of Systems: Not able to assess Objective: Telemetry: Normal sinus rhythm Physical Exam: Blood pressure 140/73, pulse (!) 121, temperature 97.8 F (36.6 C), resp. rate 18, height 5\' 1"  (1.549 m), weight 42.9 kg, SpO2 99 %. Body mass index is 17.87 kg/m. General: Well developed, well nourished, in no acute distress. Head: Normocephalic, atraumatic, sclera non-icteric, no xanthomas, nares are without discharge. Neck: No apparent masses Lungs: Normal respirations with no wheezes, no rhonchi, no rales , no crackles   Heart: Regular rate and rhythm, normal S1 S2, no murmur, no rub, no gallop, PMI is normal size and placement, carotid upstroke normal without bruit, jugular venous pressure normal Abdomen: Soft, non-tender, non-distended with normoactive bowel sounds. No hepatosplenomegaly. Abdominal aorta is normal size without bruit Extremities: No edema, no clubbing, no cyanosis, no ulcers,  Peripheral: 2+ radial, 2+ femoral, 2+ dorsal pedal pulses Neuro: Not alert and oriented. Moves all extremities spontaneously. Psych: Does not responds to questions appropriately with a normal affect.   Intake/Output Summary (Last 24 hours) at 09/25/2018 1313 Last data filed at 09/25/2018 1058 Gross per 24 hour  Intake 768.34 ml  Output 900 ml  Net -131.66 ml    Inpatient Medications:  . apixaban  2.5 mg Oral BID   . aspirin EC  81 mg Oral Daily  . chlorhexidine  15 mL Mouth Rinse BID  . diltiazem  240 mg Oral Daily  . feeding supplement (ENSURE ENLIVE)  237 mL Oral BID  . furosemide  40 mg Intravenous Daily  . insulin aspart  0-15 Units Subcutaneous Q4H  . ipratropium-albuterol  3 mL Nebulization Q6H  . mouth rinse  15 mL Mouth Rinse q12n4p  . metoprolol tartrate  50 mg Oral BID  . multivitamin with minerals  1 tablet Oral Daily  . pantoprazole (PROTONIX) IV  40 mg Intravenous Q24H  . predniSONE  50 mg Oral Q breakfast  . sertraline  100 mg Oral Daily  . sodium chloride flush  3 mL Intravenous Q12H   Infusions:  . sodium chloride    . ampicillin (OMNIPEN) IV Stopped (09/25/18 0948)    Labs: Recent Labs    09/22/18 1708 09/23/18 0434 09/24/18 0552 09/25/18 0405  NA 142 142 146* 143  K 3.2* 3.3* 3.7 3.6  CL 101 102 103 100  CO2 28 28 29 29   GLUCOSE 183* 271* 130* 97  BUN 59* 57* 73* 82*  CREATININE 1.64* 1.71* 2.27* 2.26*  CALCIUM 9.1 8.8* 9.4 8.9  MG 2.5* 2.8*  --   --   PHOS 4.7* 3.8  --   --    Recent Labs    09/22/18 1708  AST 27  ALT 16  ALKPHOS 122  BILITOT 0.8  PROT 7.4  ALBUMIN 3.7   Recent Labs    09/22/18 1708  09/24/18 0552 09/25/18 0405  WBC 14.6*   < > 14.8* 15.3*  NEUTROABS 9.9*  --   --   --   HGB  12.2   < > 11.1* 11.1*  HCT 37.6   < > 35.4* 35.4*  MCV 90.2   < > 92.2 93.2  PLT 283   < > 335 356   < > = values in this interval not displayed.   Recent Labs    09/22/18 1708 09/22/18 2158 09/23/18 0434 09/23/18 1144  TROPONINI 0.77* 0.66* 0.59* 0.65*   Invalid input(s): POCBNP No results for input(s): HGBA1C in the last 72 hours.   Weights: Filed Weights   09/22/18 2018 09/24/18 0651 09/25/18 0425  Weight: 41.7 kg 41.3 kg 42.9 kg     Radiology/Studies:  Dg Chest Portable 1 View  Result Date: 09/22/2018 CLINICAL DATA:  85 year old female started on Lasix today for fluid overload. SVT. Cough. EXAM: PORTABLE CHEST 1 VIEW COMPARISON:   Chest radiographs 08/07/2018 and earlier. FINDINGS: Portable AP upright view at 1738 hours. Chronic hiatal hernia. Stable cardiac size and mediastinal contours. Prior CABG. Stable somewhat large lung volumes with no pneumothorax, pulmonary edema, pleural effusion or confluent pulmonary opacity. No acute osseous abnormality identified. IMPRESSION: No acute cardiopulmonary abnormality. Electronically Signed   By: Genevie Ann M.D.   On: 09/22/2018 18:21     Assessment and Recommendation  85 y.o. female with significant dementia having atrial fibrillation with rapid ventricular rate now appears to be spontaneously converted to normal sinus rhythm with good heart rate control with acute on chronic diastolic dysfunction heart failure multifactorial in nature including A. fib chronic kidney disease and anemia improving 1.  No further intervention [place] and or treatment of elevated troponin most consistent with demand ischemia rather than acute coronary syndrome 2.  Decrease Lasix due to concerns of acute on chronic kidney disease 3.  Continue diltiazem orally for heart rate control and maintenance of normal sinus rhythm with some beta-blocker for hypertension and heart rate control and maintenance of normal rhythm as well 4.  Anticoagulation for further risk reduction in stroke with atrial fibrillation with a Eliquis at 2.5 mg twice per day 5.  Ambulate if able following for any need for adjustments of medication management  Signed, Serafina Royals M.D. FACC

## 2018-09-25 NOTE — Progress Notes (Signed)
Physical Therapy Treatment Patient Details Name: Holly Clark MRN: [id number] DOB: 07/17/1929 Today's Date: 09/25/2018    History of Present Illness Holly Clark is an 85yo female who came to [facility] from [place] on 4/18 d/t irregular tachycardia. Pt had elevated BNP and elevated troponin, cardiology reporting inconsistent with ACS on 4/20.     PT Comments    Pt more awake (though still quite confused) and able to participate with PT this date, however needed constant encouragement and re-direction (easy enough to keep on track with consistent cuing) to participate.  She was able to maintain sitting balance with only CGA once assisted to upright sitting, however she needed a lot of assist to get to position.  Pt struggled with upright standing that appeared not only limited due to strength, but actual ROM available in ankle DF (L more limited than R).  Pt was able to participate with supine bed exercises, but needed constant cuing and re-direction with these as well.  Overall a much better performance than on the PT exam, but still very limited.      Follow Up Recommendations  SNF;Supervision/Assistance - 24 hour     Equipment Recommendations       Recommendations for Other Services       Precautions / Restrictions Precautions Precautions: Fall Restrictions Weight Bearing Restrictions: No    Mobility  Bed Mobility Overal bed mobility: Needs Assistance Bed Mobility: Supine to Sit;Sit to Supine     Supine to sit: Max assist Sit to supine: Max assist   General bed mobility comments: Pt was able to maintain sitting at EOB w/o direct phyiscal assist for >3 minutes today.  She reported feeling "wonky" initially but quickly felt okay and was able to maintain sitting balance (which she could not do yesterday during PT exam), however she struggled to even initiate getting to sitting but with some cuing gave some light assist (still required very signficant assist from  PT)  Transfers Overall transfer level: Needs assistance Equipment used: Rolling walker (2 wheeled) Transfers: Sit to/from Stand Sit to Stand: Max assist         General transfer comment: Pt unable to get hips fully forward (L ankle DF does not reach neutral) leaning legs on bed she was able to maintain standing with only min assist, but was max assist to keep from falling backward when attempting to not lean back on bed  Ambulation/Gait             General Gait Details: unable/unsafe to attempt, pt unable to get weight forward and over walker w/o max assist   Stairs             Wheelchair Mobility    Modified Rankin (Stroke Patients Only)       Balance Overall balance assessment: Needs assistance Sitting-balance support: Bilateral upper extremity supported;Feet supported Sitting balance-Leahy Scale: Fair Sitting balance - Comments: Once assisted to appropriate positioning at EOB she was able to maintain sitting balance with CGA but did not need direct assist     Standing balance-Leahy Scale: Zero Standing balance comment: unable to get ankles to DF neutral (L worse than R) and unable to appropriately shift weight forward onto walker despite heavy cuing and assist                            Cognition Arousal/Alertness: Awake/alert Behavior During Therapy: Anxious Overall Cognitive Status: Impaired/Different from baseline  Exercises General Exercises - Lower Extremity Ankle Circles/Pumps: PROM;10 reps;Both(gentle stretches for b/l ankles for DF range) Quad Sets: Strengthening;10 reps;Both Short Arc Quad: AROM;Strengthening;10 reps;Both Heel Slides: AROM;Strengthening;10 reps;Both Hip ABduction/ADduction: 10 reps;Both;AROM Straight Leg Raises: AAROM;10 reps;Both    General Comments        Pertinent Vitals/Pain Pain Assessment: No/denies pain    Home Living                       Prior Function            PT Goals (current goals can now be found in the care plan section) Progress towards PT goals: Progressing toward goals    Frequency    Min 2X/week      PT Plan Current plan remains appropriate    Co-evaluation              AM-PAC PT "6 Clicks" Mobility   Outcome Measure  Help needed turning from your back to your side while in a flat bed without using bedrails?: A Lot Help needed moving from lying on your back to sitting on the side of a flat bed without using bedrails?: A Lot Help needed moving to and from a bed to a chair (including a wheelchair)?: Total Help needed standing up from a chair using your arms (e.g., wheelchair or bedside chair)?: Total Help needed to walk in hospital room?: Total Help needed climbing 3-5 steps with a railing? : Total 6 Click Score: 8    End of Session Equipment Utilized During Treatment: Gait belt Activity Tolerance: Patient limited by fatigue(mental status limiting session) Patient left: with bed alarm set;with call bell/phone within reach   PT Visit Diagnosis: Muscle weakness (generalized) (M62.81);Other abnormalities of gait and mobility (R26.89)     Time: 6468-0321 PT Time Calculation (min) (ACUTE ONLY): 26 min  Charges:  $Therapeutic Exercise: 8-22 mins $Therapeutic Activity: 8-22 mins                     Kreg Shropshire, DPT 09/25/2018, 3:14 PM

## 2018-09-25 NOTE — Progress Notes (Signed)
ANTICOAGULATION CONSULT NOTE - Initial Consult  Pharmacy Consult for  Heparin  Indication: atrial fibrillation  Allergies  Allergen Reactions  . Hydrocodone Other (See Comments)  . Oxycodone Hives    Patient Measurements: Height: 5\' 1"  (154.9 cm) Weight: 94 lb 9.2 oz (42.9 kg) IBW/kg (Calculated) : 47.8 Heparin Dosing Weight:  43.8 kg   Vital Signs: Temp: 97.5 F (36.4 C) (04/21 0425) Temp Source: Oral (04/21 0425) BP: 132/58 (04/21 0425) Pulse Rate: 61 (04/21 0425)  Labs: Recent Labs    09/22/18 1900 09/22/18 2158  09/23/18 0434 09/23/18 1144 09/24/18 0552 09/24/18 1501 09/24/18 2305 09/25/18 0405  HGB  --   --   --  11.7*  --  11.1*  --   --  11.1*  HCT  --   --   --  36.4  --  35.4*  --   --  35.4*  PLT  --   --   --  253  --  335  --   --  356  APTT 34  --   --   --   --   --   --   --   --   LABPROT 15.0  --   --   --   --   --   --   --   --   INR 1.2  --   --   --   --   --   --   --   --   HEPARINUNFRC  --   --    < > 0.34 0.35 0.26* 0.54 0.47 0.55  CREATININE  --   --   --  1.71*  --  2.27*  --   --  2.26*  TROPONINI  --  0.66*  --  0.59* 0.65*  --   --   --   --    < > = values in this interval not displayed.    Estimated Creatinine Clearance: 12.1 mL/min (A) (by C-G formula based on SCr of 2.26 mg/dL (H)).   Medical History: Past Medical History:  Diagnosis Date  . Anxiety    unspecified  . Atrial fibrillation ([place])   . COPD (chronic obstructive pulmonary disease) ([place]) 10/05/2013  . Coronary artery disease 10/05/2013  . Dementia ([place])   . Depression   . Diastolic heart failure ([place])   . Hyperlipidemia, unspecified   . Hypertension 10/05/2013  . Hypothyroidism   . Hypothyroidism, acquired    unspecified  . PVD (peripheral vascular disease) ([place]) 10/05/2013   iliac stents    Medications:  Medications Prior to Admission  Medication Sig Dispense Refill Last Dose  . aspirin EC 81 MG tablet Take 81 mg by mouth daily.    09/22/2018 at 0800   . furosemide (LASIX) 40 MG tablet Take 40 mg by mouth daily.   09/22/2018 at 1500  . guaifenesin (ROBITUSSIN) 100 MG/5ML syrup Take 200 mg by mouth every 4 (four) hours as needed for cough.   09/22/2018 at 1013  . sertraline (ZOLOFT) 100 MG tablet Take 100 mg by mouth daily.    09/22/2018 at 0800  . Vitamin D, Ergocalciferol, (DRISDOL) 1.25 MG (50000 UT) CAPS capsule Take 50,000 Units by mouth every 30 (thirty) days.   09/12/2018 at 2000  . docusate sodium (COLACE) 100 MG capsule Take 1 capsule (100 mg total) by mouth 2 (two) times daily. 10 capsule 0 prn at prn  . feeding supplement, ENSURE ENLIVE, (ENSURE ENLIVE) LIQD Take 237 mLs  by mouth 2 (two) times daily. give with a meal for nutrition   Taking  . levothyroxine (SYNTHROID, LEVOTHROID) 75 MCG tablet Take 1 tablet (75 mcg total) by mouth daily before breakfast. (Patient not taking: Reported on 09/22/2018) 30 tablet 0 Not Taking at Unknown time  . nitroGLYCERIN (NITROSTAT) 0.4 MG SL tablet Place 0.4 mg under the tongue every 5 (five) minutes as needed for chest pain.   prn at prn  . Nutritional Supplements (NUTRITIONAL SUPPLEMENT PO) Diet Type: Regular consistency, NAS   Taking  . OXYGEN Inhale 2 L into the lungs continuous as needed.    Taking  . pantoprazole (PROTONIX) 40 MG tablet Take 1 tablet (40 mg total) by mouth 2 (two) times daily. (Patient not taking: Reported on 09/22/2018) 60 tablet 0 Not Taking at Unknown time  . polyethylene glycol (MIRALAX / GLYCOLAX) packet Take 17 g by mouth daily.   prn at prn    Assessment: Pharmacy consulted to dose heparin in this 85 year old female admitted with AFib.  No prior anticoag noted.  4/19 0434 HL 0.34 4/19 1144 HL 0.35 4/20 0552 HL 0.26 4/20 1501 HL 0.54  Goal of Therapy:  Heparin level 0.3-0.7 units/ml Monitor platelets by anticoagulation protocol: Yes   Plan:  04/21 @ 0400 HL 0.55 therapeutic. Will continue current rate and will recheck HL w/ am labs. CBC stable, will continue to  monitor.  Tobie Lords, PharmD, BCPS Clinical Pharmacist 09/25/2018 6:26 AM

## 2018-09-26 LAB — GLUCOSE, CAPILLARY
Glucose-Capillary: 106 mg/dL — ABNORMAL HIGH (ref 70–99)
Glucose-Capillary: 109 mg/dL — ABNORMAL HIGH (ref 70–99)
Glucose-Capillary: 89 mg/dL (ref 70–99)

## 2018-09-26 LAB — BASIC METABOLIC PANEL
Anion gap: 13 (ref 5–15)
BUN: 92 mg/dL — ABNORMAL HIGH (ref 8–23)
CO2: 31 mmol/L (ref 22–32)
Calcium: 9 mg/dL (ref 8.9–10.3)
Chloride: 101 mmol/L (ref 98–111)
Creatinine, Ser: 2.22 mg/dL — ABNORMAL HIGH (ref 0.44–1.00)
GFR calc Af Amer: 23 mL/min — ABNORMAL LOW (ref >60)
GFR calc non Af Amer: 19 mL/min — ABNORMAL LOW (ref >60)
Glucose, Bld: 121 mg/dL — ABNORMAL HIGH (ref 70–99)
Potassium: 3.9 mmol/L (ref 3.5–5.1)
Sodium: 145 mmol/L (ref 135–145)

## 2018-09-26 MED ORDER — LEVOFLOXACIN 500 MG PO TABS: 500.0000 mg | ORAL_TABLET | Freq: Every day | ORAL | 0 refills | Status: AC

## 2018-09-26 MED ORDER — PREDNISONE 10 MG PO TABS: 10.0000 mg | ORAL_TABLET | Freq: Every day | ORAL | 0 refills | Status: AC

## 2018-09-26 MED ORDER — SENNOSIDES-DOCUSATE SODIUM 8.6-50 MG PO TABS: 1.0000 | ORAL_TABLET | Freq: Every evening | ORAL | Status: AC | PRN

## 2018-09-26 MED ORDER — APIXABAN 2.5 MG PO TABS: 2.5000 mg | ORAL_TABLET | Freq: Two times a day (BID) | ORAL | 0 refills | Status: AC

## 2018-09-26 MED ORDER — ADULT MULTIVITAMIN W/MINERALS CH: 1.0000 | ORAL_TABLET | Freq: Every day | ORAL | Status: AC

## 2018-09-26 MED ORDER — METOPROLOL TARTRATE 50 MG PO TABS: 50.0000 mg | ORAL_TABLET | Freq: Two times a day (BID) | ORAL | 0 refills | Status: AC

## 2018-09-26 MED ORDER — DILTIAZEM HCL ER COATED BEADS 240 MG PO CP24: 240.0000 mg | ORAL_CAPSULE | Freq: Every day | ORAL | 0 refills | Status: AC

## 2018-09-26 MED ORDER — ONDANSETRON HCL 4 MG PO TABS: 4.0000 mg | ORAL_TABLET | Freq: Four times a day (QID) | ORAL | 0 refills | Status: AC | PRN

## 2018-09-26 NOTE — Discharge Planning (Signed)
Patient IV x2 and tele removed.  RN assessment and VS revealed stability for DC to SNF. Report called and s/w Daneil Dan, LPN @ [facility].  Discharge papers given, explained and educated.  Facility will call to schedule FU appts as able to transport. Printed needed scripts and placed in facility packet with signed [place] DNR.  EMS contacted to transport to room 302A - Waiting on arrival.

## 2018-09-26 NOTE — Plan of Care (Signed)
  Problem: Health Behavior/Discharge Planning: Goal: Ability to manage health-related needs will improve Outcome: Progressing   Problem: Clinical Measurements: Goal: Ability to maintain clinical measurements within normal limits will improve Outcome: Progressing Goal: Will remain free from infection Outcome: Progressing Goal: Respiratory complications will improve Outcome: Progressing Note:  No signs or symptoms of dyspnea or respiratory distress.

## 2018-09-26 NOTE — Progress Notes (Signed)
PT Cancellation Note  Patient Details Name: Holly Clark MRN: [id number] DOB: 1929/10/24   Cancelled Treatment:    Reason Eval/Treat Not Completed: Medical issues which prohibited therapy;Patient declined, no reason specified Patient peacefully sleeping this am, refuses therapy. RN reports patient has been disoriented and is returning to [facility] today, agrees to let patient rest as needed before [place] PT, DPT Shelton Silvas 09/26/2018, 10:04 AM

## 2018-09-26 NOTE — Progress Notes (Signed)
New referral for outpatient Palliative to follow at [facility] received from [place]. Patient information faxed to referral. Flo Shanks BSN, RN, [facility] Liaison [facility] [phone]

## 2018-09-26 NOTE — Discharge Summary (Signed)
[facility] NAME: Holly Clark    MR#:  [id number]  DATE OF BIRTH:  21-Mar-1930  DATE OF ADMISSION:  09/22/2018 ADMITTING PHYSICIAN: Saundra Shelling, MD  DATE OF DISCHARGE: 09/26/2018  PRIMARY CARE PHYSICIAN: Venia Carbon, MD    ADMISSION DIAGNOSIS:  Acute respiratory failure with hypoxia (HCC) [J96.01] Atrial fibrillation with RVR ([place]) [I48.91] Demand ischemia of myocardium (HCC) [I24.8]  DISCHARGE DIAGNOSIS:  Active Problems:   Atrial fibrillation with RVR (HCC)   Acute respiratory failure (HCC)   Demand ischemia of myocardium (HCC) Acute on chronic systolic heart failure Acute cystitis COVID negative  SECONDARY DIAGNOSIS:   Past Medical History:  Diagnosis Date  . Anxiety    unspecified  . Atrial fibrillation ([place])   . COPD (chronic obstructive pulmonary disease) ([place]) 10/05/2013  . Coronary artery disease 10/05/2013  . Dementia ([place])   . Depression   . Diastolic heart failure ([place])   . Hyperlipidemia, unspecified   . Hypertension 10/05/2013  . Hypothyroidism   . Hypothyroidism, acquired    unspecified  . PVD (peripheral vascular disease) ([place]) 10/05/2013   iliac stents    HOSPITAL COURSE:   HISTORY OF PRESENT ILLNESS: Holly Clark  is a 85 y.o. female with a known history of atrial fibrillation, COPD, diastolic heart failure, hypertension, hyperlipidemia, hypothyroidism, peripheral vascular disease presented to the emergency room with increased shortness of breath.  Patient is from the facility and DNR by [place].  Not a great historian has history of dementia.  She was put on BiPAP for respiratory distress and stabilized.  Patient was given 1 dose of Lasix for diuresis in the ER along with Solu-Medrol and nebulization therapy.  Her lactic acid level was elevated but chest x-ray did not reveal any pneumonia.  1 round of antibiotics were given in the emergency room.  Hospitalist service was  consulted.  1. Atrial fibrillation with RVR: Well controlled cardiology consultation placed, appreciate cardiology recommendations  Cardizem 60 mg p.o. every 6 hours changed to 240 mg extended release per day  Metoprolol dose increased from 25 to 50 mg we will continue close monitoring and titrate as needed will change heparin drip to p.o. Eliquis to be discharged  2.  Acute on chronic diastolic heart failure with AKI Continue Lasix dose reduced to 40 mg from 60 as  renal function is getting worse Monitor intake and output Nephrotoxins to be avoided Recheck BMP patient's baseline creatinine is at 1.8 currently at 2.2  3.  Acute on chronic hypoxic respiratory failure in the setting of acute CHF exacerbation: Wean to 2 L as tolerated Patient ruled out for COVID-19. Solu-Medrol changed to p.o. prednisone Clinically doing okay   4.  History of hypothyroidism not taking medications at this time with decreased TSH on labs: Free T3 1.3 below normal and free T4 1.81 slightly above normal PCP to consider repeating thyroid function tests in 6 weeks during follow-up visit  5.  Elevated troponin: Likely in the setting of demand ischemia from above issues Trending down  6.  Dementia: Watch for ICU delirium Consider adding Risperdal for agitation as needed  7.  Protein calorie malnutrition on feeding supplement  8.  Hypokalemia: Replace and recheck 3.7  9.  Urinary tract infection with Enterococcus faecalis Started Rocephin .  Changed to IV ampicillin based on the sensitivities and will change it to levofloxacin at the time of discharge  Outpatient palliative care follow-up in a week  Plan of care discussed with the patient's daughter [name] over phone she is agreeable with the  plan of care DISCHARGE CONDITIONS:   .fair  CONSULTS OBTAINED:  Treatment Team:  Yolonda Kida, MD   PROCEDURES none  DRUG ALLERGIES:   Allergies  Allergen Reactions  . Hydrocodone Other  (See Comments)  . Oxycodone Hives    DISCHARGE MEDICATIONS:   Allergies as of 09/26/2018      Reactions   Hydrocodone Other (See Comments)   Oxycodone Hives      Medication List    STOP taking these medications   levothyroxine 75 MCG tablet Commonly known as:  SYNTHROID   pantoprazole 40 MG tablet Commonly known as:  PROTONIX     TAKE these medications   apixaban 2.5 MG Tabs tablet Commonly known as:  ELIQUIS Take 1 tablet (2.5 mg total) by mouth 2 (two) times daily.   aspirin EC 81 MG tablet Take 81 mg by mouth daily.   diltiazem 240 MG 24 hr capsule Commonly known as:  CARDIZEM CD Take 1 capsule (240 mg total) by mouth daily.   docusate sodium 100 MG capsule Commonly known as:  COLACE Take 1 capsule (100 mg total) by mouth 2 (two) times daily.   feeding supplement (ENSURE ENLIVE) Liqd Take 237 mLs by mouth 2 (two) times daily. give with a meal for nutrition   NUTRITIONAL SUPPLEMENT PO Diet Type: Regular consistency, NAS   furosemide 40 MG tablet Commonly known as:  LASIX Take 40 mg by mouth daily.   guaifenesin 100 MG/5ML syrup Commonly known as:  ROBITUSSIN Take 200 mg by mouth every 4 (four) hours as needed for cough.   levofloxacin 500 MG tablet Commonly known as:  LEVAQUIN Take 1 tablet (500 mg total) by mouth daily for 7 days.   metoprolol tartrate 50 MG tablet Commonly known as:  LOPRESSOR Take 1 tablet (50 mg total) by mouth 2 (two) times daily.   multivitamin with minerals Tabs tablet Take 1 tablet by mouth daily.   nitroGLYCERIN 0.4 MG SL tablet Commonly known as:  NITROSTAT Place 0.4 mg under the tongue every 5 (five) minutes as needed for chest pain.   ondansetron 4 MG tablet Commonly known as:  ZOFRAN Take 1 tablet (4 mg total) by mouth every 6 (six) hours as needed for nausea.   OXYGEN Inhale 2 L into the lungs continuous as needed.   polyethylene glycol 17 g packet Commonly known as:  MIRALAX / GLYCOLAX Take 17 g by mouth  daily.   predniSONE 10 MG tablet Commonly known as:  DELTASONE Take 1 tablet (10 mg total) by mouth daily with breakfast for 5 days.   senna-docusate 8.6-50 MG tablet Commonly known as:  Senokot-S Take 1 tablet by mouth at bedtime as needed for mild constipation.   sertraline 100 MG tablet Commonly known as:  ZOLOFT Take 100 mg by mouth daily.   Vitamin D (Ergocalciferol) 1.25 MG (50000 UT) Caps capsule Commonly known as:  DRISDOL Take 50,000 Units by mouth every 30 (thirty) days.        DISCHARGE INSTRUCTIONS:   Follow-up with primary care physician at the facility in 3 days Please repeat BMP in a week Follow-up with cardiology Dr. Nehemiah Massed in 2 weeks Outpatient follow-up with the palliative care in a week  DIET:  Cardiac diet- Feeding assistance.  DISCHARGE CONDITION:  Fair  ACTIVITY:  Activity as tolerated  OXYGEN:  Home Oxygen: No.   Oxygen Delivery: room air  DISCHARGE LOCATION:  nursing home   If you experience worsening of your admission symptoms, develop shortness of breath, life threatening emergency, suicidal or homicidal thoughts you must seek medical attention immediately by calling 911 or calling your MD immediately  if symptoms less severe.  You Must read complete instructions/literature along with all the possible adverse reactions/side effects for all the Medicines you take and that have been prescribed to you. Take any new Medicines after you have completely understood and accpet all the possible adverse reactions/side effects.   Please note  You were cared for by a hospitalist during your hospital stay. If you have any questions about your discharge medications or the care you received while you were in the hospital after you are discharged, you can call the unit and asked to speak with the hospitalist on call if the hospitalist that took care of you is not available. Once you are discharged, your primary care physician will handle any further  medical issues. Please note that NO REFILLS for any discharge medications will be authorized once you are discharged, as it is imperative that you return to your primary care physician (or establish a relationship with a primary care physician if you do not have one) for your aftercare needs so that they can reassess your need for medications and monitor your lab values.     Today  Chief Complaint  Patient presents with  . Shortness of Breath     ROS:  ROS unobtainable as the patient is demented  VITAL SIGNS:  Blood pressure (!) 135/92, pulse 89, temperature 98.1 F (36.7 C), temperature source Oral, resp. rate 19, height 5\' 1"  (1.549 m), weight 41.6 kg, SpO2 93 %.  I/O:    Intake/Output Summary (Last 24 hours) at 09/26/2018 1058 Last data filed at 09/26/2018 0900 Gross per 24 hour  Intake 950 ml  Output 850 ml  Net 100 ml    PHYSICAL EXAMINATION:  GENERAL:  85 y.o.-year-old patient lying in the bed with no acute distress.  EYES: Pupils equal, round, reactive to light and accommodation. No scleral icterus. Extraocular muscles intact.  HEENT: Head atraumatic, normocephalic. Oropharynx and nasopharynx clear.  NECK:  Supple, no jugular venous distention. No thyroid enlargement, no tenderness.  LUNGS: Normal breath sounds bilaterally, no wheezing, rales,rhonchi or crepitation. No use of accessory muscles of respiration.  CARDIOVASCULAR: S1, S2 normal. No murmurs, rubs, or gallops.  ABDOMEN: Soft, non-tender, non-distended. Bowel sounds present.  EXTREMITIES: No pedal edema, cyanosis, or clubbing.  NEUROLOGIC: Awake, alert and disoriented sensation intact. Gait not checked.  PSYCHIATRIC: The patient is alert and disoriented from baseline dementia SKIN: No obvious rash, lesion, or ulcer.   DATA REVIEW:   CBC Recent Labs  Lab 09/25/18 0405  WBC 15.3*  HGB 11.1*  HCT 35.4*  PLT 356    Chemistries  Recent Labs  Lab 09/22/18 1708 09/23/18 0434  09/26/18 0346  NA 142  142   < > 145  K 3.2* 3.3*   < > 3.9  CL 101 102   < > 101  CO2 28 28   < > 31  GLUCOSE 183* 271*   < > 121*  BUN 59* 57*   < > 92*  CREATININE 1.64* 1.71*   < > 2.22*  CALCIUM 9.1 8.8*   < > 9.0  MG 2.5* 2.8*  --   --   AST 27  --   --   --   ALT 16  --   --   --  ALKPHOS 122  --   --   --   BILITOT 0.8  --   --   --    < > = values in this interval not displayed.    Cardiac Enzymes Recent Labs  Lab 09/23/18 1144  TROPONINI 0.65*    Microbiology Results  Results for orders placed or performed during the hospital encounter of 09/22/18  SARS Coronavirus 2 [facility] order, Performed in [facility] hospital lab)     Status: None   Collection Time: 09/22/18  5:08 PM  Result Value Ref Range Status   SARS Coronavirus 2 NEGATIVE NEGATIVE Final    Comment: (NOTE) If result is NEGATIVE SARS-CoV-2 target nucleic acids are NOT DETECTED. The SARS-CoV-2 RNA is generally detectable in upper and lower  respiratory specimens during the acute phase of infection. The lowest  concentration of SARS-CoV-2 viral copies this assay can detect is 250  copies / mL. A negative result does not preclude SARS-CoV-2 infection  and should not be used as the sole basis for treatment or other  patient management decisions.  A negative result may occur with  improper specimen collection / handling, submission of specimen other  than nasopharyngeal swab, presence of viral mutation(s) within the  areas targeted by this assay, and inadequate number of viral copies  (<250 copies / mL). A negative result must be combined with clinical  observations, patient history, and epidemiological information. If result is POSITIVE SARS-CoV-2 target nucleic acids are DETECTED. The SARS-CoV-2 RNA is generally detectable in upper and lower  respiratory specimens dur ing the acute phase of infection.  Positive  results are indicative of active infection with SARS-CoV-2.  Clinical  correlation with patient history and  other diagnostic information is  necessary to determine patient infection status.  Positive results do  not rule out bacterial infection or co-infection with other viruses. If result is PRESUMPTIVE POSTIVE SARS-CoV-2 nucleic acids MAY BE PRESENT.   A presumptive positive result was obtained on the submitted specimen  and confirmed on repeat testing.  While 2019 novel coronavirus  (SARS-CoV-2) nucleic acids may be present in the submitted sample  additional confirmatory testing may be necessary for epidemiological  and / or clinical management purposes  to differentiate between  SARS-CoV-2 and other Sarbecovirus currently known to infect humans.  If clinically indicated additional testing with an alternate test  methodology [phone]) is advised. The SARS-CoV-2 RNA is generally  detectable in upper and lower respiratory sp ecimens during the acute  phase of infection. The expected result is Negative. Fact Sheet for Patients:  StrictlyIdeas.no Fact Sheet for Healthcare Providers: BankingDealers.co.za This test is not yet approved or cleared by the [place] FDA and has been authorized for detection and/or diagnosis of SARS-CoV-2 by FDA under an Emergency Use Authorization (EUA).  This EUA will remain in effect (meaning this test can be used) for the duration of the COVID-19 declaration under Section 564(b)(1) of the Act, 21 U.S.C. section 360bbb-3(b)(1), unless the authorization is terminated or revoked sooner. Performed at [facility], [address] [place], [place] [address]   Blood culture (routine x 2)     Status: None (Preliminary result)   Collection Time: 09/22/18  5:11 PM  Result Value Ref Range Status   Specimen Description BLOOD LFA  Final   Special Requests   Final    BOTTLES DRAWN AEROBIC AND ANAEROBIC Blood Culture adequate volume   Culture   Final    NO GROWTH 4 DAYS Performed at  [facility],  [address], [place], [place] [address]    Report Status PENDING  Incomplete  Blood culture (routine x 2)     Status: None (Preliminary result)   Collection Time: 09/22/18  5:13 PM  Result Value Ref Range Status   Specimen Description BLOOD RFA  Final   Special Requests   Final    BOTTLES DRAWN AEROBIC AND ANAEROBIC Blood Culture results may not be optimal due to an excessive volume of blood received in culture bottles   Culture   Final    NO GROWTH 4 DAYS Performed at [facility], [address], [place], [place] [address]    Report Status PENDING  Incomplete  MRSA PCR Screening     Status: None   Collection Time: 09/22/18  8:24 PM  Result Value Ref Range Status   MRSA by PCR NEGATIVE NEGATIVE Final    Comment:        The GeneXpert MRSA Assay (FDA approved for NASAL specimens only), is one component of a comprehensive MRSA colonization surveillance program. It is not intended to diagnose MRSA infection nor to guide or monitor treatment for MRSA infections. Performed at [facility], [address], [place], [place] [address]   Urine Culture     Status: Abnormal   Collection Time: 09/22/18 11:59 PM  Result Value Ref Range Status   Specimen Description   Final    URINE, RANDOM Performed at [facility], [address] [place], [place] [address]    Special Requests   Final    NONE Performed at [facility], [place], [place] [address]    Culture 50,000 COLONIES/mL ENTEROCOCCUS FAECALIS (A)  Final   Report Status 09/25/2018 FINAL  Final   Organism ID, Bacteria ENTEROCOCCUS FAECALIS (A)  Final      Susceptibility   Enterococcus faecalis - MIC*    AMPICILLIN <=2 SENSITIVE Sensitive     LEVOFLOXACIN 1 SENSITIVE Sensitive     NITROFURANTOIN <=16 SENSITIVE Sensitive     VANCOMYCIN 2 SENSITIVE Sensitive     * 50,000 COLONIES/mL ENTEROCOCCUS FAECALIS    RADIOLOGY:  Dg Chest Portable 1 View  Result Date:  09/22/2018 CLINICAL DATA:  85 year old female started on Lasix today for fluid overload. SVT. Cough. EXAM: PORTABLE CHEST 1 VIEW COMPARISON:  Chest radiographs 08/07/2018 and earlier. FINDINGS: Portable AP upright view at 1738 hours. Chronic hiatal hernia. Stable cardiac size and mediastinal contours. Prior CABG. Stable somewhat large lung volumes with no pneumothorax, pulmonary edema, pleural effusion or confluent pulmonary opacity. No acute osseous abnormality identified. IMPRESSION: No acute cardiopulmonary abnormality. Electronically Signed   By: Genevie Ann M.D.   On: 09/22/2018 18:21    EKG:   Orders placed or performed during the hospital encounter of 09/22/18  . EKG 12-Lead  . EKG 12-Lead  . EKG 12-Lead  . EKG 12-Lead  . ED EKG 12-Lead  . ED EKG 12-Lead      Management plans discussed with the patient, family and they are in agreement.  CODE STATUS:     Code Status Orders  (From admission, onward)         Start     Ordered   09/22/18 1846  Do not attempt resuscitation (DNR)  Continuous    Question Answer Comment  In the event of cardiac or respiratory ARREST Do not call a "code blue"   In the event of cardiac or respiratory ARREST Do not perform Intubation, CPR,  defibrillation or ACLS   In the event of cardiac or respiratory ARREST Use medication by any route, position, wound care, and other measures to relive pain and suffering. May use oxygen, suction and manual treatment of airway obstruction as needed for comfort.      09/22/18 1845        Code Status History    Date Active Date Inactive Code Status Order ID Comments User Context   09/22/2018 1840 09/22/2018 1845 Full Code [id number]  Saundra Shelling, MD ED   07/23/2017 0319 07/26/2017 2030 Full Code [id number]  Amelia Jo, MD Inpatient   07/13/2017 1943 07/14/2017 2216 Full Code [id number]  Demetrios Loll, MD ED   07/07/2017 0338 07/11/2017 1921 Full Code [id number]  Saundra Shelling, MD Inpatient   05/19/2015 1534 05/22/2015 1854  Full Code [id number]  Hessie Knows, MD Inpatient   05/18/2015 1741 05/19/2015 1534 Full Code [id number]  Loletha Grayer, MD ED   05/18/2015 1658 05/18/2015 1741 Full Code [id number]  Hessie Knows, MD ED    Advance Directive Documentation     Most Recent Value  Type of Advance Directive  Out of facility DNR (pink MOST or yellow form)  Pre-existing out of facility DNR order (yellow form or pink MOST form)  -  "MOST" Form in Place?  -      TOTAL TIME TAKING CARE OF THIS PATIENT: 43  minutes.   Note: This dictation was prepared with Dragon dictation along with smaller phrase technology. Any transcriptional errors that result from this process are unintentional.   @[facility] @  on 09/26/2018 at 10:58 AM  Between 7am to 6pm - Pager - [phone]  After 6pm go to [email or web address] - password [email or web address] [facility]  Office  [phone]  CC: Primary care physician; Venia Carbon, MD

## 2018-09-26 NOTE — TOC Transition Note (Addendum)
Transition of Care [facility]) - CM/SW Discharge Note   Patient Details  Name: Holly Clark MRN: [id number] Date of Birth: 1930-03-03  [facility] CM/SW Contact:  Ross Ludwig, LCSW Phone Number: 09/26/2018, 12:32 PM   Clinical Narrative:   CSW received consult that patient will need palliative to follow at SNF, CSW made referral to [place] [facility].  Patient to be [facility] today [facility] room 302A.  Patient and family agreeable to plans will transport via ems RN to call report [phone].  CSW left message on voice mail of daughter Holly Clark [phone].     Final next level of care: Long Term Nursing Home Barriers to Discharge: Barriers Resolved   Patient Goals and CMS Choice Patient states their goals for this hospitalization and ongoing recovery are:: Patient's daughter reports that patient plans to return back to [facility] where she is a long term care resident. CMS Medicare.gov Compare Post Acute Care list provided to:: Patient Represenative (must comment)(Patient has some confusion and assessment completed by speaking to daughter.) Choice offered to / list presented to : Adult Children, [place] / Guardian  Discharge Placement   Existing PASRR number confirmed : 09/24/18          Patient chooses bed at: [facility] Patient to be transferred to facility by: [facility] Name of family member notified: Left message on patient's daughter Holly Clark's voice mail [phone] Patient and family notified of of transfer: 09/26/18  Discharge Plan and Services In-house Referral: Clinical Social Work Discharge Planning Services: NA Post Acute Care Choice: Nursing Home          DME Arranged: N/A DME Agency: NA HH Arranged: NA HH Agency: NA   Social Determinants of Health ([place]) Interventions     Readmission Risk Interventions Readmission Risk Prevention Plan 09/24/2018  Transportation Screening Complete  PCP or Specialist  Appt within 3-5 Days Complete  Social Work Consult for [place] Planning/Counseling Complete  Palliative Care Screening Complete  Medication Review Press photographer) Complete  Some recent data might be hidden

## 2018-09-26 NOTE — Progress Notes (Signed)
PALLIATIVE NOTE:  Patient lying in bed. Awake and alert. She denied pain or shortness of breath. Talkative and asking about family members and if she was in [place]. Seems somewhat more oriented to certain questions today however still with confusion. She reports she did not eat breakfast because she didn't feel like eating anything, but may want lunch. Cooperative and following commands.   Continues to have poor appetite and worsening renal function. Overall prognosis is poor. Family updated and aware she will be transferring back to facility soon. Daughter confirms [place] discussion on yesterday and wishes to have outpatient palliative for support with awareness she may speak with outpatient team and transition to hospice when they are ready.   Assessment-Awake, alert, baseline dementia, RRR, diminished bases, follows commands, scattered bruising  Plan-DNR, [place] with outpatient hospice support  The above conversation was completed via telephone due to the visitor restrictions during the COVID-19 pandemic. Thorough chart review and discussion with necessary members of the care team was completed as part of assessment.   Total Time: 25 min.   Greater than 50%  of this time was spent counseling and coordinating care related to the above assessment and plan

## 2018-09-26 NOTE — Discharge Instructions (Signed)
Follow-up with primary care physician at the facility in 3 days Please repeat BMP in a week Follow-up with cardiology Dr. Nehemiah Massed in 2 weeks Outpatient follow-up with the palliative care in a week

## 2018-09-27 ENCOUNTER — Telehealth: Payer: Medicare HMO | Admitting: Family

## 2018-09-27 DIAGNOSIS — I5032 Chronic diastolic (congestive) heart failure: Secondary | ICD-10-CM | POA: Diagnosis not present

## 2018-09-27 DIAGNOSIS — E441 Mild protein-calorie malnutrition: Secondary | ICD-10-CM

## 2018-09-27 DIAGNOSIS — I480 Paroxysmal atrial fibrillation: Secondary | ICD-10-CM | POA: Diagnosis not present

## 2018-09-27 DIAGNOSIS — L89302 Pressure ulcer of unspecified buttock, stage 2: Secondary | ICD-10-CM | POA: Diagnosis not present

## 2018-09-27 DIAGNOSIS — E039 Hypothyroidism, unspecified: Secondary | ICD-10-CM | POA: Diagnosis not present

## 2018-09-27 LAB — CULTURE, BLOOD (ROUTINE X 2)
Culture: NO GROWTH
Culture: NO GROWTH
Special Requests: ADEQUATE

## 2018-09-27 LAB — T4: T4, Total: 3.5 ug/dL — ABNORMAL LOW (ref 4.5–12.0)

## 2018-09-27 LAB — T3, FREE: T3, Free: 0.7 pg/mL — ABNORMAL LOW (ref 2.0–4.4)

## 2018-09-28 ENCOUNTER — Ambulatory Visit: Payer: Medicare HMO | Admitting: Internal Medicine

## 2018-10-01 DIAGNOSIS — N184 Chronic kidney disease, stage 4 (severe): Secondary | ICD-10-CM | POA: Diagnosis not present

## 2018-10-11 DIAGNOSIS — N184 Chronic kidney disease, stage 4 (severe): Secondary | ICD-10-CM | POA: Diagnosis not present

## 2018-10-11 DIAGNOSIS — E441 Mild protein-calorie malnutrition: Secondary | ICD-10-CM | POA: Diagnosis not present

## 2018-10-11 DIAGNOSIS — I5032 Chronic diastolic (congestive) heart failure: Secondary | ICD-10-CM | POA: Diagnosis not present

## 2018-10-11 DIAGNOSIS — F0150 Vascular dementia without behavioral disturbance: Secondary | ICD-10-CM | POA: Diagnosis not present

## 2018-10-11 DIAGNOSIS — I480 Paroxysmal atrial fibrillation: Secondary | ICD-10-CM | POA: Diagnosis not present

## 2018-10-15 DIAGNOSIS — N189 Chronic kidney disease, unspecified: Secondary | ICD-10-CM | POA: Diagnosis not present

## 2018-10-15 DIAGNOSIS — I130 Hypertensive heart and chronic kidney disease with heart failure and stage 1 through stage 4 chronic kidney disease, or unspecified chronic kidney disease: Secondary | ICD-10-CM | POA: Diagnosis not present

## 2018-10-15 DIAGNOSIS — D631 Anemia in chronic kidney disease: Secondary | ICD-10-CM | POA: Diagnosis not present

## 2018-10-25 DIAGNOSIS — E039 Hypothyroidism, unspecified: Secondary | ICD-10-CM | POA: Diagnosis not present

## 2018-11-06 ENCOUNTER — Telehealth: Payer: Medicare HMO | Admitting: Family

## 2018-11-07 DIAGNOSIS — I2510 Atherosclerotic heart disease of native coronary artery without angina pectoris: Secondary | ICD-10-CM

## 2018-11-07 DIAGNOSIS — E039 Hypothyroidism, unspecified: Secondary | ICD-10-CM | POA: Diagnosis not present

## 2018-11-07 DIAGNOSIS — I5030 Unspecified diastolic (congestive) heart failure: Secondary | ICD-10-CM | POA: Diagnosis not present

## 2018-11-07 DIAGNOSIS — J449 Chronic obstructive pulmonary disease, unspecified: Secondary | ICD-10-CM

## 2018-11-07 DIAGNOSIS — I10 Essential (primary) hypertension: Secondary | ICD-10-CM | POA: Diagnosis not present

## 2018-11-07 DIAGNOSIS — F0150 Vascular dementia without behavioral disturbance: Secondary | ICD-10-CM | POA: Diagnosis not present

## 2018-11-07 DIAGNOSIS — N184 Chronic kidney disease, stage 4 (severe): Secondary | ICD-10-CM | POA: Diagnosis not present

## 2018-11-07 DIAGNOSIS — F39 Unspecified mood [affective] disorder: Secondary | ICD-10-CM | POA: Diagnosis not present

## 2018-11-07 DIAGNOSIS — E43 Unspecified severe protein-calorie malnutrition: Secondary | ICD-10-CM | POA: Diagnosis not present

## 2018-11-26 DIAGNOSIS — E039 Hypothyroidism, unspecified: Secondary | ICD-10-CM | POA: Diagnosis not present

## 2018-12-06 DIAGNOSIS — F0150 Vascular dementia without behavioral disturbance: Secondary | ICD-10-CM

## 2018-12-06 DIAGNOSIS — E441 Mild protein-calorie malnutrition: Secondary | ICD-10-CM | POA: Diagnosis not present

## 2018-12-06 DIAGNOSIS — N184 Chronic kidney disease, stage 4 (severe): Secondary | ICD-10-CM | POA: Diagnosis not present

## 2018-12-06 DIAGNOSIS — J439 Emphysema, unspecified: Secondary | ICD-10-CM | POA: Diagnosis not present

## 2018-12-06 DIAGNOSIS — I480 Paroxysmal atrial fibrillation: Secondary | ICD-10-CM | POA: Diagnosis not present

## 2018-12-06 DIAGNOSIS — I5032 Chronic diastolic (congestive) heart failure: Secondary | ICD-10-CM | POA: Diagnosis not present

## 2019-01-01 DIAGNOSIS — B342 Coronavirus infection, unspecified: Secondary | ICD-10-CM | POA: Diagnosis not present

## 2019-02-12 DIAGNOSIS — L97919 Non-pressure chronic ulcer of unspecified part of right lower leg with unspecified severity: Secondary | ICD-10-CM | POA: Diagnosis not present

## 2019-02-13 DIAGNOSIS — E46 Unspecified protein-calorie malnutrition: Secondary | ICD-10-CM | POA: Diagnosis not present

## 2019-02-13 DIAGNOSIS — I5030 Unspecified diastolic (congestive) heart failure: Secondary | ICD-10-CM | POA: Diagnosis not present

## 2019-02-13 DIAGNOSIS — E43 Unspecified severe protein-calorie malnutrition: Secondary | ICD-10-CM | POA: Diagnosis not present

## 2019-02-13 DIAGNOSIS — I700 Atherosclerosis of aorta: Secondary | ICD-10-CM

## 2019-02-13 DIAGNOSIS — I739 Peripheral vascular disease, unspecified: Secondary | ICD-10-CM | POA: Diagnosis not present

## 2019-02-13 DIAGNOSIS — F0150 Vascular dementia without behavioral disturbance: Secondary | ICD-10-CM | POA: Diagnosis not present

## 2019-02-13 DIAGNOSIS — I2510 Atherosclerotic heart disease of native coronary artery without angina pectoris: Secondary | ICD-10-CM | POA: Diagnosis not present

## 2019-02-13 DIAGNOSIS — N184 Chronic kidney disease, stage 4 (severe): Secondary | ICD-10-CM

## 2019-02-13 DIAGNOSIS — E039 Hypothyroidism, unspecified: Secondary | ICD-10-CM | POA: Diagnosis not present

## 2019-02-13 DIAGNOSIS — F39 Unspecified mood [affective] disorder: Secondary | ICD-10-CM | POA: Diagnosis not present

## 2019-02-13 DIAGNOSIS — I4891 Unspecified atrial fibrillation: Secondary | ICD-10-CM | POA: Diagnosis not present

## 2019-02-13 DIAGNOSIS — J449 Chronic obstructive pulmonary disease, unspecified: Secondary | ICD-10-CM

## 2019-02-23 DIAGNOSIS — R0602 Shortness of breath: Secondary | ICD-10-CM | POA: Diagnosis not present

## 2019-02-25 DIAGNOSIS — J181 Lobar pneumonia, unspecified organism: Secondary | ICD-10-CM | POA: Diagnosis not present

## 2019-02-25 DIAGNOSIS — C3431 Malignant neoplasm of lower lobe, right bronchus or lung: Secondary | ICD-10-CM | POA: Diagnosis not present

## 2019-02-26 DIAGNOSIS — L89616 Pressure-induced deep tissue damage of right heel: Secondary | ICD-10-CM

## 2019-04-07 DEATH — deceased
